# Patient Record
Sex: Female | Born: 1935 | Race: White | Hispanic: No | Smoking: Former smoker
Health system: Southern US, Community
[De-identification: ages and names within clinical notes are randomized; demographics above are authoritative.]

## PROBLEM LIST (undated history)

## (undated) DIAGNOSIS — E119 Type 2 diabetes mellitus without complications: Secondary | ICD-10-CM

## (undated) DIAGNOSIS — I739 Peripheral vascular disease, unspecified: Secondary | ICD-10-CM

## (undated) DIAGNOSIS — I10 Essential (primary) hypertension: Secondary | ICD-10-CM

## (undated) DIAGNOSIS — E079 Disorder of thyroid, unspecified: Secondary | ICD-10-CM

## (undated) DIAGNOSIS — R0602 Shortness of breath: Secondary | ICD-10-CM

## (undated) DIAGNOSIS — I209 Angina pectoris, unspecified: Secondary | ICD-10-CM

## (undated) DIAGNOSIS — I509 Heart failure, unspecified: Secondary | ICD-10-CM

## (undated) DIAGNOSIS — B029 Zoster without complications: Secondary | ICD-10-CM

## (undated) DIAGNOSIS — R413 Other amnesia: Secondary | ICD-10-CM

## (undated) DIAGNOSIS — E785 Hyperlipidemia, unspecified: Secondary | ICD-10-CM

## (undated) DIAGNOSIS — C801 Malignant (primary) neoplasm, unspecified: Secondary | ICD-10-CM

## (undated) DIAGNOSIS — I2510 Atherosclerotic heart disease of native coronary artery without angina pectoris: Secondary | ICD-10-CM

## (undated) DIAGNOSIS — M1990 Unspecified osteoarthritis, unspecified site: Secondary | ICD-10-CM

## (undated) DIAGNOSIS — E214 Other specified disorders of parathyroid gland: Secondary | ICD-10-CM

## (undated) DIAGNOSIS — E039 Hypothyroidism, unspecified: Secondary | ICD-10-CM

## (undated) DIAGNOSIS — N186 End stage renal disease: Secondary | ICD-10-CM

## (undated) DIAGNOSIS — G4730 Sleep apnea, unspecified: Secondary | ICD-10-CM

## (undated) DIAGNOSIS — I639 Cerebral infarction, unspecified: Secondary | ICD-10-CM

## (undated) DIAGNOSIS — I219 Acute myocardial infarction, unspecified: Secondary | ICD-10-CM

## (undated) DIAGNOSIS — R51 Headache: Secondary | ICD-10-CM

## (undated) HISTORY — DX: Essential (primary) hypertension: I10

## (undated) HISTORY — DX: Unspecified osteoarthritis, unspecified site: M19.90

## (undated) HISTORY — DX: Type 2 diabetes mellitus without complications: E11.9

## (undated) HISTORY — DX: Heart failure, unspecified: I50.9

## (undated) HISTORY — PX: OTHER SURGICAL HISTORY: SHX169

## (undated) HISTORY — PX: APPENDECTOMY: SHX54

## (undated) HISTORY — PX: CATARACT EXTRACTION: SUR2

## (undated) HISTORY — DX: End stage renal disease: N18.6

## (undated) HISTORY — PX: CARDIAC CATHETERIZATION: SHX172

## (undated) HISTORY — PX: EYE SURGERY: SHX253

## (undated) HISTORY — DX: Hypercalcemia: E83.52

## (undated) HISTORY — DX: Hyperlipidemia, unspecified: E78.5

## (undated) HISTORY — DX: Acute myocardial infarction, unspecified: I21.9

## (undated) HISTORY — DX: Disorder of thyroid, unspecified: E07.9

---

## 2003-08-30 HISTORY — PX: CHOLECYSTECTOMY: SHX55

## 2003-08-30 HISTORY — PX: ABDOMINAL HYSTERECTOMY: SHX81

## 2003-08-30 HISTORY — PX: NEPHRECTOMY: SHX65

## 2004-05-08 ENCOUNTER — Inpatient Hospital Stay (HOSPITAL_COMMUNITY): Admission: AD | Admit: 2004-05-08 | Discharge: 2004-05-14 | Payer: Self-pay | Admitting: *Deleted

## 2010-06-05 ENCOUNTER — Inpatient Hospital Stay (HOSPITAL_COMMUNITY): Admission: EM | Admit: 2010-06-05 | Discharge: 2010-06-07 | Payer: Self-pay | Admitting: Internal Medicine

## 2010-10-28 DIAGNOSIS — I219 Acute myocardial infarction, unspecified: Secondary | ICD-10-CM

## 2010-10-28 HISTORY — DX: Acute myocardial infarction, unspecified: I21.9

## 2010-10-28 HISTORY — PX: CORONARY STENT PLACEMENT: SHX1402

## 2010-11-10 LAB — BASIC METABOLIC PANEL
BUN: 34 mg/dL — ABNORMAL HIGH (ref 6–23)
BUN: 51 mg/dL — ABNORMAL HIGH (ref 6–23)
Calcium: 8.5 mg/dL (ref 8.4–10.5)
Calcium: 9.8 mg/dL (ref 8.4–10.5)
GFR calc Af Amer: 20 mL/min — ABNORMAL LOW (ref >60)
GFR calc Af Amer: 21 mL/min — ABNORMAL LOW (ref >60)
GFR calc non Af Amer: 17 mL/min — ABNORMAL LOW (ref >60)
GFR calc non Af Amer: 17 mL/min — ABNORMAL LOW (ref >60)
GFR calc non Af Amer: 25 mL/min — ABNORMAL LOW (ref >60)
Glucose, Bld: 136 mg/dL — ABNORMAL HIGH (ref 70–99)
Potassium: 4 mEq/L (ref 3.5–5.1)
Potassium: 4.6 mEq/L (ref 3.5–5.1)
Sodium: 137 mEq/L (ref 135–145)

## 2010-11-10 LAB — GLUCOSE, CAPILLARY
Glucose-Capillary: 117 mg/dL — ABNORMAL HIGH (ref 70–99)
Glucose-Capillary: 207 mg/dL — ABNORMAL HIGH (ref 70–99)
Glucose-Capillary: 258 mg/dL — ABNORMAL HIGH (ref 70–99)
Glucose-Capillary: 260 mg/dL — ABNORMAL HIGH (ref 70–99)
Glucose-Capillary: 264 mg/dL — ABNORMAL HIGH (ref 70–99)

## 2010-11-10 LAB — LIPID PANEL
Cholesterol: 115 mg/dL (ref 0–200)
HDL: 37 mg/dL — ABNORMAL LOW (ref >39)
LDL Cholesterol: 54 mg/dL (ref 0–99)
Total CHOL/HDL Ratio: 3.1 RATIO
Triglycerides: 122 mg/dL (ref <150)

## 2010-11-10 LAB — CARDIAC PANEL(CRET KIN+CKTOT+MB+TROPI)
CK, MB: 1.1 ng/mL (ref 0.3–4.0)
CK, MB: 1.2 ng/mL (ref 0.3–4.0)
CK, MB: 2.1 ng/mL (ref 0.3–4.0)
Relative Index: INVALID (ref 0.0–2.5)
Total CK: 50 U/L (ref 7–177)
Total CK: 60 U/L (ref 7–177)

## 2010-11-10 LAB — CBC
HCT: 37.6 % (ref 36.0–46.0)
MCH: 29.4 pg (ref 26.0–34.0)
MCHC: 33.2 g/dL (ref 30.0–36.0)
Platelets: 144 10*3/uL — ABNORMAL LOW (ref 150–400)
Platelets: 159 10*3/uL (ref 150–400)
RBC: 4.38 MIL/uL (ref 3.87–5.11)
RDW: 14.3 % (ref 11.5–15.5)
RDW: 14.3 % (ref 11.5–15.5)
WBC: 7.8 10*3/uL (ref 4.0–10.5)

## 2010-11-10 LAB — TSH: TSH: 5.108 u[IU]/mL — ABNORMAL HIGH (ref 0.350–4.500)

## 2010-11-10 LAB — URINE CULTURE
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
## 2011-01-14 NOTE — Discharge Summary (Signed)
[name], [name] NO.:  [id number]   MEDICAL RECORD NO.:  [id number]          PATIENT TYPE:  INP   LOCATION:  2905                         FACILITY:  [facility]   PHYSICIAN:  Vida Roller, M.D.   DATE OF BIRTH:  1935-07-25   DATE OF ADMISSION:  05/08/2004  DATE OF DISCHARGE:  05/14/2004                                 DISCHARGE SUMMARY   DISCHARGE DIAGNOSES:  Takotsubo syndrome/coronary artery disease.   INTERVENTIONAL CARDIOLOGIST:  Dr. Dow Adolph MEDICATIONS:  1.  Aspirin 325 mg daily.  2.  Lipitor 40 mg daily.  3.  Glyburide 5 mg daily.  4.  Plavix 75 mg daily.  5.  Isosorbide mononitrate 50 mg daily.  6.  Lopressor 25 mg t.i.d.  7.  Tylenol 325 mg one or two tablets q.4-6h. as required.  8.  Xanax 0.25 mg twice as required.  9.  Lantus insulin 20 a.m. in the morning and 30 units in the evening.   PROBLEM LIST:  1.  Takotsubo syndrome.  2.  Coronary artery disease.  3.  Diabetes.  4.  Hypertension.  5.  History of kidney cancer status post left nephrectomy in 2003.  6.  History of hysterectomy for carcinoma uterus.  Status post hysterectomy      in 2003.  7.  History of cholecystectomy 2003.  8.  History of appendectomy.  9.  Peripheral vascular disease.   PROCEDURE:  Patient was catheterized first time on September 10.  She got  left heart catheterization with selective coronary arteriography and  selective left ventriculography.  Conclusion of first catheterization was  significant wall motion abnormality involving the distal anterior,  anteroapical, and distal inferior segments, bifurcational disease involving  the left anterior descending diagonal 2 and left anterior descending artery.  Patient was recatheterized on September 14.  The coronary artery disease was  persistent with improvement in wall motion abnormality and improved ejection  fraction from 45 on the previous to 57.  Patient got 2-D echocardiogram on  September 15.   Ejection fraction 55-65%, improved wall motion abnormality.   COURSE DURING ADMISSION IN THE HOSPITAL:  Patient presented with complaint  of chest discomfort for two days and associated with shortness of breath,  diaphoresis, dizziness, extreme fatigability, orthopnea the previous night  of admission.  Also, patient had history of nausea, vomiting, and diarrhea  one day prior to admission.  She was transferred by Dr. Sherlyn Lick for urgent  evaluation.  Patient had suffered some recent emotional trauma and was  admitted with chest pain with positive enzymes.  EKG consistent with T-wave  in the anterolateral and inferolateral leads and echocardiogram was  consistent with severe LV dysfunction with only the base moving well.  She  was considered to have post infarction angina with recurrent chest pain.  The BNP was significantly elevated and was referred for further evaluation  to [facility]   LABORATORIES:  On admission troponin was 1.65.  BNP 1120.  Sodium 137,  potassium 4.2, chloride 109, bicarbonate 18, BUN 46, creatinine 1.6, sugar  150, calcium 9.9.  Hemoglobin 13.3, WBC 12.7.  SGOT 47, SGPT 69, albumin  3.4.   ASSESSMENT/PLAN:  Coronary artery disease.  The patient had very large wall  motion abnormality.  This wall motion abnormality seems to be some extent  larger than the vascular territory supplied by the left anterior descending  diagonal distribution.  Nonetheless, it was possible that this could be  responsible.  Also, the patient had history of emotional distress so the  patient was diagnosed with Takotsubo, that is, stress induced cardiomyopathy  where there is vasospasm of coronary vessels.  Patient got recatheter study  on September 14 which showed improved wall motion abnormality, improved  ejection fraction.  Her catheterization films were discussed with Dr. Dorethea Clan  and Dr. Dorris Fetch and there was unanimous decision to treat her medically.  It was assumed that she did  not need PCI or bypass graft right now.  Medical  treatment was optimized and patient was discharged to follow up with Dr.  Sherlyn Lick next week on September 22 and with Dr. Steva Ready the following week.      Jeffr   SP/MEDQ  D:  05/14/2004  T:  05/16/2004  Job:  [id number]   cc:   Sharyne Peach  [address]  [place]  [place] [address]  Fax: [phone]   Harl Bowie, M.D.  [address]  [place]  [place] [address]  Fax: [phone]   Arturo Morton. Riley Kill, M.D. [facility]

## 2011-01-14 NOTE — Cardiovascular Report (Signed)
NAMEGREYDIS, STLOUIS                        ACCOUNT NO.:  [id number]   MEDICAL RECORD NO.:  [id number]                   PATIENT TYPE:  INP   LOCATION:  2926                                 FACILITY:  [facility]   PHYSICIAN:  Arturo Morton. Riley Kill, M.D. [facility]         DATE OF BIRTH:  Aug 26, 1935   DATE OF PROCEDURE:  05/12/2004  DATE OF DISCHARGE:                              CARDIAC CATHETERIZATION   INDICATIONS:  Ms. [name] is a delightful 76 year old woman who presented  with chest pain after severe emotional distress.  She had severe reduction  in left ventricular function leading to transfer for catheterization with  recurrent angina.  Catheterization revealed a moderate bifurcational  stenosis of the mid left anterior descending artery at the second diagonal  takeoff and a fairly large wall motion abnormality with calculated ejection  fraction of 45% at that point.  There were marked diffuse T-wave inversions.  Repeat cardiac catheterization was recommended at that time.  We were  uncertain as to whether this represented a specific focal wall motion  abnormality in the LAD diagonal territory or whether, in fact, it suggested  more of a Takayasu presentation.  Given the clinical scenario and the size  of the wall motion abnormality, we were clearly concerned about Takayasu as  a primary etiologic cause.  We elected to treat her medically.  She has done  well.  She was brought back to the catheterization laboratory to reevaluate  the LAD.  We had her seen by the cardiovascular surgeons with a specific eye  towards possible revascularization if that would be necessary.  The restudy  was done to reassess the LAD and the wall motion at this point in time.   PROCEDURE:  1.  Left heart catheterization.  2.  Selective coronary arteriography.  3.  Selective left ventriculography.   DESCRIPTION OF PROCEDURE:  The patient was brought to the catheterization  laboratory and prepped and draped in  the usual fashion.  Through an anterior  puncture the left femoral artery was entered.  A 5-French sheath was placed.  Central aortic and left ventricular pressures were measured with a pigtail.  Ventriculography was performed in the RAO projection.  Coronary arteriograms  were performed of the left coronary artery.  Intracoronary nitroglycerin was  then administered.  All catheters were subsequently removed.  The patient  was taken to the holding area in satisfactory clinical condition.  I then  reviewed the films with Dr. Nanetta Batty.  Initial views suggested that  the LAD itself was 60-70% as well as the diagonal.  The wall motion itself  was clearly improved with an ejection fraction now calculated at 57% but  there continues to be distal inferior hypokinesis with what appears to be  beyond the territory of the LAD alone.  I plan to review these with the  cardiovascular surgeons, specifically, Dr. Dorris Fetch.   HEMODYNAMIC DATA:  1.  Central aorta 177/71.  2.  Left ventricle 160/26.  3.  No gradient on pullback across the aortic valve.   ANGIOGRAPHIC DATA:  1.  Ventriculography was performed in the RAO projection.  There did not      appear to be significant mitral regurgitation.  EF was calculated at      57%.  2.  The left main coronary artery was free of critical disease.  3.  The LAD is fairly heavily calcified proximally with some mild plaquing      throughout the proximal LAD.  At the second LAD diagonal bifurcation      there is a specific area of abnormality involving both branches.      However, both appear to have a relatively smooth appearance with no more      than about 60-70% narrowing.  It is difficult to speculate that the LAD      itself accounts for the wall motion abnormality.  The distal LAD and      diagonal are both moderate sized vessels.  4.  The circumflex provides several marginal branches and has mild plaquing      in the mid vessel which is about  30%.  This is eccentric and basically      unchanged from the acute study.  The distal marginal is fairly large      caliber vessel.  5.  Right coronary arteriography was not performed as it was not felt to be      necessary.   CONCLUSIONS:  1.  Compared to the previous study wall motion is improved, but still      involves the distal inferior wall.  2.  There is bifurcational disease of the left anterior descending and      diagonal branch but it appears to be 60 to perhaps 70.  It is not clear      that this represents flow-limiting disease.   DISPOSITION:  I plan to review the films carefully with Dr. Dorris Fetch  before making a final disposition.  Her heparin will be discontinued.  She  will be treated medically at the present time.  Further decision will be  pending review with the surgeons.                                               Arturo Morton. Riley Kill, M.D. [facility]    TDS/MEDQ  D:  05/12/2004  T:  05/12/2004  Job:  [id number]   cc:   Sharyne Peach  [address]  [place]  [place] [address]  Fax: [phone]   Harl Bowie, M.D.  [address]  [place]  [place] [address]  Fax: [phone]   CV Lab

## 2011-01-14 NOTE — Cardiovascular Report (Signed)
NAMESCHERRIE, [name]                        ACCOUNT NO.:  [id number]   MEDICAL RECORD NO.:  [id number]                   PATIENT TYPE:  INP   LOCATION:  2926                                 FACILITY:  [facility]   PHYSICIAN:  Arturo Morton. Riley Kill, M.D. [facility]         DATE OF BIRTH:  [birth year]   DATE OF PROCEDURE:  DATE OF DISCHARGE:                              CARDIAC CATHETERIZATION   DATE OF SERVICE:  May 08, 2004.   SURGEON:  Arturo Morton. Riley Kill, MD, [facility].   INDICATIONS:  Ms. [name] is a 76 year old woman transferred by Dr. Sherlyn Lick  for urgent evaluation.  The patient suffered some recent emotional trauma  and then was admitted with chest pain and positive enzymes.  EKG was  consistent with T-wave inversion in the anterolateral and inferolateral  leads, and echo was consistent with severe LV dysfunction with only the base  moving well.  She was considered to have post-infarction angina with  recurrent chest pain, the BNP was significantly elevated, and she was  referred for further evaluation.   PROCEDURE:  1.  Left heart catheterization.  2.  Selective coronary arteriography.  3.  Selective left ventriculography.   DESCRIPTION OF PROCEDURE:  The patient was brought to the cath lab and  prepped and draped in the usual fashion.  Through an anterior puncture, the  right femoral artery was easily entered.  A 6-French sheath was placed.  Plans were made to greatly limit the amount of contrast.  We took 2 views of  the right coronary artery and then views of the left coronary artery.  There  was LAD D2 bifurcational disease.  Whether this was significant was  difficult to determine, so I elected to perform a ventriculogram.  The  ejection fraction that had been reported at 20% by echocardiography seemed  out of proportion to the findings by angiography, so both RAO and LAO  ventriculography were done with 35 mL total.  This demonstrated a large wall  motion abnormality.  Ejection  fraction was calculated at 45%.  There was mid  and distal, anterolateral, apical, and distal inferior akinesis, which  appeared to be, to some extent, out of proportion to the LAD diagonal  bifurcational disease.  With this, the procedure was completed.  The femoral  sheath was sewn into place.  The patient was taken to the CCU in  satisfactory clinical condition for continued monitoring.   HEMODYNAMIC DATA:  1.  Aortic 114/74.  2.  Left ventricle 106/4.  3.  No gradient on pullback across the aortic valve.   ANGIOGRAPHIC DATA:  1.  On plain fluoroscopy, there is evidence of some calcification involving      largely the left anterior descending artery.  2.  Ventriculography was performed in the RAO projection.  The mid distal,      anterolateral, apical, and distal inferior wall appeared to move poorly.      There was hyperdynamic motion of  the anterior base and inferior base.      Ejection fraction was calculated, therefore, at 45%.  There was perhaps      trace mitral regurgitation, but it did not appear to be severe.  In the      LAO ventriculogram, there was a large, global area mostly involving the      inferoapical segment.  3.  The right coronary artery was a large, dominant vessel with segmental      plaquing of about 30% of the proximal vessel.  It is a very large      caliber vessel supplying a number of sub-branches of the posterior      descending artery and a fair-sized posterolateral branch.  4.  The left main is free of critical disease.  5.  The circumflex consists of two marginal branches, and the circumflex      proper is really free of significant disease.  6.  The left anterior descending artery provides a proximal diagonal branch,      which takes the course of an intermediate vessel.  This intermediate      vessel has mild luminal irregularity, but no evidence of high-grade      focal stenosis.  Following this, there is a lesion that involves the LAD       diagonal 2 bifurcation.  There is not much disease prior to the      bifurcation, but at the bifurcation both vessels have about 70-75%      narrowing.  There is TIMI-3 flow in the distal vessel.   CONCLUSIONS:  1.  Significant wall motion abnormality involving the distal anterior,      anteroapical, and distal inferior segments.  2.  Bifurcational disease involving the LAD diagonal 2 and LAD.   DISCUSSION:  The patient has a very large wall motion abnormality.  This  wall motion abnormality seems to be, to some extent, larger than the  vascular territory supplied by the LAD diagonal 2 distribution.  Nonetheless, it is possible that this could be responsible, although I doubt  this is the case.  Furthermore, the patient provides a history of rather  severe emotional distress.  This could represent a __________ presentation  with a large anteroapical wall motion abnormality in the setting of  emotional distress.  At the present time, we will monitor her closely in the  CCU.  Importantly, the patient's creatinine is elevated, we used 61 mL of  total contrast, her LVEDP is low, and we will give her normal saline bolus  to try to reduce post-procedural change in renal function.  If she does  well, we will potentially plan to study her later in the week to see if the  LAD diagonal is less severe, and also to see if, in fact, the anterior wall  has recovered.  I have asked Dr. Andrey Spearman of the surgical service  to see her as she may need revascularization surgery with the LAD diagonal  disease.  We will get his opinion, and we will follow her closely.                                               Arturo Morton. Riley Kill, M.D. [facility]    TDS/MEDQ  D:  05/08/2004  T:  05/09/2004  Job:  [id number]   cc:   Jacklynn Lewis  Sandhu  [address]  [place]  [place] [address]  Fax: [phone]   Harl Bowie, M.D.  [address]  [place]  [place] [address]  Fax: [phone]   CV Laboratory

## 2011-01-14 NOTE — Consult Note (Signed)
NAMECHISTINE, DEMATTEO                        ACCOUNT NO.:  [id number]   MEDICAL RECORD NO.:  [id number]                   PATIENT TYPE:  INP   LOCATION:  2926                                 FACILITY:  [facility]   PHYSICIAN:  Salvatore Decent. Dorris Fetch, M.D.         DATE OF BIRTH:  1935/07/08   DATE OF CONSULTATION:  05/09/2004  DATE OF DISCHARGE:                                   CONSULTATION   REASON FOR CONSULTATION:  Coronary artery disease.   HISTORY OF PRESENT ILLNESS:  Ms. Mcclaran is a 76 year old white female who  presented with a complaint of chest pain.  She had been having symptoms on  and off for two days prior to presentation to her doctor who sent her  immediately to the emergency room.  There she was noted to have diffuse ST  changes and ruled in for myocardial infarction by enzymes.  An  echocardiogram performed there showed severe hypokinesis to akinesis in the  left ventricle with exception of the base of the heart.  She also had mild  mitral regurgitation and mild pulmonary hypertension.  Her peak troponin was  1.95.  She was transferred to [facility] for cardiac catheterization which  showed only moderate disease in the LAD and second diagonal branches of 70  to 75%. These were very smooth lesions. The LAD itself was relatively small  vessel and did not reach the apex of the heart.  The large dominant right  and left circumflex had no significant disease.   PAST MEDICAL HISTORY:  1.  Adult-onset type 2 diabetes mellitus.  2.  Hypertension.  3.  Hypercholesterolemia.  4.  History of left nephrectomy for renal cell carcinoma.  5.  History of hysterectomy.  6.  History of cholecystectomy.  7.  History of appendectomy.   MEDICATIONS AT TIME OF ADMISSION:  1.  Metformin 1000 mg p.o. b.i.d.  2.  Glucophage 10 mg every other day.  3.  Lantus insulin.  4.  Metoprolol 50 mg p.o. b.i.d.  5.  Lisinopril 10 mg daily.  6.  Oxybutynin 5 mg b.i.d.  7.  Lipitor 40 mg  daily.   ALLERGIES:  No known drug allergies.   FAMILY HISTORY:  Significant for coronary artery disease at an early age in  mother, brother, and sister.   SOCIAL HISTORY:  She has a 70-pack-year history, smoking two packs per day,  quit 15 years ago,   PHYSICAL EXAMINATION:  GENERAL:  Ms. Koop is a well-appearing 69-year-  old white female who is currently in no acute distress.  She is well-  developed, well-nourished, and mildly obese.  VITAL SIGNS: Blood pressure 110/48, pulse 70 and regular, respirations 16.  NEUROLOGIC:  Alert and oriented x 3 and grossly intact.  HEENT:  Unremarkable.  CARDIAC:  Regular rate and rhythm, normal S1 and S2.  No rub or murmur.  LUNGS: Clear with equal breath sounds bilaterally.  ABDOMEN:  Soft, nontender.  EXTREMITIES:  Without clubbing, cyanosis, or edema, 2+ pulses throughout.  SKIN:  Warm and dry.   LABORATORY AND X-RAY DATA:  Echocardiogram and catheterization as previously  mentioned.   Hematocrit 32, white count 7.7.  Sodium 138, potassium 4.3, BUN 34,  creatinine 1.6, glucose 173.  Peak CK-MB was 9.5.  Peak troponin was 1.95.  Urinalysis was normal.  BNP on admission was 1120.   IMPRESSION:  Ms. Flippo is a 76 year old lady who presented with an acute  myocardial infarction.  Her electrocardiographic changes were very diffuse,  and her echocardiogram showed severe wall motion abnormalities out of  proportion to her coronary artery disease.  She really only has moderate  disease in her left anterior descending and diagonal, neither of which are  huge vessels.  There is no significant disease in the right coronary artery  or left circumflex.   I am in agreement with Dr. Riley Kill that her clinical picture is not  consistent with these catheterization findings which raises concern of a  vasospastic or arteritis type of phenomenon.  I agree with his plan for re-  catheterization, and will follow this patient with him.                                                Salvatore Decent Dorris Fetch, M.D.    SCH/MEDQ  D:  05/09/2004  T:  05/09/2004  Job:  [id number]   cc:   Arturo Morton. Riley Kill, M.D. [facility]

## 2011-11-11 ENCOUNTER — Other Ambulatory Visit (HOSPITAL_COMMUNITY): Payer: Self-pay | Admitting: Endocrinology

## 2011-11-11 DIAGNOSIS — E213 Hyperparathyroidism, unspecified: Secondary | ICD-10-CM

## 2011-11-24 ENCOUNTER — Encounter (HOSPITAL_COMMUNITY): Payer: Self-pay

## 2012-12-07 DIAGNOSIS — I509 Heart failure, unspecified: Secondary | ICD-10-CM

## 2012-12-07 HISTORY — DX: Heart failure, unspecified: I50.9

## 2012-12-12 ENCOUNTER — Other Ambulatory Visit: Payer: Self-pay | Admitting: *Deleted

## 2012-12-12 DIAGNOSIS — N184 Chronic kidney disease, stage 4 (severe): Secondary | ICD-10-CM

## 2012-12-12 DIAGNOSIS — Z01810 Encounter for preprocedural cardiovascular examination: Secondary | ICD-10-CM

## 2012-12-13 ENCOUNTER — Encounter: Payer: Self-pay | Admitting: Vascular Surgery

## 2013-01-07 ENCOUNTER — Encounter: Payer: Self-pay | Admitting: Vascular Surgery

## 2013-01-08 ENCOUNTER — Encounter: Payer: Self-pay | Admitting: Vascular Surgery

## 2013-01-08 ENCOUNTER — Encounter (INDEPENDENT_AMBULATORY_CARE_PROVIDER_SITE_OTHER): Payer: Medicare Other | Admitting: *Deleted

## 2013-01-08 ENCOUNTER — Other Ambulatory Visit: Payer: Self-pay

## 2013-01-08 ENCOUNTER — Ambulatory Visit (INDEPENDENT_AMBULATORY_CARE_PROVIDER_SITE_OTHER): Payer: Medicare Other | Admitting: Vascular Surgery

## 2013-01-08 ENCOUNTER — Encounter (HOSPITAL_COMMUNITY): Payer: Self-pay | Admitting: Pharmacy Technician

## 2013-01-08 VITALS — BP 177/65 | HR 60 | Resp 14 | Ht 61.0 in | Wt 203.0 lb

## 2013-01-08 DIAGNOSIS — N184 Chronic kidney disease, stage 4 (severe): Secondary | ICD-10-CM

## 2013-01-08 DIAGNOSIS — Z01810 Encounter for preprocedural cardiovascular examination: Secondary | ICD-10-CM

## 2013-01-08 DIAGNOSIS — N186 End stage renal disease: Secondary | ICD-10-CM

## 2013-01-08 MED ORDER — DEXTROSE 5 % IV SOLN
1.5000 g | INTRAVENOUS | Status: AC
  Administered 2013-01-09: 1.5 g via INTRAVENOUS
  Filled 2013-01-08: qty 1.5

## 2013-01-08 NOTE — Progress Notes (Signed)
I was not able to reach patient.I left instructions on her voice mail for arrival time of 0700 , [facility] Short Stay, Entrance A.  Npo after midnight .  Meds to take with water in AM:  Norvasc, Neurotin, Toprol , Imdur, Ranexa, Synthyroid, Allopurinol.  No lotions, powders, cologne, no valuables.  Number to call for questions- [phone].

## 2013-01-08 NOTE — Progress Notes (Signed)
Subjective:     Patient ID: [name], female   DOB: 08-Jul-1935, 78 y.o.   MRN: [id number]  HPI this 78 year old female was referred by Dr. Elvis Coil for evaluation of vascular access. She has diabetes mellitus. She is right-handed. She has never had hemodialysis in the past. She did have a cardiac stent placed one year ago. She denies any active chest pain.  Past Medical History  Diagnosis Date  . Diabetes mellitus without complication   . ESRD (end stage renal disease)     Renal insufficiency  . Hypertension   . Heart failure December 07, 2012    Right side  . Hypercalcemia     2nd to hyperparathyroidism  . Arthritis     Gout  . Myocardial infarction   . Thyroid disease     Hyperparathyroidism  . Thyroid disease     Hypothyroidism  . Dyslipidemia   . CHF (congestive heart failure)     History  Substance Use Topics  . Smoking status: Former Smoker    Types: Cigarettes    Quit date: 01/08/1998  . Smokeless tobacco: Never Used  . Alcohol Use: No    Family History  Problem Relation Age of Onset  . Diabetes Sister   . Heart disease Sister     before age 48  . Hyperlipidemia Sister   . Hypertension Sister   . Diabetes Sister   . Hyperlipidemia Sister   . Hypertension Sister     Allergies not on file  Current outpatient prescriptions:albuterol (PROVENTIL) (2.5 MG/3ML) 0.083% nebulizer solution, Take 2.5 mg by nebulization every 6 (six) hours as needed for wheezing., Disp: , Rfl: ;  allopurinol (ZYLOPRIM) 100 MG tablet, Take 100 mg by mouth daily., Disp: , Rfl: ;  amLODipine (NORVASC) 2.5 MG tablet, Take 2.5 mg by mouth daily., Disp: , Rfl: ;  aspirin 81 MG tablet, Take 81 mg by mouth daily., Disp: , Rfl:  atorvastatin (LIPITOR) 80 MG tablet, Take 80 mg by mouth daily., Disp: , Rfl: ;  clotrimazole-betamethasone (LOTRISONE) cream, Apply topically continuous as needed., Disp: , Rfl: ;  ergocalciferol (VITAMIN D2) 50000 UNITS capsule, Take 50,000 Units by mouth once a  week., Disp: , Rfl: ;  febuxostat (ULORIC) 40 MG tablet, Take 80 mg by mouth daily., Disp: , Rfl: ;  furosemide (LASIX) 20 MG tablet, Take 20 mg by mouth daily., Disp: , Rfl:  gabapentin (NEURONTIN) 400 MG capsule, Take 400 mg by mouth 3 (three) times daily., Disp: , Rfl: ;  insulin aspart (NOVOLOG) 100 UNIT/ML injection, Inject into the skin 2 (two) times daily. 10 units am - 20 units pm, Disp: , Rfl: ;  Insulin Glargine (LANTUS [place]), Inject 60 Units into the skin at bedtime., Disp: , Rfl: ;  isosorbide mononitrate (IMDUR) 60 MG 24 hr tablet, Take 60 mg by mouth daily., Disp: , Rfl:  Levothyroxine Sodium 75 MCG CAPS, Take by mouth daily before breakfast., Disp: , Rfl: ;  metoprolol succinate (TOPROL-XL) 50 MG 24 hr tablet, Take 50 mg by mouth daily. Take with or immediately following a meal., Disp: , Rfl: ;  nitroGLYCERIN (NITROSTAT) 0.3 MG SL tablet, Place 0.3 mg under the tongue continuous as needed for chest pain., Disp: , Rfl: ;  Omega-3 Fatty Acids (FISH OIL) 1200 MG CAPS, Take 1,200 mg by mouth daily., Disp: , Rfl:  Oxybutynin (OXYTROL TD), Place onto the skin every three (3) days as needed. Every 4 days patch, Disp: , Rfl: ;  ranolazine (RANEXA)  500 MG 12 hr tablet, Take 500 mg by mouth 2 (two) times daily., Disp: , Rfl: ;  solifenacin (VESICARE) 5 MG tablet, Take 10 mg by mouth daily., Disp: , Rfl: ;  Ticagrelor (BRILINTA PO), Take 90 mg by mouth 2 (two) times daily., Disp: , Rfl:   BP   Ht 5\' 1"  (1.549 m)  Wt 203 lb (92.08 kg)  BMI 38.38 kg/m2  Body mass index is 38.38 kg/(m^2).          Review of Systems denies chest pain, dyspnea on exertion, PND, orthopnea. His have distal edema bilaterally. Takes Brilinta twice a day for cardiac stent     Objective:   Physical Exam BP   Ht 5\' 1"  (1.549 m)  Wt 203 lb (92.08 kg)  BMI 38.38 kg/m2 Blood pressure 177/65 heart rate 60 respirations 14 Gen.-alert and oriented x3 in no apparent distress HEENT normal for age Lungs no rhonchi or  wheezing Cardiovascular regular rhythm no murmurs carotid pulses 3+ palpable no bruits audible Abdomen soft nontender no palpable masses Musculoskeletal free of  major deformities Skin clear -no rashes Neurologic normal Lower extremities 3+ femoral pulses bilaterally. No distal pulses palpable. 1+ edema bilaterally. Bilateral upper extremities with 3+ brachial and radial pulses palpable. Left antecubital vein visible.  Today I ordered bilateral upper extremity vein mapping which I reviewed and interpreted. It does appear that the left cephalic and basilic veins are satisfactory for fistula creation. I independently performed a sono site and side ultrasound exam confirmed the above findings      Assessment:     End-stage renal disease needs vascular access-right-handed    Plan:     Plan left brachial to cephalic A-V fistula tomorrow Will hold Brilinta today and tomorrow and then resume on Thursday

## 2013-01-09 ENCOUNTER — Encounter (HOSPITAL_COMMUNITY): Payer: Self-pay | Admitting: *Deleted

## 2013-01-09 ENCOUNTER — Encounter (HOSPITAL_COMMUNITY): Payer: Self-pay | Admitting: Anesthesiology

## 2013-01-09 ENCOUNTER — Ambulatory Visit (HOSPITAL_COMMUNITY): Payer: Medicare Other

## 2013-01-09 ENCOUNTER — Ambulatory Visit (HOSPITAL_COMMUNITY): Payer: Medicare Other | Admitting: Anesthesiology

## 2013-01-09 ENCOUNTER — Ambulatory Visit (HOSPITAL_COMMUNITY)
Admission: RE | Admit: 2013-01-09 | Discharge: 2013-01-09 | Disposition: A | Discharge status: Home / Self Care | Payer: Medicare Other | Source: Ambulatory Visit | Attending: Vascular Surgery | Admitting: Vascular Surgery

## 2013-01-09 ENCOUNTER — Encounter (HOSPITAL_COMMUNITY)
Admission: RE | Disposition: A | Discharge status: Home / Self Care | Payer: Self-pay | Source: Ambulatory Visit | Attending: Vascular Surgery

## 2013-01-09 ENCOUNTER — Other Ambulatory Visit: Payer: Self-pay | Admitting: *Deleted

## 2013-01-09 ENCOUNTER — Telehealth: Payer: Self-pay | Admitting: Vascular Surgery

## 2013-01-09 DIAGNOSIS — E785 Hyperlipidemia, unspecified: Secondary | ICD-10-CM | POA: Insufficient documentation

## 2013-01-09 DIAGNOSIS — Z794 Long term (current) use of insulin: Secondary | ICD-10-CM | POA: Insufficient documentation

## 2013-01-09 DIAGNOSIS — Z87891 Personal history of nicotine dependence: Secondary | ICD-10-CM | POA: Insufficient documentation

## 2013-01-09 DIAGNOSIS — E039 Hypothyroidism, unspecified: Secondary | ICD-10-CM | POA: Insufficient documentation

## 2013-01-09 DIAGNOSIS — I509 Heart failure, unspecified: Secondary | ICD-10-CM | POA: Insufficient documentation

## 2013-01-09 DIAGNOSIS — N186 End stage renal disease: Secondary | ICD-10-CM

## 2013-01-09 DIAGNOSIS — Z9861 Coronary angioplasty status: Secondary | ICD-10-CM | POA: Insufficient documentation

## 2013-01-09 DIAGNOSIS — Z79899 Other long term (current) drug therapy: Secondary | ICD-10-CM | POA: Insufficient documentation

## 2013-01-09 DIAGNOSIS — I120 Hypertensive chronic kidney disease with stage 5 chronic kidney disease or end stage renal disease: Secondary | ICD-10-CM | POA: Insufficient documentation

## 2013-01-09 DIAGNOSIS — E119 Type 2 diabetes mellitus without complications: Secondary | ICD-10-CM | POA: Insufficient documentation

## 2013-01-09 DIAGNOSIS — Z7982 Long term (current) use of aspirin: Secondary | ICD-10-CM | POA: Insufficient documentation

## 2013-01-09 DIAGNOSIS — I252 Old myocardial infarction: Secondary | ICD-10-CM | POA: Insufficient documentation

## 2013-01-09 DIAGNOSIS — Z6838 Body mass index (BMI) 38.0-38.9, adult: Secondary | ICD-10-CM | POA: Insufficient documentation

## 2013-01-09 DIAGNOSIS — Z4931 Encounter for adequacy testing for hemodialysis: Secondary | ICD-10-CM

## 2013-01-09 DIAGNOSIS — M109 Gout, unspecified: Secondary | ICD-10-CM | POA: Insufficient documentation

## 2013-01-09 DIAGNOSIS — E213 Hyperparathyroidism, unspecified: Secondary | ICD-10-CM | POA: Insufficient documentation

## 2013-01-09 HISTORY — PX: AV FISTULA PLACEMENT: SHX1204

## 2013-01-09 LAB — PROTIME-INR
INR: 0.94 (ref 0.00–1.49)
Prothrombin Time: 12.5 seconds (ref 11.6–15.2)

## 2013-01-09 LAB — POCT I-STAT 4, (NA,K, GLUC, HGB,HCT)
HCT: 41 % (ref 36.0–46.0)
Hemoglobin: 13.9 g/dL (ref 12.0–15.0)

## 2013-01-09 LAB — GLUCOSE, CAPILLARY: Glucose-Capillary: 146 mg/dL — ABNORMAL HIGH (ref 70–99)

## 2013-01-09 SURGERY — ARTERIOVENOUS (AV) FISTULA CREATION
Anesthesia: Monitor Anesthesia Care | Site: Arm Upper | Laterality: Left | Wound class: Clean

## 2013-01-09 MED ORDER — LIDOCAINE-EPINEPHRINE (PF) 1 %-1:200000 IJ SOLN
INTRAMUSCULAR | Status: AC
  Filled 2013-01-09: qty 10

## 2013-01-09 MED ORDER — FENTANYL CITRATE 0.05 MG/ML IJ SOLN
INTRAMUSCULAR | Status: DC | PRN
  Administered 2013-01-09: 50 ug via INTRAVENOUS

## 2013-01-09 MED ORDER — LIDOCAINE-EPINEPHRINE (PF) 1 %-1:200000 IJ SOLN
INTRAMUSCULAR | Status: DC | PRN
  Administered 2013-01-09: 23 mL

## 2013-01-09 MED ORDER — OXYCODONE HCL 5 MG PO TABS: 5.0000 mg | ORAL_TABLET | ORAL | Status: DC | PRN

## 2013-01-09 MED ORDER — TICAGRELOR 90 MG PO TABS: 90.0000 mg | ORAL_TABLET | Freq: Two times a day (BID) | ORAL | Status: DC

## 2013-01-09 MED ORDER — OXYCODONE HCL 5 MG PO TABS: 5.0000 mg | ORAL_TABLET | Freq: Once | ORAL | Status: DC | PRN

## 2013-01-09 MED ORDER — MUPIROCIN 2 % EX OINT
TOPICAL_OINTMENT | CUTANEOUS | Status: AC
  Filled 2013-01-09: qty 22

## 2013-01-09 MED ORDER — MIDAZOLAM HCL 5 MG/5ML IJ SOLN
INTRAMUSCULAR | Status: DC | PRN
  Administered 2013-01-09: 2 mg via INTRAVENOUS

## 2013-01-09 MED ORDER — 0.9 % SODIUM CHLORIDE (POUR BTL) OPTIME
TOPICAL | Status: DC | PRN
  Administered 2013-01-09: 1000 mL

## 2013-01-09 MED ORDER — MUPIROCIN 2 % EX OINT
TOPICAL_OINTMENT | Freq: Two times a day (BID) | CUTANEOUS | Status: DC
  Administered 2013-01-09: 1 via NASAL

## 2013-01-09 MED ORDER — SODIUM CHLORIDE 0.9 % IV SOLN
INTRAVENOUS | Status: DC
  Administered 2013-01-09: 11:00:00 via INTRAVENOUS

## 2013-01-09 MED ORDER — HYDROMORPHONE HCL PF 1 MG/ML IJ SOLN: 0.2500 mg | INTRAMUSCULAR | Status: DC | PRN

## 2013-01-09 MED ORDER — PROPOFOL INFUSION 10 MG/ML OPTIME
INTRAVENOUS | Status: DC | PRN
  Administered 2013-01-09: 50 ug/kg/min via INTRAVENOUS

## 2013-01-09 MED ORDER — OXYCODONE HCL 5 MG/5ML PO SOLN: 5.0000 mg | Freq: Once | ORAL | Status: DC | PRN

## 2013-01-09 MED ORDER — PROMETHAZINE HCL 25 MG/ML IJ SOLN: 6.2500 mg | INTRAMUSCULAR | Status: DC | PRN

## 2013-01-09 MED ORDER — SODIUM CHLORIDE 0.9 % IR SOLN
Status: DC | PRN
  Administered 2013-01-09: 12:00:00

## 2013-01-09 SURGICAL SUPPLY — 35 items
ADH SKN CLS APL DERMABOND .7 (GAUZE/BANDAGES/DRESSINGS) ×1
ARMBAND PINK RESTRICT EXTREMIT (MISCELLANEOUS) ×2 IMPLANT
CANISTER SUCTION 2500CC (MISCELLANEOUS) ×2 IMPLANT
CATH EMB 3FR 80CM (CATHETERS) ×1 IMPLANT
CLIP TI MEDIUM 6 (CLIP) ×2 IMPLANT
CLIP TI WIDE RED SMALL 6 (CLIP) ×3 IMPLANT
CLOTH BEACON ORANGE TIMEOUT ST (SAFETY) ×2 IMPLANT
COVER PROBE W GEL 5X96 (DRAPES) ×1 IMPLANT
COVER SURGICAL LIGHT HANDLE (MISCELLANEOUS) ×2 IMPLANT
DERMABOND ADVANCED (GAUZE/BANDAGES/DRESSINGS) ×1
DERMABOND ADVANCED .7 DNX12 (GAUZE/BANDAGES/DRESSINGS) ×1 IMPLANT
ELECT REM PT RETURN 9FT ADLT (ELECTROSURGICAL) ×2
ELECTRODE REM PT RTRN 9FT ADLT (ELECTROSURGICAL) ×1 IMPLANT
GEL ULTRASOUND 20GR AQUASONIC (MISCELLANEOUS) ×1 IMPLANT
GLOVE BIO SURGEON STRL SZ 6.5 (GLOVE) ×2 IMPLANT
GLOVE BIOGEL PI IND STRL 7.0 (GLOVE) IMPLANT
GLOVE BIOGEL PI INDICATOR 7.0 (GLOVE) ×2
GLOVE SS BIOGEL STRL SZ 6.5 (GLOVE) IMPLANT
GLOVE SS BIOGEL STRL SZ 7 (GLOVE) ×1 IMPLANT
GLOVE SUPERSENSE BIOGEL SZ 6.5 (GLOVE) ×1
GLOVE SUPERSENSE BIOGEL SZ 7 (GLOVE) ×1
GOWN STRL NON-REIN LRG LVL3 (GOWN DISPOSABLE) ×6 IMPLANT
KIT BASIN OR (CUSTOM PROCEDURE TRAY) ×2 IMPLANT
KIT ROOM TURNOVER OR (KITS) ×2 IMPLANT
NS IRRIG 1000ML POUR BTL (IV SOLUTION) ×2 IMPLANT
PACK CV ACCESS (CUSTOM PROCEDURE TRAY) ×2 IMPLANT
PAD ARMBOARD 7.5X6 YLW CONV (MISCELLANEOUS) ×4 IMPLANT
SUT PROLENE 6 0 BV (SUTURE) ×3 IMPLANT
SUT VIC AB 3-0 SH 27 (SUTURE) ×2
SUT VIC AB 3-0 SH 27X BRD (SUTURE) ×1 IMPLANT
SYR TB 1ML LUER SLIP (SYRINGE) ×1 IMPLANT
TOWEL OR 17X24 6PK STRL BLUE (TOWEL DISPOSABLE) ×2 IMPLANT
TOWEL OR 17X26 10 PK STRL BLUE (TOWEL DISPOSABLE) ×2 IMPLANT
UNDERPAD 30X30 INCONTINENT (UNDERPADS AND DIAPERS) ×2 IMPLANT
WATER STERILE IRR 1000ML POUR (IV SOLUTION) ×2 IMPLANT

## 2013-01-09 NOTE — H&P (View-Only) (Signed)
Subjective:     Patient ID: [name], female   DOB: 07/29/1935, 78 y.o.   MRN: [id number]  HPI this 78-year-old female was referred by Dr. Martin Webb for evaluation of vascular access. She has diabetes mellitus. She is right-handed. She has never had hemodialysis in the past. She did have a cardiac stent placed one year ago. She denies any active chest pain.  Past Medical History  Diagnosis Date  . Diabetes mellitus without complication   . ESRD (end stage renal disease)     Renal insufficiency  . Hypertension   . Heart failure December 07, 2012    Right side  . Hypercalcemia     2nd to hyperparathyroidism  . Arthritis     Gout  . Myocardial infarction   . Thyroid disease     Hyperparathyroidism  . Thyroid disease     Hypothyroidism  . Dyslipidemia   . CHF (congestive heart failure)     History  Substance Use Topics  . Smoking status: Former Smoker    Types: Cigarettes    Quit date: 01/08/1998  . Smokeless tobacco: Never Used  . Alcohol Use: No    Family History  Problem Relation Age of Onset  . Diabetes Sister   . Heart disease Sister     before age 60  . Hyperlipidemia Sister   . Hypertension Sister   . Diabetes Sister   . Hyperlipidemia Sister   . Hypertension Sister     Allergies not on file  Current outpatient prescriptions:albuterol (PROVENTIL) (2.5 MG/3ML) 0.083% nebulizer solution, Take 2.5 mg by nebulization every 6 (six) hours as needed for wheezing., Disp: , Rfl: ;  allopurinol (ZYLOPRIM) 100 MG tablet, Take 100 mg by mouth daily., Disp: , Rfl: ;  amLODipine (NORVASC) 2.5 MG tablet, Take 2.5 mg by mouth daily., Disp: , Rfl: ;  aspirin 81 MG tablet, Take 81 mg by mouth daily., Disp: , Rfl:  atorvastatin (LIPITOR) 80 MG tablet, Take 80 mg by mouth daily., Disp: , Rfl: ;  clotrimazole-betamethasone (LOTRISONE) cream, Apply topically continuous as needed., Disp: , Rfl: ;  ergocalciferol (VITAMIN D2) 50000 UNITS capsule, Take 50,000 Units by mouth once a  week., Disp: , Rfl: ;  febuxostat (ULORIC) 40 MG tablet, Take 80 mg by mouth daily., Disp: , Rfl: ;  furosemide (LASIX) 20 MG tablet, Take 20 mg by mouth daily., Disp: , Rfl:  gabapentin (NEURONTIN) 400 MG capsule, Take 400 mg by mouth 3 (three) times daily., Disp: , Rfl: ;  insulin aspart (NOVOLOG) 100 UNIT/ML injection, Inject into the skin 2 (two) times daily. 10 units am - 20 units pm, Disp: , Rfl: ;  Insulin Glargine (LANTUS [place]), Inject 60 Units into the skin at bedtime., Disp: , Rfl: ;  isosorbide mononitrate (IMDUR) 60 MG 24 hr tablet, Take 60 mg by mouth daily., Disp: , Rfl:  Levothyroxine Sodium 75 MCG CAPS, Take by mouth daily before breakfast., Disp: , Rfl: ;  metoprolol succinate (TOPROL-XL) 50 MG 24 hr tablet, Take 50 mg by mouth daily. Take with or immediately following a meal., Disp: , Rfl: ;  nitroGLYCERIN (NITROSTAT) 0.3 MG SL tablet, Place 0.3 mg under the tongue continuous as needed for chest pain., Disp: , Rfl: ;  Omega-3 Fatty Acids (FISH OIL) 1200 MG CAPS, Take 1,200 mg by mouth daily., Disp: , Rfl:  Oxybutynin (OXYTROL TD), Place onto the skin every three (3) days as needed. Every 4 days patch, Disp: , Rfl: ;  ranolazine (RANEXA)   500 MG 12 hr tablet, Take 500 mg by mouth 2 (two) times daily., Disp: , Rfl: ;  solifenacin (VESICARE) 5 MG tablet, Take 10 mg by mouth daily., Disp: , Rfl: ;  Ticagrelor (BRILINTA PO), Take 90 mg by mouth 2 (two) times daily., Disp: , Rfl:   BP   Ht 5' 1" (1.549 m)  Wt 203 lb (92.08 kg)  BMI 38.38 kg/m2  Body mass index is 38.38 kg/(m^2).          Review of Systems denies chest pain, dyspnea on exertion, PND, orthopnea. His have distal edema bilaterally. Takes Brilinta twice a day for cardiac stent     Objective:   Physical Exam BP   Ht 5' 1" (1.549 m)  Wt 203 lb (92.08 kg)  BMI 38.38 kg/m2 Blood pressure 177/65 heart rate 60 respirations 14 Gen.-alert and oriented x3 in no apparent distress HEENT normal for age Lungs no rhonchi or  wheezing Cardiovascular regular rhythm no murmurs carotid pulses 3+ palpable no bruits audible Abdomen soft nontender no palpable masses Musculoskeletal free of  major deformities Skin clear -no rashes Neurologic normal Lower extremities 3+ femoral pulses bilaterally. No distal pulses palpable. 1+ edema bilaterally. Bilateral upper extremities with 3+ brachial and radial pulses palpable. Left antecubital vein visible.  Today I ordered bilateral upper extremity vein mapping which I reviewed and interpreted. It does appear that the left cephalic and basilic veins are satisfactory for fistula creation. I independently performed a sono site and side ultrasound exam confirmed the above findings      Assessment:     End-stage renal disease needs vascular access-right-handed    Plan:     Plan left brachial to cephalic A-V fistula tomorrow Will hold Brilinta today and tomorrow and then resume on Thursday      

## 2013-01-09 NOTE — Interval H&P Note (Signed)
History and Physical Interval Note:  01/09/2013 10:40 AM  [name]  has presented today for surgery, with the diagnosis of End Stage Renal Disease  The various methods of treatment have been discussed with the patient and family. After consideration of risks, benefits and other options for treatment, the patient has consented to  Procedure(s) with comments: ARTERIOVENOUS (AV) FISTULA CREATION (Left) - Creation of Left Brachial Cephalic Arteriovenous Fistula as a surgical intervention .  The patient's history has been reviewed, patient examined, no change in status, stable for surgery.  I have reviewed the patient's chart and labs.  Questions were answered to the patient's satisfaction.     [name]

## 2013-01-09 NOTE — Telephone Encounter (Addendum)
Message copied by Fredrich Birks on Wed Jan 09, 2013  2:51 PM ------      Message from: Phillips Odor      Created: Wed Jan 09, 2013  1:50 PM                   ----- Message -----         From: Marlowe Shores, PA-C         Sent: 01/09/2013  12:04 PM           To: Melene Plan, RN, [facility]            6 week AVF F/U with duplex of AVF left arm - lawson,md ------  Mailed letter, dpm

## 2013-01-09 NOTE — Anesthesia Postprocedure Evaluation (Signed)
Anesthesia Post Note  Patient: [name]  Procedure(s) Performed: Procedure(s) (LRB): ARTERIOVENOUS (AV) FISTULA CREATION (Left)  Anesthesia type: MAC  Patient location: PACU  Post pain: Pain level controlled  Post assessment: Patient's Cardiovascular Status Stable  Last Vitals:  Filed Vitals:   01/09/13 1347  BP: 137/41  Pulse: 56  Temp: 36.4 C  Resp: 18    Post vital signs: Reviewed and stable  Level of consciousness: sedated  Complications: No apparent anesthesia complications

## 2013-01-09 NOTE — Anesthesia Preprocedure Evaluation (Addendum)
Anesthesia Evaluation    Airway Mallampati: II TM Distance: >3 FB Neck ROM: full    Dental  (+) Edentulous Upper and Dental Advidsory Given   Pulmonary neg pulmonary ROS,          Cardiovascular hypertension, Pt. on home beta blockers + Past MI and +CHF     Neuro/Psych negative neurological ROS  negative psych ROS   GI/Hepatic negative GI ROS, Neg liver ROS,   Endo/Other  diabetes, Well Controlled, Type 1, Insulin Dependent  Renal/GU ESRFRenal disease     Musculoskeletal negative musculoskeletal ROS (+)   Abdominal   Peds  Hematology negative hematology ROS (+)   Anesthesia Other Findings   Reproductive/Obstetrics                          Anesthesia Physical Anesthesia Plan  ASA: III  Anesthesia Plan: MAC   Post-op Pain Management:    Induction: Intravenous  Airway Management Planned: Mask  Additional Equipment:   Intra-op Plan:   Post-operative Plan:   Informed Consent:   Dental Advisory Given  Plan Discussed with: CRNA, Anesthesiologist and Surgeon  Anesthesia Plan Comments:        Anesthesia Quick Evaluation

## 2013-01-09 NOTE — Op Note (Signed)
OPERATIVE REPORT  Date of Surgery: 01/09/2013  Surgeon: [name], MD  Assistant: Clearence Ped Pre-op Diagnosis: End Stage Renal Disease  Post-op Diagnosis: End Stage Renal Disease  Procedure: Procedure(s): ARTERIOVENOUS (AV) FISTULA CREATION-left brachial-cephalic  Anesthesia: MAC  EBL: Minimal  Complications: None  Procedure Details: The patient was taken to the operating room placed in the supine position at which time the left upper extremity was prepped with Betadine scrub and solution draped in a routine sterile manner. After infiltration with 1% Xylocaine with epinephrine a transverse incision was made in the antecubital area. Antecubital vein dissected free the cephalic branch was about 2-1/2 mm in size. It was ligated distally transected and gently dilated with heparinized saline. A 3 Fogarty catheter would easily traversed into the shoulder area. It was somewhat deep and location but seemed to be adequate in size although borderline. Brachial artery was exposed beneath the fascia encircled with Vesseloops. No heparin was given. Brachial artery was occluded proximally and distally a 15 blade extended with Potts scissors. Was a 3 mm vessel but had excellent flow. The vein was carefully measured spatulated and anastomosed end to side with 6-0 Prolene. Vesseloops were then released and there was Pulse and thrill in the fistula and audible Doppler flow up to the shoulder. This was also imaged with the SonoSite ultrasound and no significant branches were identified and there was excellent flow in the fistula. Adequate hemostasis was achieved wound closed in layers with Vicryl in a subcuticular fashion with Dermabond patient taken to the recovery room in stable condition   [name], MD 01/09/2013 12:21 PM

## 2013-01-09 NOTE — Transfer of Care (Signed)
Immediate Anesthesia Transfer of Care Note  Patient: [name]  Procedure(s) Performed: Procedure(s) with comments: ARTERIOVENOUS (AV) FISTULA CREATION (Left) - Creation of Left Brachial Cephalic Arteriovenous Fistula  Patient Location: PACU  Anesthesia Type:MAC  Level of Consciousness: awake, alert  and oriented  Airway & Oxygen Therapy: Patient Spontanous Breathing and Patient connected to nasal cannula oxygen  Post-op Assessment: Report given to PACU RN, Post -op Vital signs reviewed and stable and Patient moving all extremities X 4  Post vital signs: Reviewed and stable  Complications: No apparent anesthesia complications

## 2013-01-11 ENCOUNTER — Encounter (HOSPITAL_COMMUNITY): Payer: Self-pay | Admitting: Vascular Surgery

## 2013-02-04 ENCOUNTER — Telehealth: Payer: Self-pay

## 2013-02-04 NOTE — Telephone Encounter (Signed)
Phone call from pt.  Reports she noticed a warmth and redness in incisional area last week, on Friday and Saturday.  Today, states that the redness is better; "it's only noticeable on the incision now, and not beside the incision."  Denies swelling, pain, separation of incision, or drainage.  Denies fever or chills.  Describes the redness along the incision as a "dark red".  States "when I press on it, it doesn't hurt."   Advised to continue to monitor for signs of infection; ie: increased inflammation, increased tenderness, fever/chills, or drainage.  Advised to keep area clean/ dry and to call office if symptoms worsen.  Has f/u appt. 02/26/13.  Pt. Verb. Understanding.

## 2013-02-12 ENCOUNTER — Telehealth: Payer: Self-pay

## 2013-02-12 ENCOUNTER — Ambulatory Visit (INDEPENDENT_AMBULATORY_CARE_PROVIDER_SITE_OTHER): Payer: Medicare Other | Admitting: Vascular Surgery

## 2013-02-12 ENCOUNTER — Encounter: Payer: Self-pay | Admitting: Vascular Surgery

## 2013-02-12 VITALS — BP 151/74 | HR 67 | Temp 97.6°F | Resp 20 | Ht 61.0 in | Wt 204.0 lb

## 2013-02-12 DIAGNOSIS — N186 End stage renal disease: Secondary | ICD-10-CM

## 2013-02-12 NOTE — Telephone Encounter (Signed)
Phone call from pt.   States she has a blister that is approx. 1/4 " in length that has raised on outside of her arm.  c/o a bloody, pus-like discharge that started on Sunday.  States area is tender.  Denies fever/ chills.  Voices concern for infection.  Advised will schedule her to come in for a nurse visit at 2:30 PM today, for incision check.  Agrees with plan.

## 2013-02-12 NOTE — Progress Notes (Signed)
Subjective:     Patient ID: [name], female   DOB: 15-Jan-1935, 78 y.o.   MRN: [id number]  HPI this 78 year old female had an AV fistula created by me on 01/09/2013. This was in the left upper extremity brachial to cephalic. She noticed some swelling and tenderness on the lateral aspect of her incision. She denies chills and fever.  Review of Systems     Objective:   Physical Exam BP 151/74  Pulse 67  Temp(Src) 97.6 F (36.4 C)  Resp 20  Ht 5\' 1"  (1.549 m)  Wt 204 lb (92.534 kg)  BMI 38.57 kg/m2  General well-developed well-nourished female no apparent stress alert and oriented x3 Left upper extremity with well-healed antecubital wound with the exception of the lateral 1 cm where there is swelling and slight fluctuance. There was a small opening and I compressed some purulent material from this wound. The fistula is widely patent and is not directly below this area. 2+ radial pulse palpable distally with no evidence of steal syndrome.     Assessment:     Localized superficial wound infection lateral aspect of brachial cephalic AV fistula incision left upper extremity    Plan:     Keflex 500 mg 3 times a day x10 days Apply moist heat 3 times daily We'll put sterile dressing over wounds today Return in 2 weeks as scheduled. We'll also get duplex scan of AV fistula at that time If patient develops chills and fever will be in touch with [place]

## 2013-02-25 ENCOUNTER — Encounter: Payer: Self-pay | Admitting: Vascular Surgery

## 2013-02-26 ENCOUNTER — Ambulatory Visit (INDEPENDENT_AMBULATORY_CARE_PROVIDER_SITE_OTHER): Payer: Medicare Other | Admitting: Vascular Surgery

## 2013-02-26 ENCOUNTER — Encounter (INDEPENDENT_AMBULATORY_CARE_PROVIDER_SITE_OTHER): Payer: Medicare Other | Admitting: *Deleted

## 2013-02-26 ENCOUNTER — Encounter: Payer: Self-pay | Admitting: Vascular Surgery

## 2013-02-26 VITALS — BP 145/67 | HR 59 | Resp 18 | Ht 61.0 in | Wt 200.0 lb

## 2013-02-26 DIAGNOSIS — Z48812 Encounter for surgical aftercare following surgery on the circulatory system: Secondary | ICD-10-CM

## 2013-02-26 DIAGNOSIS — Z4931 Encounter for adequacy testing for hemodialysis: Secondary | ICD-10-CM

## 2013-02-26 DIAGNOSIS — N186 End stage renal disease: Secondary | ICD-10-CM

## 2013-02-26 DIAGNOSIS — I770 Arteriovenous fistula, acquired: Secondary | ICD-10-CM

## 2013-02-26 DIAGNOSIS — N289 Disorder of kidney and ureter, unspecified: Secondary | ICD-10-CM | POA: Insufficient documentation

## 2013-02-26 NOTE — Progress Notes (Signed)
Subjective:     Patient ID: [name], female   DOB: 09-Jul-1935, 78 y.o.   MRN: [id number]  HPI this 78 year old female left brachiocephalic AV fistula created by me in May of 2014. She has chronic renal insufficiency but is not yet on hemodialysis. She denies pain or numbness in the left hand.  Review of Systems     Objective:   Physical Exam BP 145/67  Pulse 59  Resp 18  Ht 5\' 1"  (1.549 m)  Wt 200 lb (90.719 kg)  BMI 37.81 kg/m2  General well-developed well-nourished female in no apparent stress alert and oriented x3 Left upper extremity with well-healed antecubital wound. Excellent pulse and palpable thrill an upper arm fistula. 2+ radial pulse distally palpable . Left hand well perfused  Today I ordered a duplex scan of the left brachiocephalic AV fistula which reveals a fistula to be widely patent with excellent flow.      Assessment:     Nicely functioning left brachiocephalic AV fistula in patient with chronic renal insufficiency-not on hemodialysis at this time    Plan:     Okay to utilize left brachiocephalic AV fistula in mid August 20 14 if needed Return to see [place] on when necessary basis

## 2013-06-18 ENCOUNTER — Encounter: Payer: Self-pay | Admitting: Vascular Surgery

## 2013-06-19 ENCOUNTER — Ambulatory Visit: Payer: Medicare Other | Admitting: Vascular Surgery

## 2013-07-10 ENCOUNTER — Ambulatory Visit: Payer: Medicare Other | Admitting: Vascular Surgery

## 2013-07-16 ENCOUNTER — Encounter: Payer: Self-pay | Admitting: Vascular Surgery

## 2013-07-17 ENCOUNTER — Encounter (HOSPITAL_COMMUNITY): Payer: Self-pay | Admitting: Pharmacy Technician

## 2013-07-17 ENCOUNTER — Encounter: Payer: Self-pay | Admitting: Vascular Surgery

## 2013-07-17 ENCOUNTER — Other Ambulatory Visit: Payer: Self-pay | Admitting: *Deleted

## 2013-07-17 ENCOUNTER — Ambulatory Visit (INDEPENDENT_AMBULATORY_CARE_PROVIDER_SITE_OTHER): Payer: Medicare Other | Admitting: Vascular Surgery

## 2013-07-17 VITALS — BP 159/60 | HR 50 | Temp 97.6°F | Ht 61.0 in | Wt 204.6 lb

## 2013-07-17 DIAGNOSIS — N186 End stage renal disease: Secondary | ICD-10-CM

## 2013-07-17 DIAGNOSIS — T82898A Other specified complication of vascular prosthetic devices, implants and grafts, initial encounter: Secondary | ICD-10-CM | POA: Insufficient documentation

## 2013-07-17 NOTE — Progress Notes (Signed)
[facility] [place]  Patient name: [name] MRN: [id number] DOB: 11-Mar-1935 Sex: female  REASON FOR VISIT: follow up of left brachiocephalic AV fistula.  HPI: [name] is a 78 y.o. female who had a left brachiocephalic AV fistula by Dr. Hart Rochester on 01/09/2013. He saw her in follow up on 02/12/2013 with some swelling and tenderness along the lateral aspect of her incision. He put her on Keflex and she was to return in 2 weeks for her regularly scheduled appointment. On 02/26/2013 she returned the wound had improved. Duplex at that time showed several areas of significantly increased callosity within her fistula. He comes in today she's had continued occasional drainage from the lateral aspect of her incision. Is not yet on dialysis. She denies fever or chills.  Past Medical History  Diagnosis Date  . Diabetes mellitus without complication   . ESRD (end stage renal disease)     Renal insufficiency  . Hypertension   . Heart failure December 07, 2012    Right side  . Hypercalcemia     2nd to hyperparathyroidism  . Arthritis     Gout  . Myocardial infarction   . Thyroid disease     Hyperparathyroidism  . Thyroid disease     Hypothyroidism  . Dyslipidemia   . CHF (congestive heart failure)    Family History  Problem Relation Age of Onset  . Diabetes Sister   . Heart disease Sister     before age 45  . Hyperlipidemia Sister   . Hypertension Sister   . Diabetes Sister   . Hyperlipidemia Sister   . Hypertension Sister    SOCIAL HISTORY: History  Substance Use Topics  . Smoking status: Former Smoker    Types: Cigarettes    Quit date: 01/08/1998  . Smokeless tobacco: Never Used  . Alcohol Use: No   Allergies  Allergen Reactions  . Macrobid [Nitrofurantoin Macrocrystal] Anaphylaxis and Rash  . Influenza Vaccines Other (See Comments)    blisters   Current Outpatient Prescriptions  Medication Sig Dispense Refill  . acetaminophen  (TYLENOL) 500 MG tablet Take 1,000 mg by mouth 3 (three) times daily as needed for pain.      Marland Kitchen albuterol (PROVENTIL HFA;VENTOLIN HFA) 108 (90 BASE) MCG/ACT inhaler Inhale 2 puffs into the lungs every 6 (six) hours as needed for wheezing or shortness of breath.      Marland Kitchen albuterol (PROVENTIL) (2.5 MG/3ML) 0.083% nebulizer solution Take 2.5 mg by nebulization every 6 (six) hours as needed for wheezing or shortness of breath.       Marland Kitchen amLODipine (NORVASC) 5 MG tablet Take 5 mg by mouth daily.      Marland Kitchen aspirin EC 81 MG tablet Take 81 mg by mouth every evening.      . clotrimazole-betamethasone (LOTRISONE) cream Apply 1 application topically 2 (two) times daily as needed (for rash).       Marland Kitchen doxycycline (VIBRA-TABS) 100 MG tablet Take 1 tablet by mouth 2 (two) times daily.      . ergocalciferol (VITAMIN D2) 50000 UNITS capsule Take 50,000 Units by mouth 2 (two) times a week. On Sunday and Wednesday      . furosemide (LASIX) 20 MG tablet Take 40 mg by mouth See admin instructions. 1 tablet (20 mg) every morning and another tablet at noon as needed for excessive leg swelling      . gabapentin (NEURONTIN) 400 MG capsule Take 400 mg by mouth 3 (three)  times daily.      . insulin aspart (NOVOLOG) 100 UNIT/ML injection Inject 5-12 Units into the skin 2 (two) times daily. Sliding scale:  70-100 5 units, 100-150 10 units, 151-200 12 units, 201-250 13 units, 251-300 14 units, 301-350 15 units, 351-400 16 units, higher than 400 18 units      . insulin glargine (LANTUS) 100 UNIT/ML injection Inject 55 Units into the skin at bedtime.       Marland Kitchen levothyroxine (SYNTHROID, LEVOTHROID) 75 MCG tablet Take 88 mcg by mouth daily before breakfast.       . metoprolol (LOPRESSOR) 50 MG tablet Take 50 mg by mouth 2 (two) times daily.      . nitroGLYCERIN (NITROSTAT) 0.4 MG SL tablet Place 0.4 mg under the tongue every 5 (five) minutes as needed for chest pain.      . Omega-3 Fatty Acids (FISH OIL) 1200 MG CAPS Take 1,200 mg by mouth 2  (two) times daily.       Marland Kitchen oxyCODONE (ROXICODONE) 5 MG immediate release tablet Take 1 tablet (5 mg total) by mouth every 4 (four) hours as needed for pain.  30 tablet  0  . ranolazine (RANEXA) 500 MG 12 hr tablet Take 500 mg by mouth 2 (two) times daily.      . Ticagrelor (BRILINTA) 90 MG TABS tablet Take 1 tablet (90 mg total) by mouth 2 (two) times daily.  60 tablet     No current facility-administered medications for this visit.   REVIEW OF SYSTEMS: Arly.Keller ] denotes positive finding; [  ] denotes negative finding  CARDIOVASCULAR:  [ ]  chest pain   [ ]  chest pressure   [ ]  palpitations   [ ]  orthopnea  PULMONARY:   [ ]  productive cough   [ ]  asthma   [ ]  wheezing INTEGUMENTARY:  [ ]  rashes  [ ]  ulcers CONSTITUTIONAL:  [ ]  fever   [ ]  chills  PHYSICAL EXAM: Filed Vitals:   07/17/13 1051  BP: 159/60  Pulse: 50  Temp: 97.6 F (36.4 C)  TempSrc: Oral  Height: 5\' 1"  (1.549 m)  Weight: 204 lb 9.6 oz (92.806 kg)  SpO2: 98%   Body mass index is 38.68 kg/(m^2). GENERAL: The patient is a well-nourished female, in no acute distress. The vital signs are documented above. CARDIOVASCULAR: There is a regular rate and rhythm.  PULMONARY: There is good air exchange bilaterally without wheezing or rales. I am unable to palpate a thrill or bruit in her left upper arm AV fistula. There was some slight induration of the lateral aspect of her incision but no significant erythema.  DATA:  I reviewed her previous vein map prior to her brachiocephalic fistula. The forearm and upper arm cephalic vein on the right did not appear to be adequate for a fistula. The basilic veins on both sides appear somewhat marginal but could potentially be used for a fistula.  MEDICAL ISSUES: In the office today, I anesthetized the lateral aspect of her incision and last the small area where there was some dissolving suture material but no frank abscess. A dressing was applied.  With respect to her clotted AV fistula I  recommended that we explore her basilic vein on the left. The vein is marginal we would potentially perform a first stage basilic vein transposition. If the vein looks reasonable in size we would proceed with placement of a basilic vein transposition. As she is not yet on dialysis, and her kidney function has been  relatively stable, I would not recommend placing an AV graft at this time.  DICKSON,CHRISTOPHER S [facility] [place] Beeper: [phone]

## 2013-07-26 ENCOUNTER — Encounter (HOSPITAL_COMMUNITY)
Admission: RE | Admit: 2013-07-26 | Discharge: 2013-07-26 | Disposition: A | Discharge status: Home / Self Care | Payer: Medicare Other | Source: Ambulatory Visit | Attending: Vascular Surgery | Admitting: Vascular Surgery

## 2013-07-26 ENCOUNTER — Encounter (HOSPITAL_COMMUNITY): Payer: Self-pay

## 2013-07-26 DIAGNOSIS — Z01812 Encounter for preprocedural laboratory examination: Secondary | ICD-10-CM | POA: Insufficient documentation

## 2013-07-26 DIAGNOSIS — Z01818 Encounter for other preprocedural examination: Secondary | ICD-10-CM | POA: Insufficient documentation

## 2013-07-26 HISTORY — DX: Peripheral vascular disease, unspecified: I73.9

## 2013-07-26 HISTORY — DX: Zoster without complications: B02.9

## 2013-07-26 HISTORY — DX: Other amnesia: R41.3

## 2013-07-26 HISTORY — DX: Malignant (primary) neoplasm, unspecified: C80.1

## 2013-07-26 HISTORY — DX: Shortness of breath: R06.02

## 2013-07-26 HISTORY — DX: Cerebral infarction, unspecified: I63.9

## 2013-07-26 HISTORY — DX: Sleep apnea, unspecified: G47.30

## 2013-07-26 LAB — SURGICAL PCR SCREEN
MRSA, PCR: NEGATIVE
Staphylococcus aureus: NEGATIVE

## 2013-07-26 NOTE — Progress Notes (Signed)
Patient reported that she was recently treated for MRSA in Left Arm.

## 2013-07-26 NOTE — Pre-Procedure Instructions (Addendum)
[name]  07/26/2013   Your procedure is scheduled on:  Tuesday, December 2nd.  Report to [facility], Main Entrance / Entrance "A" at 7:30 AM.              Call this number if you have problems the morning of surgery: [phone]   Remember:   Do not eat food or drink liquids after midnight.   Take these medicines the morning of surgery with A SIP OF WATER: Amlodipine (Norvasc) , Levothyroxine (Synthyroidd), Metoprolol (Lopressor), Gabapentin (Neurotin, Ranolazine (Ranexa)).  May use Albuterol Inhaler and bring to the hospital with you the day of surgery.   Do not wear jewelry, make-up or nail polish.  Do not wear lotions, powders, or perfumes. You may wear deodorant.  Do not shave 48 hours prior to surgery.   Do not bring valuables to the hospital.  [facility] is not responsible  for any belongings or valuables.               Contacts, dentures or bridgework may not be worn into surgery.  Leave suitcase in the car. After surgery it may be brought to your room.  For patients admitted to the hospital, discharge time is determined by your treatment team.               Patients discharged the day of surgery will not be allowed to drive home.  Name and phone number of your driver: -   Special Instructions: Shower using CHG 2 nights before surgery and the night before surgery.  If you shower the day of surgery use CHG.  Use special wash - you have one bottle of CHG for all showers.  You should use approximately 1/3 of the bottle for each shower.   Please read over the following fact sheets that you were given: Pain Booklet, Coughing and Deep Breathing and Surgical Site Infection Prevention

## 2013-07-29 MED ORDER — DEXTROSE 5 % IV SOLN
1.5000 g | INTRAVENOUS | Status: AC
  Administered 2013-07-30: 1.5 g via INTRAVENOUS
  Filled 2013-07-29: qty 1.5

## 2013-07-29 NOTE — Progress Notes (Signed)
[facility] ([place]) called about requested info, states that they have not received a release.  Pt will need to sign release DOS.

## 2013-07-30 ENCOUNTER — Ambulatory Visit (HOSPITAL_COMMUNITY)
Admission: RE | Admit: 2013-07-30 | Discharge: 2013-07-30 | Disposition: A | Discharge status: Home / Self Care | Payer: Medicare Other | Source: Ambulatory Visit | Attending: Vascular Surgery | Admitting: Vascular Surgery

## 2013-07-30 ENCOUNTER — Other Ambulatory Visit: Payer: Self-pay

## 2013-07-30 ENCOUNTER — Encounter (HOSPITAL_COMMUNITY)
Admission: RE | Disposition: A | Discharge status: Home / Self Care | Payer: Self-pay | Source: Ambulatory Visit | Attending: Vascular Surgery

## 2013-07-30 ENCOUNTER — Ambulatory Visit (HOSPITAL_COMMUNITY): Payer: Medicare Other | Admitting: Certified Registered"

## 2013-07-30 ENCOUNTER — Telehealth: Payer: Self-pay | Admitting: Vascular Surgery

## 2013-07-30 ENCOUNTER — Encounter (HOSPITAL_COMMUNITY): Payer: Self-pay | Admitting: *Deleted

## 2013-07-30 ENCOUNTER — Encounter (HOSPITAL_COMMUNITY): Payer: Medicare Other | Admitting: Certified Registered"

## 2013-07-30 DIAGNOSIS — N184 Chronic kidney disease, stage 4 (severe): Secondary | ICD-10-CM

## 2013-07-30 DIAGNOSIS — I509 Heart failure, unspecified: Secondary | ICD-10-CM | POA: Insufficient documentation

## 2013-07-30 DIAGNOSIS — Z87891 Personal history of nicotine dependence: Secondary | ICD-10-CM | POA: Insufficient documentation

## 2013-07-30 DIAGNOSIS — N186 End stage renal disease: Secondary | ICD-10-CM

## 2013-07-30 DIAGNOSIS — Z7982 Long term (current) use of aspirin: Secondary | ICD-10-CM | POA: Insufficient documentation

## 2013-07-30 DIAGNOSIS — Z48812 Encounter for surgical aftercare following surgery on the circulatory system: Secondary | ICD-10-CM

## 2013-07-30 DIAGNOSIS — I129 Hypertensive chronic kidney disease with stage 1 through stage 4 chronic kidney disease, or unspecified chronic kidney disease: Secondary | ICD-10-CM | POA: Insufficient documentation

## 2013-07-30 DIAGNOSIS — E119 Type 2 diabetes mellitus without complications: Secondary | ICD-10-CM | POA: Insufficient documentation

## 2013-07-30 DIAGNOSIS — Z794 Long term (current) use of insulin: Secondary | ICD-10-CM | POA: Insufficient documentation

## 2013-07-30 HISTORY — PX: BASCILIC VEIN TRANSPOSITION: SHX5742

## 2013-07-30 LAB — GLUCOSE, CAPILLARY: Glucose-Capillary: 168 mg/dL — ABNORMAL HIGH (ref 70–99)

## 2013-07-30 LAB — POCT I-STAT 4, (NA,K, GLUC, HGB,HCT)
Glucose, Bld: 230 mg/dL — ABNORMAL HIGH (ref 70–99)
HCT: 35 % — ABNORMAL LOW (ref 36.0–46.0)
Potassium: 4.9 mEq/L (ref 3.5–5.1)
Sodium: 140 mEq/L (ref 135–145)

## 2013-07-30 SURGERY — TRANSPOSITION, VEIN, BASILIC
Anesthesia: Monitor Anesthesia Care | Site: Arm Upper | Laterality: Left

## 2013-07-30 MED ORDER — OXYCODONE-ACETAMINOPHEN 5-325 MG PO TABS
1.0000 | ORAL_TABLET | Freq: Once | ORAL | Status: AC
  Administered 2013-07-30: 1 via ORAL

## 2013-07-30 MED ORDER — ONDANSETRON HCL 4 MG/2ML IJ SOLN
INTRAMUSCULAR | Status: DC | PRN
  Administered 2013-07-30: 4 mg via INTRAVENOUS

## 2013-07-30 MED ORDER — THROMBIN 20000 UNITS EX SOLR
CUTANEOUS | Status: AC
  Filled 2013-07-30: qty 20000

## 2013-07-30 MED ORDER — 0.9 % SODIUM CHLORIDE (POUR BTL) OPTIME
TOPICAL | Status: DC | PRN
  Administered 2013-07-30: 1000 mL

## 2013-07-30 MED ORDER — SODIUM CHLORIDE 0.9 % IV SOLN
INTRAVENOUS | Status: DC
  Administered 2013-07-30: 11:00:00 via INTRAVENOUS
  Administered 2013-07-30: 10 mL/h via INTRAVENOUS

## 2013-07-30 MED ORDER — PROPOFOL 10 MG/ML IV BOLUS
INTRAVENOUS | Status: DC | PRN
  Administered 2013-07-30: 150 mg via INTRAVENOUS

## 2013-07-30 MED ORDER — INSULIN ASPART 100 UNIT/ML ~~LOC~~ SOLN
7.0000 [IU] | Freq: Once | SUBCUTANEOUS | Status: AC
  Administered 2013-07-30: 7 [IU] via SUBCUTANEOUS

## 2013-07-30 MED ORDER — HEPARIN SODIUM (PORCINE) 1000 UNIT/ML IJ SOLN
INTRAMUSCULAR | Status: DC | PRN
  Administered 2013-07-30: 7000 [IU] via INTRAVENOUS

## 2013-07-30 MED ORDER — OXYCODONE-ACETAMINOPHEN 5-325 MG PO TABS: 1.0000 | ORAL_TABLET | ORAL | Status: DC | PRN

## 2013-07-30 MED ORDER — FENTANYL CITRATE 0.05 MG/ML IJ SOLN
INTRAMUSCULAR | Status: AC
  Filled 2013-07-30: qty 2

## 2013-07-30 MED ORDER — FENTANYL CITRATE 0.05 MG/ML IJ SOLN
25.0000 ug | INTRAMUSCULAR | Status: DC | PRN
Start: 2013-07-30 — End: 2013-07-30
  Administered 2013-07-30: 25 ug via INTRAVENOUS

## 2013-07-30 MED ORDER — LIDOCAINE HCL (CARDIAC) 20 MG/ML IV SOLN
INTRAVENOUS | Status: DC | PRN
  Administered 2013-07-30: 100 mg via INTRAVENOUS

## 2013-07-30 MED ORDER — PROTAMINE SULFATE 10 MG/ML IV SOLN
INTRAVENOUS | Status: DC | PRN
  Administered 2013-07-30: 10 mg via INTRAVENOUS
  Administered 2013-07-30: 20 mg via INTRAVENOUS

## 2013-07-30 MED ORDER — SODIUM CHLORIDE 0.9 % IR SOLN
Status: DC | PRN
  Administered 2013-07-30: 09:00:00

## 2013-07-30 MED ORDER — OXYCODONE-ACETAMINOPHEN 5-325 MG PO TABS
ORAL_TABLET | ORAL | Status: AC
  Filled 2013-07-30: qty 1

## 2013-07-30 MED ORDER — FENTANYL CITRATE 0.05 MG/ML IJ SOLN
INTRAMUSCULAR | Status: DC | PRN
  Administered 2013-07-30: 50 ug via INTRAVENOUS

## 2013-07-30 SURGICAL SUPPLY — 38 items
ADH SKN CLS APL DERMABOND .7 (GAUZE/BANDAGES/DRESSINGS) ×1
CANISTER SUCTION 2500CC (MISCELLANEOUS) ×2 IMPLANT
CLIP TI MEDIUM 24 (CLIP) ×2 IMPLANT
CLIP TI WIDE RED SMALL 24 (CLIP) ×2 IMPLANT
COVER PROBE W GEL 5X96 (DRAPES) ×2 IMPLANT
COVER SURGICAL LIGHT HANDLE (MISCELLANEOUS) ×2 IMPLANT
DECANTER SPIKE VIAL GLASS SM (MISCELLANEOUS) ×2 IMPLANT
DERMABOND ADVANCED (GAUZE/BANDAGES/DRESSINGS) ×1
DERMABOND ADVANCED .7 DNX12 (GAUZE/BANDAGES/DRESSINGS) ×1 IMPLANT
DRAIN PENROSE 1/2X12 LTX STRL (WOUND CARE) IMPLANT
ELECT REM PT RETURN 9FT ADLT (ELECTROSURGICAL) ×2
ELECTRODE REM PT RTRN 9FT ADLT (ELECTROSURGICAL) ×1 IMPLANT
GLOVE BIO SURGEON STRL SZ7.5 (GLOVE) ×2 IMPLANT
GLOVE BIOGEL PI IND STRL 6.5 (GLOVE) IMPLANT
GLOVE BIOGEL PI IND STRL 7.0 (GLOVE) IMPLANT
GLOVE BIOGEL PI IND STRL 8 (GLOVE) ×1 IMPLANT
GLOVE BIOGEL PI INDICATOR 6.5 (GLOVE) ×1
GLOVE BIOGEL PI INDICATOR 7.0 (GLOVE) ×1
GLOVE BIOGEL PI INDICATOR 8 (GLOVE) ×2
GLOVE ECLIPSE 6.5 STRL STRAW (GLOVE) ×2 IMPLANT
GLOVE SS BIOGEL STRL SZ 6.5 (GLOVE) IMPLANT
GLOVE SUPERSENSE BIOGEL SZ 6.5 (GLOVE) ×1
GOWN STRL NON-REIN LRG LVL3 (GOWN DISPOSABLE) ×6 IMPLANT
KIT BASIN OR (CUSTOM PROCEDURE TRAY) ×2 IMPLANT
KIT ROOM TURNOVER OR (KITS) ×2 IMPLANT
NS IRRIG 1000ML POUR BTL (IV SOLUTION) ×2 IMPLANT
PACK CV ACCESS (CUSTOM PROCEDURE TRAY) ×2 IMPLANT
PAD ARMBOARD 7.5X6 YLW CONV (MISCELLANEOUS) ×4 IMPLANT
SPONGE SURGIFOAM ABS GEL 100 (HEMOSTASIS) IMPLANT
SUT PROLENE 6 0 BV (SUTURE) ×2 IMPLANT
SUT SILK 2 0 SH (SUTURE) ×2 IMPLANT
SUT VIC AB 3-0 SH 27 (SUTURE) ×2
SUT VIC AB 3-0 SH 27X BRD (SUTURE) ×1 IMPLANT
SUT VICRYL 4-0 PS2 18IN ABS (SUTURE) ×2 IMPLANT
TOWEL OR 17X24 6PK STRL BLUE (TOWEL DISPOSABLE) ×2 IMPLANT
TOWEL OR 17X26 10 PK STRL BLUE (TOWEL DISPOSABLE) ×2 IMPLANT
UNDERPAD 30X30 INCONTINENT (UNDERPADS AND DIAPERS) ×2 IMPLANT
WATER STERILE IRR 1000ML POUR (IV SOLUTION) ×2 IMPLANT

## 2013-07-30 NOTE — H&P (View-Only) (Signed)
  [facility] and [facility]in Specialist of [place]  Patient name: [name] MRN: [id number] DOB: 01/12/1935 Sex: female  REASON FOR VISIT: follow up of left brachiocephalic AV fistula.  HPI: [name] is a 78 y.o. female who had a left brachiocephalic AV fistula by Dr. Lawson on 01/09/2013. He saw her in follow up on 02/12/2013 with some swelling and tenderness along the lateral aspect of her incision. He put her on Keflex and she was to return in 2 weeks for her regularly scheduled appointment. On 02/26/2013 she returned the wound had improved. Duplex at that time showed several areas of significantly increased callosity within her fistula. He comes in today she's had continued occasional drainage from the lateral aspect of her incision. Is not yet on dialysis. She denies fever or chills.  Past Medical History  Diagnosis Date  . Diabetes mellitus without complication   . ESRD (end stage renal disease)     Renal insufficiency  . Hypertension   . Heart failure December 07, 2012    Right side  . Hypercalcemia     2nd to hyperparathyroidism  . Arthritis     Gout  . Myocardial infarction   . Thyroid disease     Hyperparathyroidism  . Thyroid disease     Hypothyroidism  . Dyslipidemia   . CHF (congestive heart failure)    Family History  Problem Relation Age of Onset  . Diabetes Sister   . Heart disease Sister     before age 60  . Hyperlipidemia Sister   . Hypertension Sister   . Diabetes Sister   . Hyperlipidemia Sister   . Hypertension Sister    SOCIAL HISTORY: History  Substance Use Topics  . Smoking status: Former Smoker    Types: Cigarettes    Quit date: 01/08/1998  . Smokeless tobacco: Never Used  . Alcohol Use: No   Allergies  Allergen Reactions  . Macrobid [Nitrofurantoin Macrocrystal] Anaphylaxis and Rash  . Influenza Vaccines Other (See Comments)    blisters   Current Outpatient Prescriptions  Medication Sig Dispense Refill  . acetaminophen  (TYLENOL) 500 MG tablet Take 1,000 mg by mouth 3 (three) times daily as needed for pain.      . albuterol (PROVENTIL HFA;VENTOLIN HFA) 108 (90 BASE) MCG/ACT inhaler Inhale 2 puffs into the lungs every 6 (six) hours as needed for wheezing or shortness of breath.      . albuterol (PROVENTIL) (2.5 MG/3ML) 0.083% nebulizer solution Take 2.5 mg by nebulization every 6 (six) hours as needed for wheezing or shortness of breath.       . amLODipine (NORVASC) 5 MG tablet Take 5 mg by mouth daily.      . aspirin EC 81 MG tablet Take 81 mg by mouth every evening.      . clotrimazole-betamethasone (LOTRISONE) cream Apply 1 application topically 2 (two) times daily as needed (for rash).       . doxycycline (VIBRA-TABS) 100 MG tablet Take 1 tablet by mouth 2 (two) times daily.      . ergocalciferol (VITAMIN D2) 50000 UNITS capsule Take 50,000 Units by mouth 2 (two) times a week. On Sunday and Wednesday      . furosemide (LASIX) 20 MG tablet Take 40 mg by mouth See admin instructions. 1 tablet (20 mg) every morning and another tablet at noon as needed for excessive leg swelling      . gabapentin (NEURONTIN) 400 MG capsule Take 400 mg by mouth 3 (three)   times daily.      . insulin aspart (NOVOLOG) 100 UNIT/ML injection Inject 5-12 Units into the skin 2 (two) times daily. Sliding scale:  70-100 5 units, 100-150 10 units, 151-200 12 units, 201-250 13 units, 251-300 14 units, 301-350 15 units, 351-400 16 units, higher than 400 18 units      . insulin glargine (LANTUS) 100 UNIT/ML injection Inject 55 Units into the skin at bedtime.       . levothyroxine (SYNTHROID, LEVOTHROID) 75 MCG tablet Take 88 mcg by mouth daily before breakfast.       . metoprolol (LOPRESSOR) 50 MG tablet Take 50 mg by mouth 2 (two) times daily.      . nitroGLYCERIN (NITROSTAT) 0.4 MG SL tablet Place 0.4 mg under the tongue every 5 (five) minutes as needed for chest pain.      . Omega-3 Fatty Acids (FISH OIL) 1200 MG CAPS Take 1,200 mg by mouth 2  (two) times daily.       . oxyCODONE (ROXICODONE) 5 MG immediate release tablet Take 1 tablet (5 mg total) by mouth every 4 (four) hours as needed for pain.  30 tablet  0  . ranolazine (RANEXA) 500 MG 12 hr tablet Take 500 mg by mouth 2 (two) times daily.      . Ticagrelor (BRILINTA) 90 MG TABS tablet Take 1 tablet (90 mg total) by mouth 2 (two) times daily.  60 tablet     No current facility-administered medications for this visit.   REVIEW OF SYSTEMS: [X ] denotes positive finding; [  ] denotes negative finding  CARDIOVASCULAR:  [ ] chest pain   [ ] chest pressure   [ ] palpitations   [ ] orthopnea  PULMONARY:   [ ] productive cough   [ ] asthma   [ ] wheezing INTEGUMENTARY:  [ ] rashes  [ ] ulcers CONSTITUTIONAL:  [ ] fever   [ ] chills  PHYSICAL EXAM: Filed Vitals:   07/17/13 1051  BP: 159/60  Pulse: 50  Temp: 97.6 F (36.4 C)  TempSrc: Oral  Height: 5' 1" (1.549 m)  Weight: 204 lb 9.6 oz (92.806 kg)  SpO2: 98%   Body mass index is 38.68 kg/(m^2). GENERAL: The patient is a well-nourished female, in no acute distress. The vital signs are documented above. CARDIOVASCULAR: There is a regular rate and rhythm.  PULMONARY: There is good air exchange bilaterally without wheezing or rales. I am unable to palpate a thrill or bruit in her left upper arm AV fistula. There was some slight induration of the lateral aspect of her incision but no significant erythema.  DATA:  I reviewed her previous vein map prior to her brachiocephalic fistula. The forearm and upper arm cephalic vein on the right did not appear to be adequate for a fistula. The basilic veins on both sides appear somewhat marginal but could potentially be used for a fistula.  MEDICAL ISSUES: In the office today, I anesthetized the lateral aspect of her incision and last the small area where there was some dissolving suture material but no frank abscess. A dressing was applied.  With respect to her clotted AV fistula I  recommended that we explore her basilic vein on the left. The vein is marginal we would potentially perform a first stage basilic vein transposition. If the vein looks reasonable in size we would proceed with placement of a basilic vein transposition. As she is not yet on dialysis, and her kidney function has been   relatively stable, I would not recommend placing an AV graft at this time.  DICKSON,CHRISTOPHER S [facility] [place] Beeper: [phone]    

## 2013-07-30 NOTE — Anesthesia Procedure Notes (Signed)
Procedure Name: LMA Insertion Date/Time: 07/30/2013 9:21 AM Performed by: Jerilee Hoh Pre-anesthesia Checklist: Patient identified, Emergency Drugs available, Suction available and Patient being monitored Patient Re-evaluated:Patient Re-evaluated prior to inductionOxygen Delivery Method: Circle system utilized Preoxygenation: Pre-oxygenation with 100% oxygen Intubation Type: IV induction Ventilation: Mask ventilation without difficulty LMA: LMA inserted LMA Size: 4.0 Tube type: Oral Number of attempts: 1 Placement Confirmation: positive ETCO2 and breath sounds checked- equal and bilateral Tube secured with: Tape Dental Injury: Teeth and Oropharynx as per pre-operative assessment

## 2013-07-30 NOTE — Preoperative (Signed)
Beta Blockers   Reason not to administer Beta Blockers:Not Applicable 

## 2013-07-30 NOTE — Op Note (Signed)
    NAME: KENDEL PESNELL   MRN: [id number] DOB: [birth year]    DATE OF OPERATION: 07/30/2013  PREOP DIAGNOSIS: Stage IV chronic kidney disease  POSTOP DIAGNOSIS: Same  PROCEDURE: First stage basilic vein transposition left arm  SURGEON: Di Kindle. Edilia Bo, MD, FACS  ASSIST: Della Goo PA  ANESTHESIA: Gen.   EBL: minimal  INDICATIONS: [name] is a 78 y.o. female who had a failed left upper arm brachiocephalic fistula. The cephalic vein in the right arm did not appear to be adequate for fistula. The basilic vein in both arms appeared marginal. She is brought in for potentially a left basilic vein transposition with the thought that if the vein is marginal we would only do a first stage procedure.  FINDINGS: the vein was approximately 3 mm. The arterial anastomosis was sewn approximately a centimeter from the old anastomosis. I did ligate the old fistula which was occluded.  TECHNIQUE:  The patient was taken to the operating room and received a general anesthetic. The left upper extremity was prepped and draped in usual sterile fashion. I had mapped the basilic vein and identified the location. Through this longitudinal incision, the basilic vein was dissected free and was fairly small. It was approximately 3 mm in diameter. I ligated it distally and then irrigated out with saline. I opened it longitudinally and connected this with a small branch to allow for slightly more spatulated anastomosis. Given the small size of the vein, I elected to do only a first stage basilic vein transposition.  Through the same incision, I dissected out the brachial artery proximal to the anastomosis of the brachial cephalic fistula. The patient was heparinized. The brachial artery was clamped proximally and distally and a longitudinal arteriotomy was made. The vein was mobilized over and sewn end to side to the artery using 6-0 Prolene suture. At the completion there was a good thrill in  this fistula. There was a radial and ulnar signal with the Doppler. The heparin was partially reversed with protamine. The wound was closed with deep layer 3-0 Vicryl and the skin closed with 4-0 Vicryl. Dermabond was applied. The patient tolerated the procedure well and was transferred to the recovery room in stable condition. All needle and sponge counts were correct.  Waverly Ferrari, MD, FACS [facility]  DATE OF DICTATION:   07/30/2013

## 2013-07-30 NOTE — Transfer of Care (Signed)
Immediate Anesthesia Transfer of Care Note  Patient: [name]  Procedure(s) Performed: Procedure(s): 1ST Stage BASCILIC VEIN TRANSPOSITION (Left)  Patient Location: PACU  Anesthesia Type:General  Level of Consciousness: awake, alert , oriented and patient cooperative  Airway & Oxygen Therapy: Patient Spontanous Breathing and Patient connected to nasal cannula oxygen  Post-op Assessment: Report given to PACU RN, Post -op Vital signs reviewed and stable and Patient moving all extremities  Post vital signs: Reviewed and stable  Complications: No apparent anesthesia complications

## 2013-07-30 NOTE — Anesthesia Preprocedure Evaluation (Addendum)
Anesthesia Evaluation  Patient identified by MRN, date of birth, ID band Patient awake    Reviewed: Allergy & Precautions, H&P , NPO status , Patient's Chart, lab work & pertinent test results  Airway Mallampati: II TM Distance: >3 FB Neck ROM: full    Dental  (+) Edentulous Upper and Dental Advidsory Given   Pulmonary neg pulmonary ROS, sleep apnea , former smoker,  breath sounds clear to auscultation        Cardiovascular hypertension, Pt. on home beta blockers + Past MI and +CHF Rhythm:Regular Rate:Normal     Neuro/Psych negative neurological ROS  negative psych ROS   GI/Hepatic negative GI ROS, Neg liver ROS,   Endo/Other  diabetes, Well Controlled, Type 1, Insulin Dependent  Renal/GU ESRF and DialysisRenal disease     Musculoskeletal negative musculoskeletal ROS (+)   Abdominal (+) + obese,   Peds  Hematology negative hematology ROS (+)   Anesthesia Other Findings   Reproductive/Obstetrics                          Anesthesia Physical Anesthesia Plan  ASA: III  Anesthesia Plan: MAC   Post-op Pain Management:    Induction: Intravenous  Airway Management Planned: Mask and LMA  Additional Equipment:   Intra-op Plan:   Post-operative Plan:   Informed Consent: I have reviewed the patients History and Physical, chart, labs and discussed the procedure including the risks, benefits and alternatives for the proposed anesthesia with the patient or authorized representative who has indicated his/her understanding and acceptance.     Plan Discussed with: CRNA and Surgeon  Anesthesia Plan Comments:        Anesthesia Quick Evaluation

## 2013-07-30 NOTE — Interval H&P Note (Signed)
History and Physical Interval Note:  07/30/2013 8:56 AM  [name]  has presented today for surgery, with the diagnosis of ESRD  The various methods of treatment have been discussed with the patient and family. After consideration of risks, benefits and other options for treatment, the patient has consented to  Procedure(s): BASCILIC VEIN TRANSPOSITION (Left) as a surgical intervention .  The patient's history has been reviewed, patient examined, no change in status, stable for surgery.  I have reviewed the patient's chart and labs.  Questions were answered to the patient's satisfaction.     Kaitlin Ardito S

## 2013-07-30 NOTE — Anesthesia Postprocedure Evaluation (Signed)
  Anesthesia Post-op Note  Patient: [name]  Procedure(s) Performed: Procedure(s): 1ST Stage BASCILIC VEIN TRANSPOSITION (Left)  Patient Location: PACU  Anesthesia Type:General  Level of Consciousness: awake  Airway and Oxygen Therapy: Patient Spontanous Breathing  Post-op Pain: mild  Post-op Assessment: Post-op Vital signs reviewed  Post-op Vital Signs: stable  Complications: No apparent anesthesia complications

## 2013-07-30 NOTE — Telephone Encounter (Addendum)
Message copied by Fredrich Birks on Tue Jul 30, 2013  2:23 PM ------      Message from: Phillips Odor      Created: Tue Jul 30, 2013  1:21 PM      Regarding: FW: charge and F/U       LAB ORDER PLACED/ CP      ----- Message -----         From: Chuck Hint, MD         Sent: 07/30/2013  10:50 AM           To: Reuel Derby, Melene Plan, RN, #      Subject: charge and F/U                                           PROCEDURE: First stage basilic vein transposition left arm            SURGEON: Di Kindle. Edilia Bo, MD, FACS            ASSIST: Della Goo PA            She will need a follow up visit in 6 weeks to check on the first stage basilic vein transposition. I am interested only in the size of the fistula not the depth. Thank you. CD       ------  07/30/13: spoke with pt to schedule, dpm

## 2013-07-31 ENCOUNTER — Encounter (HOSPITAL_COMMUNITY): Payer: Self-pay | Admitting: Vascular Surgery

## 2013-08-28 ENCOUNTER — Other Ambulatory Visit: Payer: Self-pay | Admitting: Vascular Surgery

## 2013-08-28 DIAGNOSIS — Z4931 Encounter for adequacy testing for hemodialysis: Secondary | ICD-10-CM

## 2013-08-28 DIAGNOSIS — N186 End stage renal disease: Secondary | ICD-10-CM

## 2013-09-10 ENCOUNTER — Encounter: Payer: Self-pay | Admitting: Vascular Surgery

## 2013-09-11 ENCOUNTER — Encounter: Payer: Medicare Other | Admitting: Vascular Surgery

## 2013-09-11 ENCOUNTER — Other Ambulatory Visit (HOSPITAL_COMMUNITY): Payer: Medicare Other

## 2013-10-08 ENCOUNTER — Encounter: Payer: Self-pay | Admitting: Vascular Surgery

## 2013-10-09 ENCOUNTER — Ambulatory Visit (INDEPENDENT_AMBULATORY_CARE_PROVIDER_SITE_OTHER): Payer: Self-pay | Admitting: Vascular Surgery

## 2013-10-09 ENCOUNTER — Encounter: Payer: Self-pay | Admitting: Vascular Surgery

## 2013-10-09 ENCOUNTER — Ambulatory Visit (HOSPITAL_COMMUNITY)
Admission: RE | Admit: 2013-10-09 | Discharge: 2013-10-09 | Disposition: A | Discharge status: Home / Self Care | Payer: Medicare Other | Source: Ambulatory Visit | Attending: Vascular Surgery | Admitting: Vascular Surgery

## 2013-10-09 VITALS — BP 181/63 | HR 59 | Ht 60.0 in | Wt 207.0 lb

## 2013-10-09 DIAGNOSIS — N186 End stage renal disease: Secondary | ICD-10-CM | POA: Insufficient documentation

## 2013-10-09 DIAGNOSIS — Z4931 Encounter for adequacy testing for hemodialysis: Secondary | ICD-10-CM

## 2013-10-09 NOTE — Assessment & Plan Note (Signed)
I think that there is a reasonable chance that her basilic vein transposition will be usable. For this reason I have recommended that we proceed with a second stage. She does have some narrowing in the proximal fistula which I will explore the time of her transposition. She is under length of which we will stop 5 days preoperatively. Her surgery is scheduled for 10/22/2013.

## 2013-10-09 NOTE — Progress Notes (Signed)
   Patient name: [name] MRN: [id number] DOB: 12-May-1935 Sex: female  REASON FOR VISIT: Follow up after first stage basilic vein transposition  HPI: [name] is a 79 y.o. female who had a left brachiocephalic AV fistula placed by Dr. Kellie Simmering on 01/09/2013. I saw her on 07/17/2013 at that time her AV fistula was clotted. Her basilic vein looks very small but I recommended exploration and possible first stage basilic vein transposition. On 07/30/13, she underwent a first stage basilic vein transposition in the left arm. She comes in for a 6 week follow up visit. Of note in reviewing her operative note the vein was only about 3 mm. She is not yet on dialysis. She denies any recent uremic symptoms.  REVIEW OF SYSTEMS: Valu.Nieves ] denotes positive finding; [  ] denotes negative finding  CARDIOVASCULAR:  [ ]  chest pain   [ ]  dyspnea on exertion    CONSTITUTIONAL:  [ ]  fever   [ ]  chills  PHYSICAL EXAM: Filed Vitals:   10/09/13 1507  BP: 181/63  Pulse: 59  Height: 5' (1.524 m)  Weight: 207 lb (93.895 kg)  SpO2: 90%   Body mass index is 40.43 kg/(m^2). GENERAL: The patient is a well-nourished female, in no acute distress. The vital signs are documented above. CARDIOVASCULAR: There is a regular rate and rhythm. PULMONARY: There is good air exchange bilaterally without wheezing or rales. She has a palpable thrill in her AV fistula in the left upper arm. She has a palpable left radial pulse.  DUPLEX OF AV FISTULA: The fistula is patent with an area of increased velocities in the proximal fistula. Diameters of the vein range from 0.46 cm-0.58 cm.  MEDICAL ISSUES:  End stage renal disease I think that there is a reasonable chance that her basilic vein transposition will be usable. For this reason I have recommended that we proceed with a second stage. She does have some narrowing in the proximal fistula which I will explore the time of her transposition. She is under length of which we  will stop 5 days preoperatively. Her surgery is scheduled for 10/22/2013.   [place] [facility] [place] Beeper: [phone]

## 2013-10-14 ENCOUNTER — Other Ambulatory Visit: Payer: Self-pay

## 2013-10-30 ENCOUNTER — Telehealth: Payer: Self-pay

## 2013-10-30 NOTE — Telephone Encounter (Signed)
Pt's son called and reported pt. Has had increased shortness of breath, elevated blood pressure, and increased swelling.  Stated she is being treated for Congestive Heart Failure.  Reported she is being treated at [facility] daily as an outpatient with IV Lasix.  Reported pt. has "had 4 # of fluid pulled off."  Reported her BP is 223/85 with walking, and 212/86 @ rest today.   Stated her breathing is still labored.  Reported that Dr. Justin Mend and Dr. Lennox Pippins are following pt.  Given pt. Status update to Dr. Scot Dock.  Recommends that we cancel the surgery for 2nd stage BVT on 3/6, until pt. is more stable.  Attempted to call pt. Left voice message to call office re: need to postpone her surgery.

## 2013-10-31 NOTE — Telephone Encounter (Signed)
Contacted pt. Per phone at approx. 11:45 PM.  Reports that she has an appt. with Dr. Lennox Pippins today at 4:30 PM.  Informed pt. That with her current symptoms with SOB and fluid retention related to her heart, Dr. Scot Dock recommends to postpone her surgery for left arm 2nd stage basilic vein transposition.  Pt. verbalized understanding.  Stated she will call back, to reschedule, at a later date, when her symptoms have stabilized.

## 2013-11-01 ENCOUNTER — Encounter (HOSPITAL_COMMUNITY): Admission: RE | Payer: Self-pay | Source: Ambulatory Visit

## 2013-11-01 ENCOUNTER — Ambulatory Visit (HOSPITAL_COMMUNITY): Admission: RE | Admit: 2013-11-01 | Payer: Medicare Other | Source: Ambulatory Visit | Admitting: Vascular Surgery

## 2013-11-01 SURGERY — TRANSPOSITION, VEIN, BASILIC
Anesthesia: General | Site: Arm Upper | Laterality: Left

## 2013-11-21 ENCOUNTER — Other Ambulatory Visit: Payer: Self-pay | Admitting: *Deleted

## 2013-11-27 ENCOUNTER — Other Ambulatory Visit (HOSPITAL_COMMUNITY): Payer: Medicare Other

## 2013-12-02 ENCOUNTER — Encounter (HOSPITAL_COMMUNITY): Payer: Self-pay | Admitting: Pharmacy Technician

## 2013-12-09 NOTE — Pre-Procedure Instructions (Signed)
KAILA DEVRIES  12/09/2013   Your procedure is scheduled on:  Tuesday  12/17/13  Report to [facility]  2 * 3 at 730 AM.  Call this number if you have problems the morning of surgery: [phone]   Remember:   Do not eat food or drink liquids after midnight.   Take these medicines the morning of surgery with A SIP OF WATER:  TYLENOL ID NEEDED, ALBUTEROL, AMLODIPINE(NORVASC), NEURONTIN,  IMDUR,LEVOTHYROXINE, TOPROL XL(METOPROLOL)   Do not wear jewelry, make-up or nail polish.  Do not wear lotions, powders, or perfumes. You may wear deodorant.  Do not shave 48 hours prior to surgery. Men may shave face and neck.  Do not bring valuables to the hospital.  [facility] is not responsible                  for any belongings or valuables.               Contacts, dentures or bridgework may not be worn into surgery.  Leave suitcase in the car. After surgery it may be brought to your room.  For patients admitted to the hospital, discharge time is determined by your                treatment team.               Patients discharged the day of surgery will not be allowed to drive  home.  Name and phone number of your driver:   Special Instructions:  Special Instructions: [place] - Preparing for Surgery  Before surgery, you can play an important role.  Because skin is not sterile, your skin needs to be as free of germs as possible.  You can reduce the number of germs on you skin by washing with CHG (chlorahexidine gluconate) soap before surgery.  CHG is an antiseptic cleaner which kills germs and bonds with the skin to continue killing germs even after washing.  Please DO NOT use if you have an allergy to CHG or antibacterial soaps.  If your skin becomes reddened/irritated stop using the CHG and inform your nurse when you arrive at Short Stay.  Do not shave (including legs and underarms) for at least 48 hours prior to the first CHG shower.  You may shave your  face.  Please follow these instructions carefully:   1.  Shower with CHG Soap the night before surgery and the morning of Surgery.  2.  If you choose to wash your hair, wash your hair first as usual with your normal shampoo.  3.  After you shampoo, rinse your hair and body thoroughly to remove the Shampoo.  4.  Use CHG as you would any other liquid soap. You can apply chg directly to the skin and wash gently with scrungie or a clean washcloth.  5.  Apply the CHG Soap to your body ONLY FROM THE NECK DOWN.  Do not use on open wounds or open sores.  Avoid contact with your eyes, ears, mouth and genitals (private parts).  Wash genitals (private parts with your normal soap.  6.  Wash thoroughly, paying special attention to the area where your surgery will be performed.  7.  Thoroughly rinse your body with warm water from the neck down.  8.  DO NOT shower/wash with your normal soap after using and rinsing off the CHG Soap.  9.  Pat yourself dry with a clean towel.  10.  Wear clean pajamas.            11.  Place clean sheets on your bed the night of your first shower and do not sleep with pets.  Day of Surgery  Do not apply any lotions/deodorants the morning of surgery.  Please wear clean clothes to the hospital/surgery center.   Please read over the following fact sheets that you were given: Pain Booklet, Coughing and Deep Breathing and Surgical Site Infection Prevention

## 2013-12-10 ENCOUNTER — Encounter (HOSPITAL_COMMUNITY)
Admission: RE | Admit: 2013-12-10 | Discharge: 2013-12-10 | Disposition: A | Discharge status: Home / Self Care | Payer: Medicare Other | Source: Ambulatory Visit | Attending: Vascular Surgery | Admitting: Vascular Surgery

## 2013-12-10 ENCOUNTER — Encounter (HOSPITAL_COMMUNITY): Payer: Self-pay

## 2013-12-10 DIAGNOSIS — Z01812 Encounter for preprocedural laboratory examination: Secondary | ICD-10-CM | POA: Insufficient documentation

## 2013-12-10 HISTORY — DX: Headache: R51

## 2013-12-10 HISTORY — DX: Angina pectoris, unspecified: I20.9

## 2013-12-10 HISTORY — DX: Other specified disorders of parathyroid gland: E21.4

## 2013-12-10 HISTORY — DX: Atherosclerotic heart disease of native coronary artery without angina pectoris: I25.10

## 2013-12-10 NOTE — Progress Notes (Signed)
12/10/13 1309  OBSTRUCTIVE SLEEP APNEA  Have you ever been diagnosed with sleep apnea through a sleep study? Yes (DOES NOT HAVE OR GIVEN CPAP)  If yes, do you have and use a CPAP or BPAP machine every night? 0  Do you snore loudly (loud enough to be heard through closed doors)?  1  Do you often feel tired, fatigued, or sleepy during the daytime? 1  Has anyone observed you stop breathing during your sleep? 0  Do you have, or are you being treated for high blood pressure? 1  BMI more than 35 kg/m2? 1  Age over 9 years old? 1  Neck circumference greater than 40 cm/18 inches? 0  Gender: 0  Obstructive Sleep Apnea Score 5  Score 4 or greater  Results sent to PCP

## 2013-12-10 NOTE — Progress Notes (Addendum)
REQUESTED CARDIAC CATH FROM 2013, ANY OV, OR CARDIAC STUDIES FROM DR. RAVENKAR IN [place].   PATIENT STATES SHE WAS TOLD TO STOP BRILINTA ON THE 17TH.

## 2013-12-12 NOTE — Progress Notes (Signed)
Anesthesia Chart Review: Patient is a 79 year old female scheduled for second stage left arm BVT on 12/17/13 by Dr. Scot Dock.  History includes former smoker, CAD/NSTEMI s/p DES RCA 11/28/11 ([place], [place]), CKD not yet on HD, HTN, CHF, dyslipidemia, PAD, DM2, OSA no CPAP, arthritis,CVA '10, renal cancer s/p left nephrectomy '05, hypothyroidism. Cardiologist is Dr. Jyl Heinz, last visit 11/15/13.  She was stable from a CAD standpoint at that time, and was aware of upcoming surgery.  Echo on 11/06/13 ([facility]) showed: Normal global and segmental systolic LV function, normal diastolic function for age. Mild MR.   According to her discharge summary:  Cardiac cath  from 11/28/11 showed "LM: ok LAD: 50% mid LCx: luminals RCA: 90% mid with thrombus." Successful PCI to mid RCA.  EKG on 01/09/13 showed SR with first degree AVB.   CXR on 01/09/13 showed No evidence of acute cardiopulmonary disease.  She is for ISTAT on arrival. If results are acceptable and no acute changes then I anticipate that she can proceed as planned.  George Hugh [facility] Short Stay Center/Anesthesiology Phone [phone] 12/12/2013 6:38 PM

## 2013-12-16 MED ORDER — CEFUROXIME SODIUM 1.5 G IJ SOLR
1.5000 g | INTRAMUSCULAR | Status: AC
  Administered 2013-12-17: 1.5 g via INTRAVENOUS
  Filled 2013-12-16: qty 1.5

## 2013-12-17 ENCOUNTER — Ambulatory Visit (HOSPITAL_COMMUNITY)
Admission: RE | Admit: 2013-12-17 | Discharge: 2013-12-17 | Disposition: A | Discharge status: Home / Self Care | Payer: Medicare Other | Source: Ambulatory Visit | Attending: Vascular Surgery | Admitting: Vascular Surgery

## 2013-12-17 ENCOUNTER — Other Ambulatory Visit: Payer: Self-pay

## 2013-12-17 ENCOUNTER — Encounter (HOSPITAL_COMMUNITY)
Admission: RE | Disposition: A | Discharge status: Home / Self Care | Payer: Self-pay | Source: Ambulatory Visit | Attending: Vascular Surgery

## 2013-12-17 ENCOUNTER — Encounter (HOSPITAL_COMMUNITY): Payer: Medicare Other | Admitting: Vascular Surgery

## 2013-12-17 ENCOUNTER — Ambulatory Visit (HOSPITAL_COMMUNITY): Payer: Medicare Other | Admitting: Vascular Surgery

## 2013-12-17 ENCOUNTER — Encounter (HOSPITAL_COMMUNITY): Payer: Self-pay | Admitting: Surgery

## 2013-12-17 DIAGNOSIS — I509 Heart failure, unspecified: Secondary | ICD-10-CM | POA: Insufficient documentation

## 2013-12-17 DIAGNOSIS — Z87891 Personal history of nicotine dependence: Secondary | ICD-10-CM | POA: Insufficient documentation

## 2013-12-17 DIAGNOSIS — N186 End stage renal disease: Secondary | ICD-10-CM

## 2013-12-17 DIAGNOSIS — Z905 Acquired absence of kidney: Secondary | ICD-10-CM | POA: Insufficient documentation

## 2013-12-17 DIAGNOSIS — I252 Old myocardial infarction: Secondary | ICD-10-CM | POA: Insufficient documentation

## 2013-12-17 DIAGNOSIS — Z85528 Personal history of other malignant neoplasm of kidney: Secondary | ICD-10-CM | POA: Insufficient documentation

## 2013-12-17 DIAGNOSIS — Z794 Long term (current) use of insulin: Secondary | ICD-10-CM | POA: Insufficient documentation

## 2013-12-17 DIAGNOSIS — G4733 Obstructive sleep apnea (adult) (pediatric): Secondary | ICD-10-CM | POA: Insufficient documentation

## 2013-12-17 DIAGNOSIS — E785 Hyperlipidemia, unspecified: Secondary | ICD-10-CM | POA: Insufficient documentation

## 2013-12-17 DIAGNOSIS — I2510 Atherosclerotic heart disease of native coronary artery without angina pectoris: Secondary | ICD-10-CM | POA: Insufficient documentation

## 2013-12-17 DIAGNOSIS — E039 Hypothyroidism, unspecified: Secondary | ICD-10-CM | POA: Insufficient documentation

## 2013-12-17 DIAGNOSIS — N184 Chronic kidney disease, stage 4 (severe): Secondary | ICD-10-CM

## 2013-12-17 DIAGNOSIS — I440 Atrioventricular block, first degree: Secondary | ICD-10-CM | POA: Insufficient documentation

## 2013-12-17 DIAGNOSIS — Z9861 Coronary angioplasty status: Secondary | ICD-10-CM | POA: Insufficient documentation

## 2013-12-17 DIAGNOSIS — Z79899 Other long term (current) drug therapy: Secondary | ICD-10-CM | POA: Insufficient documentation

## 2013-12-17 DIAGNOSIS — Z48812 Encounter for surgical aftercare following surgery on the circulatory system: Secondary | ICD-10-CM

## 2013-12-17 DIAGNOSIS — I129 Hypertensive chronic kidney disease with stage 1 through stage 4 chronic kidney disease, or unspecified chronic kidney disease: Secondary | ICD-10-CM | POA: Insufficient documentation

## 2013-12-17 DIAGNOSIS — E119 Type 2 diabetes mellitus without complications: Secondary | ICD-10-CM | POA: Insufficient documentation

## 2013-12-17 HISTORY — PX: BASCILIC VEIN TRANSPOSITION: SHX5742

## 2013-12-17 LAB — GLUCOSE, CAPILLARY
GLUCOSE-CAPILLARY: 164 mg/dL — AB (ref 70–99)
GLUCOSE-CAPILLARY: 197 mg/dL — AB (ref 70–99)
Glucose-Capillary: 183 mg/dL — ABNORMAL HIGH (ref 70–99)

## 2013-12-17 LAB — POCT I-STAT 4, (NA,K, GLUC, HGB,HCT)
GLUCOSE: 182 mg/dL — AB (ref 70–99)
HEMATOCRIT: 38 % (ref 36.0–46.0)
Hemoglobin: 12.9 g/dL (ref 12.0–15.0)
Potassium: 4.7 mEq/L (ref 3.7–5.3)
Sodium: 140 mEq/L (ref 137–147)

## 2013-12-17 SURGERY — TRANSPOSITION, VEIN, BASILIC
Anesthesia: General | Site: Arm Upper | Laterality: Left

## 2013-12-17 MED ORDER — PROMETHAZINE HCL 25 MG/ML IJ SOLN: 6.2500 mg | INTRAMUSCULAR | Status: DC | PRN

## 2013-12-17 MED ORDER — EPHEDRINE SULFATE 50 MG/ML IJ SOLN
INTRAMUSCULAR | Status: DC | PRN
  Administered 2013-12-17 (×2): 10 mg via INTRAVENOUS

## 2013-12-17 MED ORDER — PHENYLEPHRINE 40 MCG/ML (10ML) SYRINGE FOR IV PUSH (FOR BLOOD PRESSURE SUPPORT)
PREFILLED_SYRINGE | INTRAVENOUS | Status: AC
  Filled 2013-12-17: qty 10

## 2013-12-17 MED ORDER — OXYCODONE HCL 5 MG/5ML PO SOLN: 5.0000 mg | Freq: Once | ORAL | Status: AC | PRN

## 2013-12-17 MED ORDER — LIDOCAINE HCL (CARDIAC) 20 MG/ML IV SOLN
INTRAVENOUS | Status: AC
  Filled 2013-12-17: qty 5

## 2013-12-17 MED ORDER — 0.9 % SODIUM CHLORIDE (POUR BTL) OPTIME
TOPICAL | Status: DC | PRN
  Administered 2013-12-17: 1000 mL

## 2013-12-17 MED ORDER — OXYCODONE HCL 5 MG PO TABS: 5.0000 mg | ORAL_TABLET | Freq: Four times a day (QID) | ORAL | Status: DC | PRN

## 2013-12-17 MED ORDER — EPHEDRINE SULFATE 50 MG/ML IJ SOLN
INTRAMUSCULAR | Status: AC
  Filled 2013-12-17: qty 1

## 2013-12-17 MED ORDER — HYDROMORPHONE HCL PF 1 MG/ML IJ SOLN
0.2500 mg | INTRAMUSCULAR | Status: DC | PRN
  Administered 2013-12-17 (×3): 0.25 mg via INTRAVENOUS

## 2013-12-17 MED ORDER — FENTANYL CITRATE 0.05 MG/ML IJ SOLN
INTRAMUSCULAR | Status: AC
  Filled 2013-12-17: qty 5

## 2013-12-17 MED ORDER — PROPOFOL 10 MG/ML IV BOLUS
INTRAVENOUS | Status: DC | PRN
Start: 2013-12-17 — End: 2013-12-17
  Administered 2013-12-17: 150 mg via INTRAVENOUS

## 2013-12-17 MED ORDER — SODIUM CHLORIDE 0.9 % IJ SOLN
INTRAMUSCULAR | Status: AC
  Filled 2013-12-17: qty 10

## 2013-12-17 MED ORDER — LIDOCAINE HCL (PF) 1 % IJ SOLN
INTRAMUSCULAR | Status: AC
  Filled 2013-12-17: qty 30

## 2013-12-17 MED ORDER — OXYCODONE HCL 5 MG PO TABS
ORAL_TABLET | ORAL | Status: AC
  Filled 2013-12-17: qty 1

## 2013-12-17 MED ORDER — CHLORHEXIDINE GLUCONATE CLOTH 2 % EX PADS: 6.0000 | MEDICATED_PAD | Freq: Once | CUTANEOUS | Status: DC

## 2013-12-17 MED ORDER — MIDAZOLAM HCL 2 MG/2ML IJ SOLN
INTRAMUSCULAR | Status: AC
  Filled 2013-12-17: qty 2

## 2013-12-17 MED ORDER — THROMBIN 20000 UNITS EX SOLR
CUTANEOUS | Status: AC
  Filled 2013-12-17: qty 20000

## 2013-12-17 MED ORDER — LIDOCAINE HCL (CARDIAC) 20 MG/ML IV SOLN
INTRAVENOUS | Status: DC | PRN
  Administered 2013-12-17: 100 mg via INTRAVENOUS

## 2013-12-17 MED ORDER — ONDANSETRON HCL 4 MG/2ML IJ SOLN
INTRAMUSCULAR | Status: DC | PRN
  Administered 2013-12-17: 4 mg via INTRAVENOUS

## 2013-12-17 MED ORDER — HYDROMORPHONE HCL PF 1 MG/ML IJ SOLN
INTRAMUSCULAR | Status: AC
  Filled 2013-12-17: qty 1

## 2013-12-17 MED ORDER — FENTANYL CITRATE 0.05 MG/ML IJ SOLN
INTRAMUSCULAR | Status: DC | PRN
  Administered 2013-12-17: 100 ug via INTRAVENOUS

## 2013-12-17 MED ORDER — OXYCODONE HCL 5 MG PO TABS
5.0000 mg | ORAL_TABLET | Freq: Once | ORAL | Status: AC | PRN
  Administered 2013-12-17: 5 mg via ORAL

## 2013-12-17 MED ORDER — MIDAZOLAM HCL 5 MG/5ML IJ SOLN
INTRAMUSCULAR | Status: DC | PRN
  Administered 2013-12-17: 1 mg via INTRAVENOUS

## 2013-12-17 MED ORDER — PROTAMINE SULFATE 10 MG/ML IV SOLN
INTRAVENOUS | Status: DC | PRN
  Administered 2013-12-17: 10 mg via INTRAVENOUS
  Administered 2013-12-17 (×2): 15 mg via INTRAVENOUS

## 2013-12-17 MED ORDER — SUCCINYLCHOLINE CHLORIDE 20 MG/ML IJ SOLN
INTRAMUSCULAR | Status: AC
  Filled 2013-12-17: qty 1

## 2013-12-17 MED ORDER — PROTAMINE SULFATE 10 MG/ML IV SOLN
INTRAVENOUS | Status: AC
  Filled 2013-12-17: qty 5

## 2013-12-17 MED ORDER — ONDANSETRON HCL 4 MG/2ML IJ SOLN
INTRAMUSCULAR | Status: AC
  Filled 2013-12-17: qty 2

## 2013-12-17 MED ORDER — HEPARIN SODIUM (PORCINE) 1000 UNIT/ML IJ SOLN
INTRAMUSCULAR | Status: AC
  Filled 2013-12-17: qty 1

## 2013-12-17 MED ORDER — SODIUM CHLORIDE 0.9 % IV SOLN: INTRAVENOUS | Status: DC

## 2013-12-17 MED ORDER — SODIUM CHLORIDE 0.9 % IV SOLN
INTRAVENOUS | Status: DC
  Administered 2013-12-17: 07:00:00 via INTRAVENOUS

## 2013-12-17 MED ORDER — CHLORHEXIDINE GLUCONATE CLOTH 2 % EX PADS
6.0000 | MEDICATED_PAD | Freq: Once | CUTANEOUS | Status: DC
Start: 2013-12-17 — End: 2013-12-17

## 2013-12-17 MED ORDER — SODIUM CHLORIDE 0.9 % IR SOLN
Status: DC | PRN
  Administered 2013-12-17: 08:00:00

## 2013-12-17 MED ORDER — PROPOFOL 10 MG/ML IV BOLUS
INTRAVENOUS | Status: AC
  Filled 2013-12-17: qty 20

## 2013-12-17 MED ORDER — HEPARIN SODIUM (PORCINE) 1000 UNIT/ML IJ SOLN
INTRAMUSCULAR | Status: DC | PRN
Start: 2013-12-17 — End: 2013-12-17
  Administered 2013-12-17: 7000 [IU] via INTRAVENOUS

## 2013-12-17 SURGICAL SUPPLY — 40 items
ADH SKN CLS APL DERMABOND .7 (GAUZE/BANDAGES/DRESSINGS) ×1
CANISTER SUCTION 2500CC (MISCELLANEOUS) ×3 IMPLANT
CLIP TI MEDIUM 24 (CLIP) ×3 IMPLANT
CLIP TI WIDE RED SMALL 24 (CLIP) ×3 IMPLANT
COVER PROBE W GEL 5X96 (DRAPES) ×3 IMPLANT
COVER SURGICAL LIGHT HANDLE (MISCELLANEOUS) ×3 IMPLANT
DECANTER SPIKE VIAL GLASS SM (MISCELLANEOUS) ×3 IMPLANT
DERMABOND ADVANCED (GAUZE/BANDAGES/DRESSINGS) ×2
DERMABOND ADVANCED .7 DNX12 (GAUZE/BANDAGES/DRESSINGS) ×1 IMPLANT
DRAIN PENROSE 1/2X12 LTX STRL (WOUND CARE) IMPLANT
ELECT REM PT RETURN 9FT ADLT (ELECTROSURGICAL) ×3
ELECTRODE REM PT RTRN 9FT ADLT (ELECTROSURGICAL) ×1 IMPLANT
GAUZE SPONGE 4X4 16PLY XRAY LF (GAUZE/BANDAGES/DRESSINGS) ×2 IMPLANT
GLOVE BIO SURGEON STRL SZ 6.5 (GLOVE) ×2 IMPLANT
GLOVE BIO SURGEON STRL SZ7.5 (GLOVE) ×5 IMPLANT
GLOVE BIO SURGEONS STRL SZ 6.5 (GLOVE) ×2
GLOVE BIOGEL PI IND STRL 7.5 (GLOVE) IMPLANT
GLOVE BIOGEL PI IND STRL 8 (GLOVE) ×1 IMPLANT
GLOVE BIOGEL PI INDICATOR 7.5 (GLOVE) ×2
GLOVE BIOGEL PI INDICATOR 8 (GLOVE) ×4
GLOVE SURG SS PI 7.0 STRL IVOR (GLOVE) ×2 IMPLANT
GOWN STRL REUS W/ TWL LRG LVL3 (GOWN DISPOSABLE) ×3 IMPLANT
GOWN STRL REUS W/TWL LRG LVL3 (GOWN DISPOSABLE) ×9
KIT BASIN OR (CUSTOM PROCEDURE TRAY) ×3 IMPLANT
KIT ROOM TURNOVER OR (KITS) ×3 IMPLANT
NS IRRIG 1000ML POUR BTL (IV SOLUTION) ×3 IMPLANT
PACK CV ACCESS (CUSTOM PROCEDURE TRAY) ×3 IMPLANT
PAD ARMBOARD 7.5X6 YLW CONV (MISCELLANEOUS) ×6 IMPLANT
SPONGE SURGIFOAM ABS GEL 100 (HEMOSTASIS) IMPLANT
SUT PROLENE 6 0 BV (SUTURE) ×3 IMPLANT
SUT SILK 2 0 SH (SUTURE) ×3 IMPLANT
SUT SILK 3 0 (SUTURE) ×3
SUT SILK 3-0 18XBRD TIE 12 (SUTURE) IMPLANT
SUT VIC AB 3-0 SH 27 (SUTURE) ×6
SUT VIC AB 3-0 SH 27X BRD (SUTURE) ×1 IMPLANT
SUT VICRYL 4-0 PS2 18IN ABS (SUTURE) ×5 IMPLANT
TOWEL OR 17X24 6PK STRL BLUE (TOWEL DISPOSABLE) ×3 IMPLANT
TOWEL OR 17X26 10 PK STRL BLUE (TOWEL DISPOSABLE) ×3 IMPLANT
UNDERPAD 30X30 INCONTINENT (UNDERPADS AND DIAPERS) ×3 IMPLANT
WATER STERILE IRR 1000ML POUR (IV SOLUTION) ×1 IMPLANT

## 2013-12-17 NOTE — Progress Notes (Signed)
Report given to lauren rn as caregiver

## 2013-12-17 NOTE — Discharge Instructions (Signed)
° ° °  12/17/2013 [name] [id number] [birth year]  Surgeon(s): Angelia Mould, MD  Procedure(s): BASILIC VEIN TRANSPOSITION - LEFT ARM 2ND STAGE  x Do not stick graft for 8 weeks   What to eat:  For your first meals, you should eat lightly; only small meals initially.  If you do not have nausea, you may eat larger meals.  Avoid spicy, greasy and heavy food.    General Anesthesia, Adult, Care After  Refer to this sheet in the next few weeks. These instructions provide you with information on caring for yourself after your procedure. Your health care provider may also give you more specific instructions. Your treatment has been planned according to current medical practices, but problems sometimes occur. Call your health care provider if you have any problems or questions after your procedure.  WHAT TO EXPECT AFTER THE PROCEDURE  After the procedure, it is typical to experience:  Sleepiness.  Nausea and vomiting. HOME CARE INSTRUCTIONS  For the first 24 hours after general anesthesia:  Have a responsible person with you.  Do not drive a car. If you are alone, do not take public transportation.  Do not drink alcohol.  Do not take medicine that has not been prescribed by your health care provider.  Do not sign important papers or make important decisions.  You may resume a normal diet and activities as directed by your health care provider.  Change bandages (dressings) as directed.  If you have questions or problems that seem related to general anesthesia, call the hospital and ask for the anesthetist or anesthesiologist on call. SEEK MEDICAL CARE IF:  You have nausea and vomiting that continue the day after anesthesia.  You develop a rash. SEEK IMMEDIATE MEDICAL CARE IF:  You have difficulty breathing.  You have chest pain.  You have any allergic problems. Document Released: 11/21/2000 Document Revised: 04/17/2013 Document Reviewed: 02/28/2013  [facility] Patient  Information 2014 [place], [place].

## 2013-12-17 NOTE — Anesthesia Preprocedure Evaluation (Addendum)
Anesthesia Evaluation    Reviewed: Allergy & Precautions, H&P , NPO status , Patient's Chart, lab work & pertinent test results, reviewed documented beta blocker date and time   History of Anesthesia Complications Negative for: history of anesthetic complications  Airway       Dental   Pulmonary sleep apnea , former smoker,          Cardiovascular hypertension, Pt. on home beta blockers + angina + CAD, + Past MI, + Peripheral Vascular Disease and +CHF  Echo on 11/06/13 ([facility]) showed: Normal global and segmental systolic LV function, normal diastolic function for age. Mild MR   Neuro/Psych    GI/Hepatic   Endo/Other  diabetes, Insulin Dependent  Renal/GU CRFRenal disease     Musculoskeletal   Abdominal   Peds  Hematology   Anesthesia Other Findings   Reproductive/Obstetrics                          Anesthesia Physical Anesthesia Plan  ASA: III  Anesthesia Plan: General   Post-op Pain Management:    Induction: Intravenous  Airway Management Planned: LMA  Additional Equipment:   Intra-op Plan:   Post-operative Plan: Extubation in OR  Informed Consent:   Plan Discussed with: CRNA, Anesthesiologist and Surgeon  Anesthesia Plan Comments:         Anesthesia Quick Evaluation

## 2013-12-17 NOTE — Anesthesia Procedure Notes (Signed)
Procedure Name: LMA Insertion Date/Time: 12/17/2013 7:31 AM Performed by: Julian Reil Pre-anesthesia Checklist: Patient identified, Emergency Drugs available, Patient being monitored and Suction available Patient Re-evaluated:Patient Re-evaluated prior to inductionOxygen Delivery Method: Circle system utilized Preoxygenation: Pre-oxygenation with 100% oxygen Intubation Type: IV induction LMA: LMA inserted LMA Size: 4.0 Tube type: Oral Number of attempts: 1 Placement Confirmation: positive ETCO2 and breath sounds checked- equal and bilateral Tube secured with: Tape Dental Injury: Teeth and Oropharynx as per pre-operative assessment

## 2013-12-17 NOTE — Op Note (Signed)
    NAME: [name]   MRN: [id number] DOB: 1935/03/30    DATE OF OPERATION: 12/17/2013  PREOP DIAGNOSIS: Stage IV chronic kidney disease  POSTOP DIAGNOSIS: same  PROCEDURE: Second stage basilic vein transposition (LEFT)  SURGEON: Judeth Cornfield. Scot Dock, MD, FACS  ASSIST: [name], PA  ANESTHESIA: Gen.   EBL: minimal  INDICATIONS: [name] is a 79 y.o. female who is not yet on dialysis. She had a first stage basilic vein transposition as the vein was quite marginal. The vein improved in size and she presents for the second stage.  FINDINGS: there was significant intimal hyperplasia in the proximal fistula. This area was excised.  TECHNIQUE: The patient was taken to the operating room and received a general anesthetic. The left upper extremity was prepped and draped in the usual sterile fashion. The basilic vein was identified with the duplex scanner. A longitudinal incision was made over the vein just above the antecubital level. Here the vein was completely dissected free with branches divided between clips and 3-0 silk ties. The dissection was carried down to where the vein was anastomosed to the brachial artery. There appeared to be significant intimal hyperplasia in the proximal fistula over a length of approximately 2-1/2 cm. Using one additional incision in the upper arm the entire basilic vein was harvested up to the axilla. Again branches were divided between clips and silk ties. The vein was clamped adjacent to the anastomosis to the brachial artery and then excised. It was then distended with heparinized saline and marked to prevent twisting. There was one small area that appeared narrowed and this was dilated up to a 4 mm dilator with a good result. There was significant intimal hyperplasia in the proximal fistula. A tunnel was then created between the 2 incisions and the patient was heparinized. The brachial artery was clamped proximally and distally and the  vein excised from the brachial artery. The arteriotomy was extended proximally. Vein was then spatulated beyond the area of significant intimal hyperplasia and tailored for anastomosis to the brachial artery. The anastomosis was done end to side using continuous 6-0 Prolene suture. At the completion was excellent thrill in the fistula which was easily palpable. There was a palpable left radial pulse. The heparin was partially reversed with protamine. The wounds were closed with deep layer of 3-0 Vicryl the skin closed with 4-0 Vicryl. Dermabond was applied. The patient tolerated the procedure well and was transferred to the recovery room in stable condition. All needle and sponge counts were correct.  [name], MD, FACS [facility]  DATE OF DICTATION:   12/17/2013

## 2013-12-17 NOTE — Transfer of Care (Signed)
Immediate Anesthesia Transfer of Care Note  Patient: [name]  Procedure(s) Performed: Procedure(s): BASILIC VEIN TRANSPOSITION - LEFT ARM 2ND STAGE (Left)  Patient Location: PACU  Anesthesia Type:General  Level of Consciousness: sedated and responds to stimulation  Airway & Oxygen Therapy: Patient Spontanous Breathing and Patient connected to nasal cannula oxygen  Post-op Assessment: Report given to PACU RN, Post -op Vital signs reviewed and stable and Patient moving all extremities  Post vital signs: Reviewed and stable  Complications: No apparent anesthesia complications

## 2013-12-17 NOTE — Anesthesia Postprocedure Evaluation (Signed)
Anesthesia Post Note  Patient: [name]  Procedure(s) Performed: Procedure(s) (LRB): BASILIC VEIN TRANSPOSITION - LEFT ARM 2ND STAGE (Left)  Anesthesia type: general  Patient location: PACU  Post pain: Pain level controlled  Post assessment: Patient's Cardiovascular Status Stable  Last Vitals:  Filed Vitals:   12/17/13 1100  BP: 113/45  Pulse:   Temp:   Resp:     Post vital signs: Reviewed and stable  Level of consciousness: sedated  Complications: No apparent anesthesia complications

## 2013-12-17 NOTE — H&P (Signed)
    Patient name: [name] MRN: [id number] DOB: 1934/10/17 Sex: female   HPI:  [name] is a 79 y.o. female who had a left brachiocephalic AV fistula placed by Dr. Kellie Simmering on 01/09/2013. I saw her on 07/17/2013 at that time her AV fistula was clotted. Her basilic vein looks very small but I recommended exploration and possible first stage basilic vein transposition. On 07/30/13, she underwent a first stage basilic vein transposition in the left arm. She comes in for a 6 week follow up visit. Of note in reviewing her operative note the vein was only about 3 mm. She is not yet on dialysis. She denies any recent uremic symptoms.   REVIEW OF SYSTEMS: Valu.Nieves ] denotes positive finding; [ ]  denotes negative finding  CARDIOVASCULAR: [ ]  chest pain [ ]  dyspnea on exertion  CONSTITUTIONAL: [ ]  fever [ ]  chills   PHYSICAL EXAM:  Filed Vitals:   12/17/13 0621  BP: 154/54  Pulse: 55  Temp: 98.1 F (36.7 C)  Resp: 20   Filed Vitals:    10/09/13 1507   BP:  181/63   Pulse:  59   Height:  5' (1.524 m)   Weight:  207 lb (93.895 kg)   SpO2:  90%   Body mass index is 40.43 kg/(m^2).  GENERAL: The patient is a well-nourished female, in no acute distress. The vital signs are documented above.  CARDIOVASCULAR: There is a regular rate and rhythm.  PULMONARY: There is good air exchange bilaterally without wheezing or rales.  She has a palpable thrill in her AV fistula in the left upper arm. She has a palpable left radial pulse.   DUPLEX OF AV FISTULA: The fistula is patent with an area of increased velocities in the proximal fistula. Diameters of the vein range from 0.46 cm-0.58 cm.   MEDICAL ISSUES:  End stage renal disease  I think that there is a reasonable chance that her basilic vein transposition will be usable. For this reason I have recommended that we proceed with a second stage. She does have some narrowing in the proximal fistula which I will explore the time of her  transposition. She is under length of which we will stop 5 days preoperatively. Her surgery is scheduled for 10/22/2013.   [place]  [facility]   Beeper: [phone]

## 2013-12-18 ENCOUNTER — Telehealth: Payer: Self-pay | Admitting: Vascular Surgery

## 2013-12-18 NOTE — Telephone Encounter (Addendum)
Message copied by Gena Fray on Wed Dec 18, 2013 10:44 AM ------      Message from: Denman George      Created: Tue Dec 17, 2013  9:49 AM      Regarding: Micheline Rough; needs f/u appt with lab                   ----- Message -----         From: Gabriel Earing, PA-C         Sent: 12/17/2013   9:43 AM           To: V[facility] Charge Pool            S/p 2nd stage left BVT 12/17/13.  F/u with Dr. Scot Dock in 6 weeks with duplex.            Thanks,      Samantha ------  12/17/13: spoke with patient re appt, dpm

## 2013-12-19 ENCOUNTER — Encounter (HOSPITAL_COMMUNITY): Payer: Self-pay | Admitting: Vascular Surgery

## 2014-01-28 ENCOUNTER — Encounter: Payer: Self-pay | Admitting: Vascular Surgery

## 2014-01-29 ENCOUNTER — Encounter: Payer: Self-pay | Admitting: Vascular Surgery

## 2014-01-29 ENCOUNTER — Ambulatory Visit (INDEPENDENT_AMBULATORY_CARE_PROVIDER_SITE_OTHER): Payer: Self-pay | Admitting: Vascular Surgery

## 2014-01-29 ENCOUNTER — Other Ambulatory Visit: Payer: Self-pay | Admitting: Vascular Surgery

## 2014-01-29 ENCOUNTER — Ambulatory Visit (HOSPITAL_COMMUNITY)
Admission: RE | Admit: 2014-01-29 | Discharge: 2014-01-29 | Disposition: A | Discharge status: Home / Self Care | Payer: Medicare Other | Source: Ambulatory Visit | Attending: Vascular Surgery | Admitting: Vascular Surgery

## 2014-01-29 VITALS — BP 166/64 | HR 68 | Ht 61.0 in | Wt 206.8 lb

## 2014-01-29 DIAGNOSIS — N186 End stage renal disease: Secondary | ICD-10-CM

## 2014-01-29 DIAGNOSIS — Z4931 Encounter for adequacy testing for hemodialysis: Secondary | ICD-10-CM

## 2014-01-29 DIAGNOSIS — Z48812 Encounter for surgical aftercare following surgery on the circulatory system: Secondary | ICD-10-CM

## 2014-01-29 DIAGNOSIS — N184 Chronic kidney disease, stage 4 (severe): Secondary | ICD-10-CM

## 2014-01-29 NOTE — Progress Notes (Signed)
[facility] POST OPERATIVE OFFICE NOTE  CC:  F/u for surgery  HPI:  This is a 79 y.o. female who is s/p left 2nd stage BVT on 12/17/13.  She is not yet on HD.   She denies any symptoms of steal. She states that she had some swelling in her left arm last week, but this has continued to improve.  Otherwise, no complaints.   Allergies  Allergen Reactions  . Macrobid [Nitrofurantoin Macrocrystal] Anaphylaxis and Rash  . Influenza Vaccines Other (See Comments)    blisters    Current Outpatient Prescriptions  Medication Sig Dispense Refill  . acetaminophen (TYLENOL) 500 MG tablet Take 1,000 mg by mouth 3 (three) times daily as needed for mild pain.       . albuterol (PROVENTIL HFA;VENTOLIN HFA) 108 (90 BASE) MCG/ACT inhaler Inhale 2 puffs into the lungs every 6 (six) hours as needed for wheezing or shortness of breath.      . albuterol (PROVENTIL) (2.5 MG/3ML) 0.083% nebulizer solution Take 2.5 mg by nebulization every 6 (six) hours as needed for wheezing or shortness of breath.       . amLODipine (NORVASC) 5 MG tablet Take 5 mg by mouth daily.      . aspirin EC 81 MG tablet Take 81 mg by mouth every evening.      . calcitRIOL (ROCALTROL) 0.25 MCG capsule Take 0.25 mcg by mouth 3 (three) times a week. Sunday, Wednesday, and Friday      . clotrimazole-betamethasone (LOTRISONE) cream Apply 1 application topically 2 (two) times daily as needed (for rash).       . furosemide (LASIX) 40 MG tablet Take 40 mg by mouth daily.      . gabapentin (NEURONTIN) 400 MG capsule Take 400 mg by mouth 3 (three) times daily.      . insulin aspart (NOVOLOG FLEXPEN) 100 UNIT/ML FlexPen Inject 9-22 Units into the skin 3 (three) times daily with meals. Sliding scale      . insulin glargine (LANTUS) 100 UNIT/ML injection Inject 50 Units into the skin at bedtime.       . isosorbide mononitrate (IMDUR) 60 MG 24 hr tablet Take 120 mg by mouth every morning.      . levothyroxine (SYNTHROID, LEVOTHROID)  75 MCG tablet Take 88 mcg by mouth daily before breakfast.       . metolazone (ZAROXOLYN) 5 MG tablet Take 5 mg by mouth every other day.      . metoprolol succinate (TOPROL-XL) 100 MG 24 hr tablet Take 100 mg by mouth 2 (two) times daily. Take with or immediately following a meal.      . nitroGLYCERIN (NITROSTAT) 0.4 MG SL tablet Place 0.4 mg under the tongue every 5 (five) minutes as needed for chest pain.      . Omega-3 Fatty Acids (FISH OIL) 1000 MG CAPS Take 1,000 mg by mouth 2 (two) times daily.      . oxyCODONE (ROXICODONE) 5 MG immediate release tablet Take 1 tablet (5 mg total) by mouth every 6 (six) hours as needed for severe pain.  30 tablet  0  . pravastatin (PRAVACHOL) 20 MG tablet Take 10 mg by mouth daily.       . ranolazine (RANEXA) 500 MG 12 hr tablet Take 500 mg by mouth 2 (two) times daily.      . Ticagrelor (BRILINTA) 90 MG TABS tablet Take 1 tablet (90 mg total) by mouth 2 (two) times daily.  60 tablet       No current facility-administered medications for this visit.     ROS:  See HPI  Physical Exam:  Filed Vitals:   01/29/14 1623  BP: 166/64  Pulse: 68    Incision:  Are all healed nicely. Extremities:  2+ left radial pulse.  +thrill and bruit in fistula.  There is some edema in the left arm.  AVF duplex 01/29/14: 1.  Patent left arterial inflow, no hemodynamically significant changes present 2.  Elevated velocities with prominent valve leaflet and diameter changes present suggestive of hemodynamically significant stenosis. 3.  Remainder of the left venous outflow is patent to the level of the axilla, no hemodynamically significant changes are evident.  A/P:  This is a 79 y.o. female here for f/u to her 2nd stage left BVT  -there is some concern with significant stenosis within the fistula -will schedule pt for a fistulogram on 02/10/14. -she is on Brilinta-Spoke with Dr. Dickson and she will continue taking this medication. -she has had some swelling in the left  arm, but given that this has improved, doubt she has a central venous stenosis.  Pt advised to elevate arm.  Dontavius Keim, PA-C [facility] [phone]  Clinic MD:  Pt seen and examined with Dr. Dickson  Agree with above. I am most concerned about some significantly elevated velocities in the proximal fistula just above the antecubital level. This reason I recommended a fistulogram. We will also try to limit dye given that she is not yet on dialysis. If the stenosis is significant, it could possibly be addressed with venoplasty.  Christopher Dickson, MD, FACS [facility] [phone] 01/29/2014  

## 2014-01-30 ENCOUNTER — Other Ambulatory Visit: Payer: Self-pay | Admitting: *Deleted

## 2014-02-05 ENCOUNTER — Encounter (HOSPITAL_COMMUNITY): Payer: Self-pay | Admitting: Pharmacy Technician

## 2014-02-10 ENCOUNTER — Ambulatory Visit (HOSPITAL_COMMUNITY)
Admission: RE | Admit: 2014-02-10 | Discharge: 2014-02-10 | Disposition: A | Discharge status: Home / Self Care | Payer: Medicare Other | Source: Ambulatory Visit | Attending: Vascular Surgery | Admitting: Vascular Surgery

## 2014-02-10 ENCOUNTER — Telehealth: Payer: Self-pay | Admitting: Vascular Surgery

## 2014-02-10 ENCOUNTER — Encounter (HOSPITAL_COMMUNITY)
Admission: RE | Disposition: A | Discharge status: Home / Self Care | Payer: Self-pay | Source: Ambulatory Visit | Attending: Vascular Surgery

## 2014-02-10 ENCOUNTER — Other Ambulatory Visit: Payer: Self-pay | Admitting: *Deleted

## 2014-02-10 DIAGNOSIS — M7989 Other specified soft tissue disorders: Secondary | ICD-10-CM | POA: Insufficient documentation

## 2014-02-10 DIAGNOSIS — T82898A Other specified complication of vascular prosthetic devices, implants and grafts, initial encounter: Secondary | ICD-10-CM

## 2014-02-10 DIAGNOSIS — N186 End stage renal disease: Secondary | ICD-10-CM | POA: Insufficient documentation

## 2014-02-10 DIAGNOSIS — Z7902 Long term (current) use of antithrombotics/antiplatelets: Secondary | ICD-10-CM | POA: Insufficient documentation

## 2014-02-10 DIAGNOSIS — Z4931 Encounter for adequacy testing for hemodialysis: Secondary | ICD-10-CM

## 2014-02-10 DIAGNOSIS — Z794 Long term (current) use of insulin: Secondary | ICD-10-CM | POA: Insufficient documentation

## 2014-02-10 DIAGNOSIS — R609 Edema, unspecified: Secondary | ICD-10-CM | POA: Insufficient documentation

## 2014-02-10 DIAGNOSIS — Z7982 Long term (current) use of aspirin: Secondary | ICD-10-CM | POA: Insufficient documentation

## 2014-02-10 HISTORY — PX: SHUNTOGRAM: SHX5491

## 2014-02-10 LAB — POCT I-STAT, CHEM 8
BUN: 74 mg/dL — ABNORMAL HIGH (ref 6–23)
Calcium, Ion: 1.28 mmol/L (ref 1.13–1.30)
Chloride: 109 mEq/L (ref 96–112)
Creatinine, Ser: 4.3 mg/dL — ABNORMAL HIGH (ref 0.50–1.10)
GLUCOSE: 191 mg/dL — AB (ref 70–99)
HEMATOCRIT: 35 % — AB (ref 36.0–46.0)
HEMOGLOBIN: 11.9 g/dL — AB (ref 12.0–15.0)
Potassium: 4.9 mEq/L (ref 3.7–5.3)
Sodium: 143 mEq/L (ref 137–147)
TCO2: 20 mmol/L (ref 0–100)

## 2014-02-10 LAB — GLUCOSE, CAPILLARY: Glucose-Capillary: 172 mg/dL — ABNORMAL HIGH (ref 70–99)

## 2014-02-10 SURGERY — ASSESSMENT, SHUNT FUNCTION, WITH CONTRAST RADIOGRAPHIC STUDY
Anesthesia: LOCAL | Laterality: Left

## 2014-02-10 MED ORDER — SODIUM CHLORIDE 0.9 % IV SOLN: 250.0000 mL | INTRAVENOUS | Status: DC | PRN

## 2014-02-10 MED ORDER — HEPARIN (PORCINE) IN NACL 2-0.9 UNIT/ML-% IJ SOLN
INTRAMUSCULAR | Status: AC
  Filled 2014-02-10: qty 500

## 2014-02-10 MED ORDER — SODIUM CHLORIDE 0.9 % IJ SOLN: 3.0000 mL | Freq: Two times a day (BID) | INTRAMUSCULAR | Status: DC

## 2014-02-10 MED ORDER — SODIUM CHLORIDE 0.9 % IJ SOLN: 3.0000 mL | INTRAMUSCULAR | Status: DC | PRN

## 2014-02-10 MED ORDER — LIDOCAINE HCL (PF) 1 % IJ SOLN
INTRAMUSCULAR | Status: AC
  Filled 2014-02-10: qty 30

## 2014-02-10 NOTE — Telephone Encounter (Addendum)
Message copied by Gena Fray on Mon Feb 10, 2014  1:54 PM ------      Message from: Peter Minium K      Created: Mon Feb 10, 2014 11:57 AM      Regarding: Schedule                   ----- Message -----         From: Angelia Mould, MD         Sent: 02/10/2014  11:24 AM           To: [facility] Charge Pool      Subject: charge and f/u                                           PROCEDURE:       1. Ultrasound-guided access to left basilic vein transposition      2. fistulogram left basilic vein transposition            SURGEON: Judeth Cornfield. Scot Dock, MD, FACS            She will need a follow up in 6 weeks with the duplex at that time to check on the maturation of the fistula. Thank you. CD ------  02/10/14: lm for pt re appt, dpm

## 2014-02-10 NOTE — Interval H&P Note (Signed)
History and Physical Interval Note:  02/10/2014 10:29 AM  [name]  has presented today for surgery, with the diagnosis of END STAGE RENAL  The various methods of treatment have been discussed with the patient and family. After consideration of risks, benefits and other options for treatment, the patient has consented to  Procedure(s): FISTULOGRAM (Left) as a surgical intervention .  The patient's history has been reviewed, patient examined, no change in status, stable for surgery.  I have reviewed the patient's chart and labs.  Questions were answered to the patient's satisfaction.     Micha Erck S

## 2014-02-10 NOTE — Discharge Instructions (Signed)
AV Fistula °Care After °Refer to this sheet in the next few weeks. These instructions provide you with information on caring for yourself after your procedure. Your caregiver may also give you more specific instructions. Your treatment has been planned according to current medical practices, but problems sometimes occur. Call your caregiver if you have any problems or questions after your procedure. °HOME CARE INSTRUCTIONS  °· Do not drive a car or take public transportation alone. °· Do not drink alcohol. °· Only take medicine that has been prescribed by your caregiver. °· Do not sign important papers or make important decisions. °· Have a responsible person with you. °· Ask your caregiver to show you how to check your access at home for a vibration (called a "thrill") or for a sound (called a "bruit" pronounced brew-ee). °· Your vein will need time to enlarge and mature so needles can be inserted for dialysis. Follow your caregiver's instructions about what you need to do to make this happen. °· Keep dressings clean and dry. °· Keep the arm elevated above your heart. Use a pillow. °· Rest. °· Use the arm as usual for all activities. °· Have the stitches or tape closures removed in 10 to 14 days, or as directed by your caregiver. °· Do not sleep or lie on the area of the fistula or that arm. This may decrease or stop the blood flow through your fistula. °· Do not allow blood pressures to be taken on this arm. °· Do not allow blood drawing to be done from the graft. °· Do not wear tight clothing around the access site or on the arm. °· Avoid lifting heavy objects with the arm that has the fistula. °· Do not use creams or lotions over the access site. °SEEK MEDICAL CARE IF:  °· You have a fever. °· You have swelling around the fistula that gets worse, or you have new pain. °· You have unusual bleeding at the fistula site or from any other area. °· You have pus or other drainage at the fistula site. °· You have skin  redness or red streaking on the skin around, above, or below the fistula site. °· Your access site feels warm. °· You have any flu-like symptoms. °SEEK IMMEDIATE MEDICAL CARE IF:  °· You have pain, numbness, or an unusual pale skin on the hand or on the side of your fistula. °· You have dizziness or weakness that you have not had before. °· You have shortness of breath. °· You have chest pain. °· Your fistula disconnects or breaks, and there is bleeding that cannot be easily controlled. °Call for local emergency medical help. Do not try to drive yourself to the hospital. °MAKE SURE YOU °· Understand these instructions. °· Will watch your condition. °· Will get help right away if you are not doing well or get worse. °Document Released: 08/15/2005 Document Revised: 11/07/2011 Document Reviewed: 02/02/2011 °[employer]® Patient Information ©2014 [employer]. ° °

## 2014-02-10 NOTE — H&P (View-Only) (Signed)
[facility] POST OPERATIVE OFFICE NOTE  CC:  F/u for surgery  HPI:  This is a 79 y.o. female who is s/p left 2nd stage BVT on 12/17/13.  She is not yet on HD.   She denies any symptoms of steal. She states that she had some swelling in her left arm last week, but this has continued to improve.  Otherwise, no complaints.   Allergies  Allergen Reactions  . Macrobid [Nitrofurantoin Macrocrystal] Anaphylaxis and Rash  . Influenza Vaccines Other (See Comments)    blisters    Current Outpatient Prescriptions  Medication Sig Dispense Refill  . acetaminophen (TYLENOL) 500 MG tablet Take 1,000 mg by mouth 3 (three) times daily as needed for mild pain.       Marland Kitchen albuterol (PROVENTIL HFA;VENTOLIN HFA) 108 (90 BASE) MCG/ACT inhaler Inhale 2 puffs into the lungs every 6 (six) hours as needed for wheezing or shortness of breath.      Marland Kitchen albuterol (PROVENTIL) (2.5 MG/3ML) 0.083% nebulizer solution Take 2.5 mg by nebulization every 6 (six) hours as needed for wheezing or shortness of breath.       Marland Kitchen amLODipine (NORVASC) 5 MG tablet Take 5 mg by mouth daily.      Marland Kitchen aspirin EC 81 MG tablet Take 81 mg by mouth every evening.      . calcitRIOL (ROCALTROL) 0.25 MCG capsule Take 0.25 mcg by mouth 3 (three) times a week. Sunday, Wednesday, and Friday      . clotrimazole-betamethasone (LOTRISONE) cream Apply 1 application topically 2 (two) times daily as needed (for rash).       . furosemide (LASIX) 40 MG tablet Take 40 mg by mouth daily.      Marland Kitchen gabapentin (NEURONTIN) 400 MG capsule Take 400 mg by mouth 3 (three) times daily.      . insulin aspart (NOVOLOG FLEXPEN) 100 UNIT/ML FlexPen Inject 9-22 Units into the skin 3 (three) times daily with meals. Sliding scale      . insulin glargine (LANTUS) 100 UNIT/ML injection Inject 50 Units into the skin at bedtime.       . isosorbide mononitrate (IMDUR) 60 MG 24 hr tablet Take 120 mg by mouth every morning.      Marland Kitchen levothyroxine (SYNTHROID, LEVOTHROID)  75 MCG tablet Take 88 mcg by mouth daily before breakfast.       . metolazone (ZAROXOLYN) 5 MG tablet Take 5 mg by mouth every other day.      . metoprolol succinate (TOPROL-XL) 100 MG 24 hr tablet Take 100 mg by mouth 2 (two) times daily. Take with or immediately following a meal.      . nitroGLYCERIN (NITROSTAT) 0.4 MG SL tablet Place 0.4 mg under the tongue every 5 (five) minutes as needed for chest pain.      . Omega-3 Fatty Acids (FISH OIL) 1000 MG CAPS Take 1,000 mg by mouth 2 (two) times daily.      Marland Kitchen oxyCODONE (ROXICODONE) 5 MG immediate release tablet Take 1 tablet (5 mg total) by mouth every 6 (six) hours as needed for severe pain.  30 tablet  0  . pravastatin (PRAVACHOL) 20 MG tablet Take 10 mg by mouth daily.       . ranolazine (RANEXA) 500 MG 12 hr tablet Take 500 mg by mouth 2 (two) times daily.      . Ticagrelor (BRILINTA) 90 MG TABS tablet Take 1 tablet (90 mg total) by mouth 2 (two) times daily.  60 tablet  No current facility-administered medications for this visit.     ROS:  See HPI  Physical Exam:  Filed Vitals:   01/29/14 1623  BP: 166/64  Pulse: 68    Incision:  Are all healed nicely. Extremities:  2+ left radial pulse.  +thrill and bruit in fistula.  There is some edema in the left arm.  AVF duplex 01/29/14: 1.  Patent left arterial inflow, no hemodynamically significant changes present 2.  Elevated velocities with prominent valve leaflet and diameter changes present suggestive of hemodynamically significant stenosis. 3.  Remainder of the left venous outflow is patent to the level of the axilla, no hemodynamically significant changes are evident.  A/P:  This is a 79 y.o. female here for f/u to her 2nd stage left BVT  -there is some concern with significant stenosis within the fistula -will schedule pt for a fistulogram on 02/10/14. -she is on Brilinta-Spoke with Dr. Scot Dock and she will continue taking this medication. -she has had some swelling in the left  arm, but given that this has improved, doubt she has a central venous stenosis.  Pt advised to elevate arm.  [name], PA-C [facility] [phone]  Clinic MD:  Pt seen and examined with Dr. Scot Dock  Agree with above. I am most concerned about some significantly elevated velocities in the proximal fistula just above the antecubital level. This reason I recommended a fistulogram. We will also try to limit dye given that she is not yet on dialysis. If the stenosis is significant, it could possibly be addressed with venoplasty.  [name], MD, [place] [phone] 01/29/2014

## 2014-02-10 NOTE — Op Note (Signed)
   PATIENT: [name]  MRN: [id number] DOB: 08/03/1935    DATE OF PROCEDURE: 02/10/2014  INDICATIONS: [name] is a 79 y.o. female who had a second stage basilic vein transposition on 12/17/2013. There was an area of markedly increased velocities noted in the proximal fistula on follow up duplex. She presents for a fistulogram.  PROCEDURE:  1. Ultrasound-guided access to left basilic vein transposition 2. fistulogram left basilic vein transposition  SURGEON: Judeth Cornfield. Scot Dock, MD, FACS  ANESTHESIA: local   EBL: minimal  TECHNIQUE: The patient was taking to the peripheral vascular lab in the left upper extremity was prepped and draped in usual sterile fashion. I attempted to cannulate the fistula in a retrograde fashion using a micropuncture needle after the skin was anesthetized. However on 2 occasions the wire would not advance and I suspected it was catching on the valve area of concern. Therefore elected to cannulate the fistula and an antegrade fashion. After the skin was anesthetized the proximal fistula was cannulated with a micropuncture needle and a micropuncture sheath introduced over the wire. Fistulogram was obtained to evaluate the fistula from the cannulation sites the shoulder. Next pressure was held on the distal fistula to allow reflux into the arterial anastomosis. At the completion and 4-0 Monocryl was used to close the cannulation site. There was good hemostasis.  FINDINGS:  1. The fistula is widely patent. The area of concern seen on duplex likely represents a valve site. There is minimal stenosis noted here some mild irregularity noted.  I will see her in follow up in 6 weeks with a duplex scan. If the fistula has not matured adequately at that point she would likely require exploration of the valve site for patch angioplasty.  [name], MD, FACS [facility]  DATE OF DICTATION:   02/10/2014

## 2014-02-13 ENCOUNTER — Telehealth: Payer: Self-pay

## 2014-02-13 ENCOUNTER — Encounter: Payer: Self-pay | Admitting: Family

## 2014-02-13 NOTE — Telephone Encounter (Signed)
Phone call from pt.  Reported a tender, red, circular area at top of left upper arm incision, near the underarm.  Stated she probed it with a q-tip, and able to express small amt. of pus and bloody drainage.  Denied any continued drainage from site.  Stated she thinks it is infected.  Denies fever/ chills.  Encouraged to to cleanse with dial soap and keep area clean / dry.  Appt. given for 02/14/14 @ 9:20 for incision check.  Agrees with plan.

## 2014-02-14 ENCOUNTER — Ambulatory Visit: Payer: Medicare Other | Admitting: Family

## 2014-03-01 ENCOUNTER — Inpatient Hospital Stay (HOSPITAL_COMMUNITY): Payer: Medicare Other

## 2014-03-01 ENCOUNTER — Inpatient Hospital Stay (HOSPITAL_COMMUNITY)
Admission: AD | Admit: 2014-03-01 | Discharge: 2014-03-08 | DRG: 683 | Disposition: A | Discharge status: Home / Self Care | Payer: Medicare Other | Source: Other Acute Inpatient Hospital | Attending: Internal Medicine | Admitting: Internal Medicine

## 2014-03-01 DIAGNOSIS — Z7982 Long term (current) use of aspirin: Secondary | ICD-10-CM | POA: Diagnosis not present

## 2014-03-01 DIAGNOSIS — E1122 Type 2 diabetes mellitus with diabetic chronic kidney disease: Secondary | ICD-10-CM

## 2014-03-01 DIAGNOSIS — E1169 Type 2 diabetes mellitus with other specified complication: Secondary | ICD-10-CM | POA: Diagnosis not present

## 2014-03-01 DIAGNOSIS — I739 Peripheral vascular disease, unspecified: Secondary | ICD-10-CM | POA: Diagnosis present

## 2014-03-01 DIAGNOSIS — Z794 Long term (current) use of insulin: Secondary | ICD-10-CM | POA: Diagnosis not present

## 2014-03-01 DIAGNOSIS — Z6839 Body mass index (BMI) 39.0-39.9, adult: Secondary | ICD-10-CM

## 2014-03-01 DIAGNOSIS — G4730 Sleep apnea, unspecified: Secondary | ICD-10-CM | POA: Diagnosis present

## 2014-03-01 DIAGNOSIS — N186 End stage renal disease: Secondary | ICD-10-CM

## 2014-03-01 DIAGNOSIS — E669 Obesity, unspecified: Secondary | ICD-10-CM | POA: Diagnosis present

## 2014-03-01 DIAGNOSIS — E1165 Type 2 diabetes mellitus with hyperglycemia: Secondary | ICD-10-CM

## 2014-03-01 DIAGNOSIS — Z87891 Personal history of nicotine dependence: Secondary | ICD-10-CM

## 2014-03-01 DIAGNOSIS — R0602 Shortness of breath: Secondary | ICD-10-CM | POA: Diagnosis present

## 2014-03-01 DIAGNOSIS — I10 Essential (primary) hypertension: Secondary | ICD-10-CM

## 2014-03-01 DIAGNOSIS — Z8673 Personal history of transient ischemic attack (TIA), and cerebral infarction without residual deficits: Secondary | ICD-10-CM

## 2014-03-01 DIAGNOSIS — N189 Chronic kidney disease, unspecified: Secondary | ICD-10-CM

## 2014-03-01 DIAGNOSIS — Z79899 Other long term (current) drug therapy: Secondary | ICD-10-CM

## 2014-03-01 DIAGNOSIS — I770 Arteriovenous fistula, acquired: Secondary | ICD-10-CM

## 2014-03-01 DIAGNOSIS — D649 Anemia, unspecified: Secondary | ICD-10-CM | POA: Diagnosis present

## 2014-03-01 DIAGNOSIS — Z905 Acquired absence of kidney: Secondary | ICD-10-CM | POA: Diagnosis not present

## 2014-03-01 DIAGNOSIS — I2510 Atherosclerotic heart disease of native coronary artery without angina pectoris: Secondary | ICD-10-CM | POA: Diagnosis present

## 2014-03-01 DIAGNOSIS — E119 Type 2 diabetes mellitus without complications: Secondary | ICD-10-CM

## 2014-03-01 DIAGNOSIS — E1121 Type 2 diabetes mellitus with diabetic nephropathy: Secondary | ICD-10-CM

## 2014-03-01 DIAGNOSIS — E039 Hypothyroidism, unspecified: Secondary | ICD-10-CM | POA: Diagnosis present

## 2014-03-01 DIAGNOSIS — I120 Hypertensive chronic kidney disease with stage 5 chronic kidney disease or end stage renal disease: Secondary | ICD-10-CM | POA: Diagnosis present

## 2014-03-01 DIAGNOSIS — I509 Heart failure, unspecified: Secondary | ICD-10-CM | POA: Diagnosis present

## 2014-03-01 DIAGNOSIS — M109 Gout, unspecified: Secondary | ICD-10-CM | POA: Diagnosis not present

## 2014-03-01 DIAGNOSIS — N2581 Secondary hyperparathyroidism of renal origin: Secondary | ICD-10-CM | POA: Diagnosis present

## 2014-03-01 DIAGNOSIS — Z9861 Coronary angioplasty status: Secondary | ICD-10-CM | POA: Diagnosis not present

## 2014-03-01 DIAGNOSIS — Z85528 Personal history of other malignant neoplasm of kidney: Secondary | ICD-10-CM

## 2014-03-01 DIAGNOSIS — I252 Old myocardial infarction: Secondary | ICD-10-CM | POA: Diagnosis not present

## 2014-03-01 DIAGNOSIS — N179 Acute kidney failure, unspecified: Principal | ICD-10-CM | POA: Diagnosis present

## 2014-03-01 LAB — CBC
HCT: 30.6 % — ABNORMAL LOW (ref 36.0–46.0)
Hemoglobin: 9.7 g/dL — ABNORMAL LOW (ref 12.0–15.0)
MCH: 27.6 pg (ref 26.0–34.0)
MCHC: 31.7 g/dL (ref 30.0–36.0)
MCV: 87.2 fL (ref 78.0–100.0)
Platelets: 216 10*3/uL (ref 150–400)
RBC: 3.51 MIL/uL — ABNORMAL LOW (ref 3.87–5.11)
RDW: 15.7 % — AB (ref 11.5–15.5)
WBC: 9 10*3/uL (ref 4.0–10.5)

## 2014-03-01 LAB — CREATININE, SERUM
CREATININE: 4.74 mg/dL — AB (ref 0.50–1.10)
GFR calc Af Amer: 9 mL/min — ABNORMAL LOW (ref >90)
GFR calc non Af Amer: 8 mL/min — ABNORMAL LOW (ref >90)

## 2014-03-01 LAB — MRSA PCR SCREENING: MRSA BY PCR: NEGATIVE

## 2014-03-01 LAB — GLUCOSE, CAPILLARY
Glucose-Capillary: 141 mg/dL — ABNORMAL HIGH (ref 70–99)
Glucose-Capillary: 124 mg/dL — ABNORMAL HIGH (ref 70–99)

## 2014-03-01 MED ORDER — LEVOTHYROXINE SODIUM 88 MCG PO TABS
88.0000 ug | ORAL_TABLET | Freq: Every day | ORAL | Status: DC
  Administered 2014-03-02 – 2014-03-08 (×6): 88 ug via ORAL
  Filled 2014-03-01 (×8): qty 1

## 2014-03-01 MED ORDER — FUROSEMIDE 10 MG/ML IJ SOLN
160.0000 mg | Freq: Four times a day (QID) | INTRAVENOUS | Status: DC
  Administered 2014-03-01 – 2014-03-08 (×22): 160 mg via INTRAVENOUS
  Filled 2014-03-01 (×29): qty 16

## 2014-03-01 MED ORDER — CALCITRIOL 0.25 MCG PO CAPS
0.2500 ug | ORAL_CAPSULE | ORAL | Status: DC
  Administered 2014-03-05 – 2014-03-07 (×2): 0.25 ug via ORAL
  Filled 2014-03-01 (×5): qty 1

## 2014-03-01 MED ORDER — ALBUTEROL SULFATE (2.5 MG/3ML) 0.083% IN NEBU
2.5000 mg | INHALATION_SOLUTION | Freq: Four times a day (QID) | RESPIRATORY_TRACT | Status: DC | PRN
Start: 2014-03-01 — End: 2014-03-08

## 2014-03-01 MED ORDER — METOPROLOL TARTRATE 50 MG PO TABS
50.0000 mg | ORAL_TABLET | Freq: Two times a day (BID) | ORAL | Status: DC
  Administered 2014-03-01 – 2014-03-08 (×13): 50 mg via ORAL
  Filled 2014-03-01 (×15): qty 1

## 2014-03-01 MED ORDER — ALBUTEROL SULFATE HFA 108 (90 BASE) MCG/ACT IN AERS: 2.0000 | INHALATION_SPRAY | Freq: Four times a day (QID) | RESPIRATORY_TRACT | Status: DC | PRN

## 2014-03-01 MED ORDER — FUROSEMIDE 10 MG/ML IJ SOLN
10.0000 mg/h | INTRAVENOUS | Status: DC
  Filled 2014-03-01: qty 25

## 2014-03-01 MED ORDER — ASPIRIN EC 81 MG PO TBEC
81.0000 mg | DELAYED_RELEASE_TABLET | Freq: Every evening | ORAL | Status: DC
Start: 2014-03-01 — End: 2014-03-01

## 2014-03-01 MED ORDER — INSULIN ASPART 100 UNIT/ML ~~LOC~~ SOLN: 0.0000 [IU] | Freq: Every day | SUBCUTANEOUS | Status: DC

## 2014-03-01 MED ORDER — GABAPENTIN 400 MG PO CAPS
400.0000 mg | ORAL_CAPSULE | Freq: Three times a day (TID) | ORAL | Status: DC
  Administered 2014-03-01: 400 mg via ORAL
  Filled 2014-03-01 (×5): qty 1

## 2014-03-01 MED ORDER — ASPIRIN EC 81 MG PO TBEC
81.0000 mg | DELAYED_RELEASE_TABLET | Freq: Every evening | ORAL | Status: DC
  Administered 2014-03-02 – 2014-03-07 (×5): 81 mg via ORAL
  Filled 2014-03-01 (×8): qty 1

## 2014-03-01 MED ORDER — ONDANSETRON HCL 4 MG/2ML IJ SOLN
4.0000 mg | Freq: Four times a day (QID) | INTRAMUSCULAR | Status: DC | PRN
  Administered 2014-03-02: 4 mg via INTRAVENOUS
  Filled 2014-03-01: qty 2

## 2014-03-01 MED ORDER — HEPARIN SODIUM (PORCINE) 5000 UNIT/ML IJ SOLN
5000.0000 [IU] | Freq: Three times a day (TID) | INTRAMUSCULAR | Status: DC
  Administered 2014-03-01 – 2014-03-07 (×12): 5000 [IU] via SUBCUTANEOUS
  Filled 2014-03-01 (×24): qty 1

## 2014-03-01 MED ORDER — CALCITRIOL 0.25 MCG PO CAPS: 0.2500 ug | ORAL_CAPSULE | ORAL | Status: DC

## 2014-03-01 MED ORDER — TICAGRELOR 90 MG PO TABS
90.0000 mg | ORAL_TABLET | Freq: Two times a day (BID) | ORAL | Status: DC
  Administered 2014-03-01 – 2014-03-02 (×2): 90 mg via ORAL
  Filled 2014-03-01 (×3): qty 1

## 2014-03-01 MED ORDER — ISOSORBIDE MONONITRATE ER 60 MG PO TB24
120.0000 mg | ORAL_TABLET | Freq: Every day | ORAL | Status: DC
  Administered 2014-03-02 – 2014-03-08 (×6): 120 mg via ORAL
  Filled 2014-03-01 (×7): qty 2

## 2014-03-01 MED ORDER — ACETAMINOPHEN 650 MG RE SUPP: 650.0000 mg | Freq: Four times a day (QID) | RECTAL | Status: DC | PRN

## 2014-03-01 MED ORDER — NYSTATIN 100000 UNIT/GM EX POWD
1.0000 g | Freq: Three times a day (TID) | CUTANEOUS | Status: DC
  Administered 2014-03-01 – 2014-03-08 (×14): 1 g via TOPICAL
  Filled 2014-03-01 (×2): qty 15

## 2014-03-01 MED ORDER — RANOLAZINE ER 500 MG PO TB12
500.0000 mg | ORAL_TABLET | Freq: Two times a day (BID) | ORAL | Status: DC
  Administered 2014-03-01 – 2014-03-08 (×13): 500 mg via ORAL
  Filled 2014-03-01 (×15): qty 1

## 2014-03-01 MED ORDER — ONDANSETRON HCL 4 MG PO TABS: 4.0000 mg | ORAL_TABLET | Freq: Four times a day (QID) | ORAL | Status: DC | PRN

## 2014-03-01 MED ORDER — ACETAMINOPHEN 325 MG PO TABS
650.0000 mg | ORAL_TABLET | Freq: Four times a day (QID) | ORAL | Status: DC | PRN
  Administered 2014-03-03 – 2014-03-06 (×6): 650 mg via ORAL
  Filled 2014-03-01 (×6): qty 2

## 2014-03-01 MED ORDER — METOPROLOL TARTRATE 100 MG PO TABS
100.0000 mg | ORAL_TABLET | Freq: Two times a day (BID) | ORAL | Status: DC
  Filled 2014-03-01: qty 1

## 2014-03-01 MED ORDER — INSULIN GLARGINE 100 UNIT/ML ~~LOC~~ SOLN
50.0000 [IU] | Freq: Every day | SUBCUTANEOUS | Status: DC
  Administered 2014-03-01: 50 [IU] via SUBCUTANEOUS
  Filled 2014-03-01: qty 0.5

## 2014-03-01 MED ORDER — AMLODIPINE BESYLATE 5 MG PO TABS: 5.0000 mg | ORAL_TABLET | Freq: Every day | ORAL | Status: DC

## 2014-03-01 MED ORDER — ISOSORBIDE MONONITRATE ER 60 MG PO TB24: 120.0000 mg | ORAL_TABLET | Freq: Every day | ORAL | Status: DC

## 2014-03-01 MED ORDER — INSULIN ASPART 100 UNIT/ML ~~LOC~~ SOLN: 0.0000 [IU] | Freq: Three times a day (TID) | SUBCUTANEOUS | Status: DC

## 2014-03-01 MED ORDER — ALUM & MAG HYDROXIDE-SIMETH 200-200-20 MG/5ML PO SUSP: 30.0000 mL | Freq: Four times a day (QID) | ORAL | Status: DC | PRN

## 2014-03-01 NOTE — H&P (Addendum)
[facility] History and Physical  [name] CVE:[id number] DOB: 07-Mar-1935 DOA: 03/01/2014  PCP: Rochel Brome, MD  Specialists: [facility], Dr Tamala Julian (endocrine)  Chief Complaint: sent from [facility]  HPI: [name] is a 79 y.o. female with a past medical history of CKD status post fistula placement and left arm, hypertension, coronary artery disease with stent, peripheral vascular disease. The patient presented to [facility] 2 days ago with complaints of shortness of breath that had been going on for the past 7 days. She was severely short of breath with even taking a few steps and had noted a 10 pound weight gain and inability to lay flat, having to sleep in a recliner. She was found to be in moderate respiratory distress and admitted to the step down unit. She was started on IV Lasix for fluid overload. Since Lasix has been ineffective in diuresing her, she was sent to [facility] to receive dialysis   General: The patient denies anorexia, fever, weight loss Cardiac: Denies chest pain, syncope, palpitations + pedal edema  Respiratory: Denies dyspnea on exertion, cough, shortness of breath, wheezing  GI: Denies severe indigestion/heartburn, abdominal pain, nausea, vomiting, diarrhea and constipation GU: Denies hematuria,  dysuria  Musculoskeletal: Denies muscle pain or arthiritis Skin: rash under breast and in groin- uses Nystatin powder Neurologic: Denies focal weakness or numbness, change in vision  Past Medical History  Diagnosis Date  . Hypertension   . Heart failure December 07, 2012    Right side  . Hypercalcemia     2nd to hyperparathyroidism  . Thyroid disease     Hyperparathyroidism  . Thyroid disease     Hypothyroidism  . Dyslipidemia   . CHF (congestive heart failure)   . Family history of anesthesia complication     sister - N/V  . Myocardial infarction 10/28/2010  . Peripheral vascular disease   . Short-term memory loss   .  Stroke 2010ish    no residual  . Shingles 2010 ish  . Anginal pain   . Coronary artery disease   . ESRD (end stage renal disease)     Renal insufficiency NOT ON HD  . Diabetes mellitus without complication     TYPE 2  . Shortness of breath   . Parathyroid cyst   . Headache(784.0)     CALCIFICATION LT SKULL  . Arthritis     Gout  . Cancer     kidney left removed  . Sleep apnea     Recently test in [place](NEG) NO CPAP    Past Surgical History  Procedure Laterality Date  . Nephrectomy Left 2005  . Abdominal hysterectomy  2005  . Cholecystectomy  2005  . Cornary stent    . Coronary stent placement Left 10/2010  . Av fistula placement Left 01/09/2013    Procedure: ARTERIOVENOUS (AV) FISTULA CREATION;  Surgeon: Mal Misty, MD;  Location: [place];  Service: Vascular;  Laterality: Left;  Creation of Left Brachial Cephalic Arteriovenous Fistula  . Bascilic vein transposition Left 07/30/2013    Procedure: 1ST Stage BASCILIC VEIN TRANSPOSITION;  Surgeon: Angelia Mould, MD;  Location: [place];  Service: Vascular;  Laterality: Left;  . Cardiac catheterization      2013 STENT PLACED  . Cataract extraction      BILAT  . Bascilic vein transposition Left 12/17/2013    Procedure: BASILIC VEIN TRANSPOSITION - LEFT ARM 2ND STAGE;  Surgeon: Angelia Mould, MD;  Location: [place];  Service:  Vascular;  Laterality: Left;   Social History:  reports that she quit smoking about 16 years ago. Her smoking use included Cigarettes. She smoked 0.00 packs per day for 46 years. She has never used smokeless tobacco. She reports that she does not drink alcohol or use illicit drugs. Lives at home alone  Manages ADLs  Allergies  Allergen Reactions  . Macrobid [Nitrofurantoin Macrocrystal] Anaphylaxis and Rash  . Ace Inhibitors Other (See Comments)    hyperkalemia  . Angiotensin Receptor Blockers Other (See Comments)    hyperkalemia  . Influenza Vaccines Itching, Rash and Other (See Comments)     blisters    Family History  Problem Relation Age of Onset  . Diabetes Sister   . Heart disease Sister     before age 45  . Hyperlipidemia Sister   . Hypertension Sister   . Diabetes Sister   . Hyperlipidemia Sister   . Hypertension Sister       Prior to Admission medications   Medication Sig Start Date End Date Taking? Authorizing Provider  acetaminophen (TYLENOL) 500 MG tablet Take 1,000 mg by mouth 3 (three) times daily as needed for mild pain.    Yes Historical Provider, MD  albuterol (PROVENTIL HFA;VENTOLIN HFA) 108 (90 BASE) MCG/ACT inhaler Inhale 2 puffs into the lungs every 6 (six) hours as needed for wheezing or shortness of breath.   Yes Historical Provider, MD  albuterol (PROVENTIL) (2.5 MG/3ML) 0.083% nebulizer solution Take 2.5 mg by nebulization every 6 (six) hours as needed for wheezing or shortness of breath.    Yes Historical Provider, MD  amLODipine (NORVASC) 5 MG tablet Take 5 mg by mouth daily.   Yes Historical Provider, MD  aspirin EC 81 MG tablet Take 81 mg by mouth every evening.   Yes Historical Provider, MD  calcitRIOL (ROCALTROL) 0.25 MCG capsule Take 0.25 mcg by mouth 3 (three) times a week. Sunday, Wednesday, and Friday   Yes Historical Provider, MD  furosemide (LASIX) 40 MG tablet Take 40 mg by mouth daily.   Yes Historical Provider, MD  gabapentin (NEURONTIN) 400 MG capsule Take 400 mg by mouth 3 (three) times daily.   Yes Historical Provider, MD  insulin aspart (NOVOLOG FLEXPEN) 100 UNIT/ML FlexPen Inject 9-22 Units into the skin 3 (three) times daily with meals. Sliding scale   Yes Historical Provider, MD  insulin glargine (LANTUS) 100 UNIT/ML injection Inject 50 Units into the skin at bedtime.    Yes Historical Provider, MD  isosorbide mononitrate (IMDUR) 60 MG 24 hr tablet Take 120 mg by mouth daily.    Yes Historical Provider, MD  metolazone (ZAROXOLYN) 5 MG tablet Take 5 mg by mouth every other day.   Yes Historical Provider, MD  metoprolol  (LOPRESSOR) 100 MG tablet Take 100 mg by mouth 2 (two) times daily.   Yes Historical Provider, MD  nitroGLYCERIN (NITROSTAT) 0.4 MG SL tablet Place 0.4 mg under the tongue every 5 (five) minutes as needed for chest pain.   Yes Historical Provider, MD  nystatin (MYCOSTATIN/NYSTOP) 100000 UNIT/GM POWD Apply 1 g topically every morning.   Yes Historical Provider, MD  Omega-3 Fatty Acids (FISH OIL) 1000 MG CAPS Take 1,000 mg by mouth 2 (two) times daily.   Yes Historical Provider, MD  oxyCODONE (ROXICODONE) 5 MG immediate release tablet Take 1 tablet (5 mg total) by mouth every 6 (six) hours as needed for severe pain. 12/17/13  Yes Samantha J Rhyne, PA-C  oxyCODONE-acetaminophen (PERCOCET/ROXICET) 5-325 MG per tablet Take  1 tablet by mouth every 4 (four) hours as needed for moderate pain or severe pain.   Yes Historical Provider, MD  pravastatin (PRAVACHOL) 20 MG tablet Take 10 mg by mouth daily.    Yes Historical Provider, MD  ranolazine (RANEXA) 500 MG 12 hr tablet Take 500 mg by mouth 2 (two) times daily.   Yes Historical Provider, MD  Ticagrelor (BRILINTA) 90 MG TABS tablet Take 1 tablet (90 mg total) by mouth 2 (two) times daily. 01/10/13  Yes Richrd Prime, PA-C     Physical Exam: Vitals Filed Vitals:   03/01/14 1639  BP: 120/46  Pulse: 56  Temp: 97.8 F (36.6 C)  TempSrc: Oral  Resp: 20  SpO2: 100%      General: AAO x 3, no distress HEENT: Normocephalic and Atraumatic, Mucous membranes pink- dry mucosa                PERRLA; EOM intact; No scleral icterus,                 Nares: Patent, Oropharynx: Clear, Fair Dentition                 Neck: FROM, no cervical lymphadenopathy, thyromegaly, carotid bruit or JVD;  Breasts: deferred CHEST WALL: No tenderness  CHEST: Normal respiration, clear to auscultation bilaterally  HEART: Regular rate and rhythm; no murmurs rubs or gallops  BACK: No kyphosis or scoliosis; no CVA tenderness  ABDOMEN: Positive Bowel Sounds, soft, non-tender;  no masses, no organomegaly + dullness to precussion Rectal Exam: deferred EXTREMITIES: No cyanosis, clubbing, + edema- no pitting Genitalia: not examined  CNS: Alert and Oriented x 4, Nonfocal exam, CN 2-12 intact  Labs on Admission:  Basic Metabolic Panel: No results found for this basename: NA, K, CL, CO2, GLUCOSE, BUN, CREATININE, CALCIUM, MG, PHOS,  in the last 168 hours Liver Function Tests: No results found for this basename: AST, ALT, ALKPHOS, BILITOT, PROT, ALBUMIN,  in the last 168 hours No results found for this basename: LIPASE, AMYLASE,  in the last 168 hours No results found for this basename: AMMONIA,  in the last 168 hours CBC: No results found for this basename: WBC, NEUTROABS, HGB, HCT, MCV, PLT,  in the last 168 hours Cardiac Enzymes: No results found for this basename: CKTOTAL, CKMB, CKMBINDEX, TROPONINI,  in the last 168 hours  BNP (last 3 results) No results found for this basename: PROBNP,  in the last 8760 hours CBG:  Recent Labs Lab 03/01/14 1638  GLUCAP 141*    Radiological Exams on Admission: No results found.    Assessment/Plan Principal Problem:   Renal failure (ARF), acute on chronic - management per renal team- she is currently on a Lasix infusion which I will continue  Active Problems:   DM type 2 (diabetes mellitus, type 2) -Resume home dose of Lantus and place on a sliding scale which is what she uses at home -Diabetic diet    HTN (hypertension) -Continue amlodipine,and metoprolol    CAD in native artery -Continue Imdur,Ranexa, aspirin and Brilinta    Unspecified hypothyroidism -Continue Synthroid    Consulted: renal  Code Status: full code Family Communication: son   Time spent: >45 min  [place], MD [facility]  If 7PM-7AM, please contact night-coverage [email or web address] 03/01/2014, 5:45 PM

## 2014-03-01 NOTE — Consult Note (Addendum)
Renal Service Consult Note [place] 03/01/2014 Roney Jaffe D Requesting Physician:  Dr. Wynelle Cleveland  Reason for Consult:  CKD patient  HPI: The patient is a 79 y.o. year-old with hx of CKD, HTN, CAD w stent and PVD.  She was admitted at [facility] 2 days ago with DOE, weight gain and orthopnea.  She was admitted to a SDU bed there and rec'd IV lasix.  Reportedly she did not diurese well and so was transferred here for renal evaluation.  She has an AVF.   She says breathing is not a lot better.  Lying flat without difficulty, coughing off and on, nonproductive. No fevers. Wt gain of about 20 lbs, leg and arm swelling. Compliant with meds , on Lasix 40 mg/day and Zaroxlyn 5 mg/day at home.   Chart review: Oct 2011-- AKI on CKD, drug rash, HTN, DM2, severe HA.  Pt presented with rash on Macrodantin and HA's.  Hx of solitary kidney and nephrectomy in the past for RCC. Sept 2005-- presented with CP and SOB for 2 days, severe fatigue. +CE's and EKG showed T-wave changes in AL and IL leads, severe LV dysfunction by ECHO. Severe WMA not explained on heart cath which showed no sig LAD disease. Recath showed improving WMA.  Possible Takosubo syndrome due to emotional stress. Medical Rx. Creat was 1.01 Jan 2013-- L [facility] AVF Dec 2014-- 1st stage L BVT (failed L BC AVF) Apr 2015-- 2nd stage L BVT Jun 2015-- fistulogram done for abnormal acceleration of flow seen on [place]; no stenosis by f-gram, likely was a valve site. Plan f/u 6 wks for duplex scan , if not maturing may require exploration per Dr Scot Dock   ROS  No cp, abd pain, n/v/d  No jt pain except L ankle  No HA or blurry vision  No NSAID"s  No paralysis or loss of sensation  Past Medical History  Past Medical History  Diagnosis Date  . Hypertension   . Heart failure December 07, 2012    Right side  . Hypercalcemia     2nd to hyperparathyroidism  . Thyroid disease     Hyperparathyroidism  . Thyroid  disease     Hypothyroidism  . Dyslipidemia   . CHF (congestive heart failure)   . Family history of anesthesia complication     sister - N/V  . Myocardial infarction 10/28/2010  . Peripheral vascular disease   . Short-term memory loss   . Stroke 2010ish    no residual  . Shingles 2010 ish  . Anginal pain   . Coronary artery disease   . ESRD (end stage renal disease)     Renal insufficiency NOT ON HD  . Diabetes mellitus without complication     TYPE 2  . Shortness of breath   . Parathyroid cyst   . Headache(784.0)     CALCIFICATION LT SKULL  . Arthritis     Gout  . Cancer     kidney left removed  . Sleep apnea     Recently test in [place](NEG) NO CPAP    Past Surgical History  Past Surgical History  Procedure Laterality Date  . Nephrectomy Left 2005  . Abdominal hysterectomy  2005  . Cholecystectomy  2005  . Cornary stent    . Coronary stent placement Left 10/2010  . Av fistula placement Left 01/09/2013    Procedure: ARTERIOVENOUS (AV) FISTULA CREATION;  Surgeon: Mal Misty, MD;  Location: [place];  Service: Vascular;  Laterality: Left;  Creation of Left Brachial Cephalic Arteriovenous Fistula  . Bascilic vein transposition Left 07/30/2013    Procedure: 1ST Stage BASCILIC VEIN TRANSPOSITION;  Surgeon: Angelia Mould, MD;  Location: [place];  Service: Vascular;  Laterality: Left;  . Cardiac catheterization      2013 STENT PLACED  . Cataract extraction      BILAT  . Bascilic vein transposition Left 12/17/2013    Procedure: BASILIC VEIN TRANSPOSITION - LEFT ARM 2ND STAGE;  Surgeon: Angelia Mould, MD;  Location: [facility];  Service: Vascular;  Laterality: Left;   Family History  Family History  Problem Relation Age of Onset  . Diabetes Sister   . Heart disease Sister     before age 32  . Hyperlipidemia Sister   . Hypertension Sister   . Diabetes Sister   . Hyperlipidemia Sister   . Hypertension Sister    Social History  reports that she quit smoking  about 16 years ago. Her smoking use included Cigarettes. She smoked 0.00 packs per day for 46 years. She has never used smokeless tobacco. She reports that she does not drink alcohol or use illicit drugs. Allergies  Allergies  Allergen Reactions  . Macrobid [Nitrofurantoin Macrocrystal] Anaphylaxis and Rash  . Ace Inhibitors Other (See Comments)    hyperkalemia  . Angiotensin Receptor Blockers Other (See Comments)    hyperkalemia  . Influenza Vaccines Itching, Rash and Other (See Comments)    blisters   Home medications Prior to Admission medications   Medication Sig Start Date End Date Taking? Authorizing Provider  acetaminophen (TYLENOL) 500 MG tablet Take 1,000 mg by mouth 3 (three) times daily as needed for mild pain.    Yes Historical Provider, MD  albuterol (PROVENTIL HFA;VENTOLIN HFA) 108 (90 BASE) MCG/ACT inhaler Inhale 2 puffs into the lungs every 6 (six) hours as needed for wheezing or shortness of breath.   Yes Historical Provider, MD  albuterol (PROVENTIL) (2.5 MG/3ML) 0.083% nebulizer solution Take 2.5 mg by nebulization every 6 (six) hours as needed for wheezing or shortness of breath.    Yes Historical Provider, MD  amLODipine (NORVASC) 5 MG tablet Take 5 mg by mouth daily.   Yes Historical Provider, MD  aspirin EC 81 MG tablet Take 81 mg by mouth every evening.   Yes Historical Provider, MD  calcitRIOL (ROCALTROL) 0.25 MCG capsule Take 0.25 mcg by mouth 3 (three) times a week. Sunday, Wednesday, and Friday   Yes Historical Provider, MD  furosemide (LASIX) 40 MG tablet Take 40 mg by mouth daily.   Yes Historical Provider, MD  gabapentin (NEURONTIN) 400 MG capsule Take 400 mg by mouth 3 (three) times daily.   Yes Historical Provider, MD  insulin aspart (NOVOLOG FLEXPEN) 100 UNIT/ML FlexPen Inject 9-22 Units into the skin 3 (three) times daily with meals. Sliding scale   Yes Historical Provider, MD  insulin glargine (LANTUS) 100 UNIT/ML injection Inject 50 Units into the skin at  bedtime.    Yes Historical Provider, MD  isosorbide mononitrate (IMDUR) 60 MG 24 hr tablet Take 120 mg by mouth daily.    Yes Historical Provider, MD  levothyroxine (SYNTHROID, LEVOTHROID) 88 MCG tablet Take 88 mcg by mouth daily before breakfast.   Yes Historical Provider, MD  metolazone (ZAROXOLYN) 5 MG tablet Take 5 mg by mouth every other day.   Yes Historical Provider, MD  metoprolol (LOPRESSOR) 100 MG tablet Take 100 mg by mouth 2 (two) times daily.   Yes Historical Provider,  MD  nitroGLYCERIN (NITROSTAT) 0.4 MG SL tablet Place 0.4 mg under the tongue every 5 (five) minutes as needed for chest pain.   Yes Historical Provider, MD  nystatin (MYCOSTATIN/NYSTOP) 100000 UNIT/GM POWD Apply 1 g topically every morning.   Yes Historical Provider, MD  Omega-3 Fatty Acids (FISH OIL) 1000 MG CAPS Take 1,000 mg by mouth 2 (two) times daily.   Yes Historical Provider, MD  oxyCODONE (ROXICODONE) 5 MG immediate release tablet Take 1 tablet (5 mg total) by mouth every 6 (six) hours as needed for severe pain. 12/17/13  Yes Samantha J Rhyne, PA-C  oxyCODONE-acetaminophen (PERCOCET/ROXICET) 5-325 MG per tablet Take 1 tablet by mouth every 4 (four) hours as needed for moderate pain or severe pain.   Yes Historical Provider, MD  pravastatin (PRAVACHOL) 20 MG tablet Take 10 mg by mouth daily.    Yes Historical Provider, MD  ranolazine (RANEXA) 500 MG 12 hr tablet Take 500 mg by mouth 2 (two) times daily.   Yes Historical Provider, MD  Ticagrelor (BRILINTA) 90 MG TABS tablet Take 1 tablet (90 mg total) by mouth 2 (two) times daily. 01/10/13  Yes Nancy Nordmann Roczniak, PA-C   Liver Function Tests No results found for this basename: AST, ALT, ALKPHOS, BILITOT, PROT, ALBUMIN,  in the last 168 hours No results found for this basename: LIPASE, AMYLASE,  in the last 168 hours CBC  Recent Labs Lab 03/01/14 1846  WBC 9.0  HGB 9.7*  HCT 30.6*  MCV 87.2  PLT 768   Basic Metabolic Panel  Recent Labs Lab 03/01/14 1846   CREATININE 4.74*    Filed Vitals:   03/01/14 1639  BP: 120/46  Pulse: 56  Temp: 97.8 F (36.6 C)  TempSrc: Oral  Resp: 20  SpO2: 100%   Exam Obese pleasant WF no distress, anxious No rash, cyanosis or gangrene Sclera anicteric, throat clear +JVD Chest rales on L 1/2 up, R clear RRR no MRG Abd obese, soft, NTND GU foley in place with yellowish clear urine 2+ pitting bilat LE edema, +bilat UE edema L>R Neuro is alert, nf, Ox 3 LUA AVF patent  Assessment: 1 CKD stage V w volume excess and dyspnea; no uremic symptoms 2 Hx L nephrectomy 2010 for RCC (tumor was found at time of hysterectomy) 3 HTN 4 Hx of CHF 5 CAD w stent 6 DM2 x 15 years on insulin 7 Hx CVA 2010 (speech, L arm > resolved) 8 Hypothyroid 9 Anemia Hb 9.7 10 Access - w LUA BVT, 2nd stage in April and fistulogram in June; f/b VVS   Plan- Diurese with max dose IV lasix, decrease BP meds, follow clinically.  Not grossly uremic and does not require acute HD.   Kelly Splinter MD (pgr) [phone]    ([phone] 03/01/2014, 8:41 PM

## 2014-03-02 ENCOUNTER — Other Ambulatory Visit (HOSPITAL_COMMUNITY): Payer: Medicare Other

## 2014-03-02 ENCOUNTER — Encounter (HOSPITAL_COMMUNITY): Payer: Self-pay | Admitting: *Deleted

## 2014-03-02 ENCOUNTER — Inpatient Hospital Stay (HOSPITAL_COMMUNITY): Payer: Medicare Other

## 2014-03-02 DIAGNOSIS — N186 End stage renal disease: Secondary | ICD-10-CM

## 2014-03-02 DIAGNOSIS — E1129 Type 2 diabetes mellitus with other diabetic kidney complication: Secondary | ICD-10-CM

## 2014-03-02 LAB — CBC
HCT: 32.3 % — ABNORMAL LOW (ref 36.0–46.0)
Hemoglobin: 10.1 g/dL — ABNORMAL LOW (ref 12.0–15.0)
MCH: 27.2 pg (ref 26.0–34.0)
MCHC: 31.3 g/dL (ref 30.0–36.0)
MCV: 87.1 fL (ref 78.0–100.0)
PLATELETS: 245 10*3/uL (ref 150–400)
RBC: 3.71 MIL/uL — ABNORMAL LOW (ref 3.87–5.11)
RDW: 15.6 % — AB (ref 11.5–15.5)
WBC: 9.3 10*3/uL (ref 4.0–10.5)

## 2014-03-02 LAB — GLUCOSE, CAPILLARY
GLUCOSE-CAPILLARY: 191 mg/dL — AB (ref 70–99)
GLUCOSE-CAPILLARY: 183 mg/dL — AB (ref 70–99)
Glucose-Capillary: 48 mg/dL — ABNORMAL LOW (ref 70–99)
Glucose-Capillary: 64 mg/dL — ABNORMAL LOW (ref 70–99)
Glucose-Capillary: 114 mg/dL — ABNORMAL HIGH (ref 70–99)
Glucose-Capillary: 63 mg/dL — ABNORMAL LOW (ref 70–99)
Glucose-Capillary: 117 mg/dL — ABNORMAL HIGH (ref 70–99)
Glucose-Capillary: 122 mg/dL — ABNORMAL HIGH (ref 70–99)

## 2014-03-02 LAB — RENAL FUNCTION PANEL
Albumin: 2.8 g/dL — ABNORMAL LOW (ref 3.5–5.2)
Anion gap: 19 — ABNORMAL HIGH (ref 5–15)
BUN: 98 mg/dL — ABNORMAL HIGH (ref 6–23)
CO2: 20 meq/L (ref 19–32)
Calcium: 8.9 mg/dL (ref 8.4–10.5)
Chloride: 96 meq/L (ref 96–112)
Creatinine, Ser: 4.79 mg/dL — ABNORMAL HIGH (ref 0.50–1.10)
GFR calc Af Amer: 9 mL/min — ABNORMAL LOW (ref >90)
GFR calc non Af Amer: 8 mL/min — ABNORMAL LOW (ref >90)
Glucose, Bld: 163 mg/dL — ABNORMAL HIGH (ref 70–99)
Phosphorus: 7.5 mg/dL — ABNORMAL HIGH (ref 2.3–4.6)
Potassium: 4.7 meq/L (ref 3.7–5.3)
Sodium: 135 meq/L — ABNORMAL LOW (ref 137–147)

## 2014-03-02 LAB — BASIC METABOLIC PANEL
Anion gap: 20 — ABNORMAL HIGH (ref 5–15)
BUN: 98 mg/dL — AB (ref 6–23)
CO2: 20 mEq/L (ref 19–32)
Calcium: 9.1 mg/dL (ref 8.4–10.5)
Chloride: 99 mEq/L (ref 96–112)
Creatinine, Ser: 4.74 mg/dL — ABNORMAL HIGH (ref 0.50–1.10)
GFR calc Af Amer: 9 mL/min — ABNORMAL LOW (ref >90)
GFR, EST NON AFRICAN AMERICAN: 8 mL/min — AB (ref >90)
GLUCOSE: 48 mg/dL — AB (ref 70–99)
Potassium: 4.1 mEq/L (ref 3.7–5.3)
Sodium: 139 mEq/L (ref 137–147)

## 2014-03-02 LAB — HEPATITIS B SURFACE ANTIGEN: Hepatitis B Surface Ag: NEGATIVE

## 2014-03-02 MED ORDER — HEPARIN SODIUM (PORCINE) 1000 UNIT/ML DIALYSIS: 20.0000 [IU]/kg | Freq: Once | INTRAMUSCULAR | Status: DC

## 2014-03-02 MED ORDER — SODIUM CHLORIDE 0.9 % IV SOLN: 100.0000 mL | INTRAVENOUS | Status: DC | PRN

## 2014-03-02 MED ORDER — DEXTROSE 50 % IV SOLN
INTRAVENOUS | Status: AC
Start: 2014-03-02 — End: 2014-03-02
  Administered 2014-03-02: 25 mL via INTRAVENOUS
  Filled 2014-03-02: qty 50

## 2014-03-02 MED ORDER — NEPRO/CARBSTEADY PO LIQD: 237.0000 mL | ORAL | Status: DC | PRN

## 2014-03-02 MED ORDER — INSULIN GLARGINE 100 UNIT/ML ~~LOC~~ SOLN
35.0000 [IU] | Freq: Every day | SUBCUTANEOUS | Status: DC
  Filled 2014-03-02: qty 0.35

## 2014-03-02 MED ORDER — PENTAFLUOROPROP-TETRAFLUOROETH EX AERO: 1.0000 "application " | INHALATION_SPRAY | CUTANEOUS | Status: DC | PRN

## 2014-03-02 MED ORDER — LIDOCAINE HCL (PF) 1 % IJ SOLN: 5.0000 mL | INTRAMUSCULAR | Status: DC | PRN

## 2014-03-02 MED ORDER — ALTEPLASE 2 MG IJ SOLR: 2.0000 mg | Freq: Once | INTRAMUSCULAR | Status: AC | PRN

## 2014-03-02 MED ORDER — LIDOCAINE-PRILOCAINE 2.5-2.5 % EX CREA: 1.0000 "application " | TOPICAL_CREAM | CUTANEOUS | Status: DC | PRN

## 2014-03-02 MED ORDER — METOLAZONE 10 MG PO TABS
10.0000 mg | ORAL_TABLET | Freq: Every day | ORAL | Status: DC
  Administered 2014-03-02 – 2014-03-08 (×6): 10 mg via ORAL
  Filled 2014-03-02 (×7): qty 1

## 2014-03-02 MED ORDER — DEXTROSE 50 % IV SOLN
25.0000 mL | Freq: Once | INTRAVENOUS | Status: AC | PRN
  Administered 2014-03-02: 25 mL via INTRAVENOUS
  Filled 2014-03-02: qty 50

## 2014-03-02 MED ORDER — GABAPENTIN 100 MG PO CAPS
200.0000 mg | ORAL_CAPSULE | Freq: Every day | ORAL | Status: DC
  Administered 2014-03-02 – 2014-03-08 (×6): 200 mg via ORAL
  Filled 2014-03-02 (×7): qty 2

## 2014-03-02 MED ORDER — INSULIN GLARGINE 100 UNIT/ML ~~LOC~~ SOLN
25.0000 [IU] | Freq: Every day | SUBCUTANEOUS | Status: DC
  Administered 2014-03-02 – 2014-03-03 (×2): 25 [IU] via SUBCUTANEOUS
  Filled 2014-03-02 (×2): qty 0.25

## 2014-03-02 MED ORDER — ALTEPLASE 2 MG IJ SOLR: 2.0000 mg | Freq: Once | INTRAMUSCULAR | Status: DC | PRN

## 2014-03-02 MED ORDER — HEPARIN SODIUM (PORCINE) 1000 UNIT/ML DIALYSIS
20.0000 [IU]/kg | Freq: Once | INTRAMUSCULAR | Status: AC
  Administered 2014-03-08: 1900 [IU] via INTRAVENOUS_CENTRAL
  Filled 2014-03-02: qty 2

## 2014-03-02 MED ORDER — HEPARIN SODIUM (PORCINE) 1000 UNIT/ML DIALYSIS: 1000.0000 [IU] | INTRAMUSCULAR | Status: DC | PRN

## 2014-03-02 MED ORDER — CEFAZOLIN SODIUM-DEXTROSE 2-3 GM-% IV SOLR: 2.0000 g | Freq: Once | INTRAVENOUS | Status: DC

## 2014-03-02 MED ORDER — GABAPENTIN 100 MG PO CAPS
200.0000 mg | ORAL_CAPSULE | Freq: Every day | ORAL | Status: DC
  Filled 2014-03-02: qty 2

## 2014-03-02 MED ORDER — INSULIN ASPART 100 UNIT/ML ~~LOC~~ SOLN
0.0000 [IU] | Freq: Three times a day (TID) | SUBCUTANEOUS | Status: DC
  Administered 2014-03-03 (×2): 2 [IU] via SUBCUTANEOUS
  Administered 2014-03-04: 5 [IU] via SUBCUTANEOUS

## 2014-03-02 MED ORDER — SODIUM CHLORIDE 0.9 % IJ SOLN
10.0000 mL | INTRAMUSCULAR | Status: DC | PRN
  Administered 2014-03-02: 10 mL

## 2014-03-02 MED ORDER — DEXTROSE 50 % IV SOLN: 50.0000 mL | Freq: Once | INTRAVENOUS | Status: AC | PRN

## 2014-03-02 NOTE — Progress Notes (Signed)
Hypoglycemic Event  CBG: 64  Treatment: D50 IV 25 mL  Symptoms: Sweaty, Shaky and Hungry  Follow-up CBG: Time:0636 CBG Result:114 Possible Reasons for Event: Unknown  Comments/MD notified: per Dr. Caryn Section just follow hypoglycemic protocol    Cristen Bredeson, Darene Lamer C  Remember to initiate Hypoglycemia Order Set & complete

## 2014-03-02 NOTE — Consult Note (Signed)
MEDICATION RELATED CONSULT NOTE - INITIAL   Pharmacy Consult for Ranexa -is dose adjustment needed for acute renal failure Indication: h/o angina  Allergies  Allergen Reactions  . Macrobid [Nitrofurantoin Macrocrystal] Anaphylaxis and Rash  . Ace Inhibitors Other (See Comments)    hyperkalemia  . Angiotensin Receptor Blockers Other (See Comments)    hyperkalemia  . Influenza Vaccines Itching, Rash and Other (See Comments)    blisters    Patient Measurements: Height: 5\' 1"  (154.9 cm) Weight: 213 lb 10 oz (96.9 kg) IBW/kg (Calculated) : 47.8   Vital Signs: Temp: 98 F (36.7 C) (07/05 1325) Temp src: Oral (07/05 1325) BP: 92/51 mmHg (07/05 1430) Pulse Rate: 57 (07/05 1430) Intake/Output from previous day: 07/04 0701 - 07/05 0700 In: 306 [P.O.:240; IV Piggyback:66] Out: 1850 [Urine:1850] Intake/Output from this shift: Total I/O In: 60 [P.O.:60] Out: -   Labs:  Recent Labs  03/01/14 1846 03/02/14 0510 03/02/14 1058  WBC 9.0 9.3  --   HGB 9.7* 10.1*  --   HCT 30.6* 32.3*  --   PLT 216 245  --   CREATININE 4.74* 4.74* 4.79*  PHOS  --   --  7.5*  ALBUMIN  --   --  2.8*   Estimated Creatinine Clearance: 10.6 ml/min (by C-G formula based on Cr of 4.79).   Microbiology: Recent Results (from the past 720 hour(s))  MRSA PCR SCREENING     Status: None   Collection Time    03/01/14  4:36 PM      Result Value Ref Range Status   MRSA by PCR NEGATIVE  NEGATIVE Final   Comment:            The GeneXpert MRSA Assay (FDA     approved for NASAL specimens     only), is one component of a     comprehensive MRSA colonization     surveillance program. It is not     intended to diagnose MRSA     infection nor to guide or     monitor treatment for     MRSA infections.    Medical History: Past Medical History  Diagnosis Date  . Hypertension   . Heart failure December 07, 2012    Right side  . Hypercalcemia     2nd to hyperparathyroidism  . Thyroid disease    Hyperparathyroidism  . Thyroid disease     Hypothyroidism  . Dyslipidemia   . CHF (congestive heart failure)   . Family history of anesthesia complication     sister - N/V  . Myocardial infarction 10/28/2010  . Peripheral vascular disease   . Short-term memory loss   . Stroke 2010ish    no residual  . Shingles 2010 ish  . Anginal pain   . Coronary artery disease   . ESRD (end stage renal disease)     Renal insufficiency NOT ON HD  . Diabetes mellitus without complication     TYPE 2  . Shortness of breath   . Parathyroid cyst   . Headache(784.0)     CALCIFICATION LT SKULL  . Arthritis     Gout  . Cancer     kidney left removed  . Sleep apnea     Recently test in [place](NEG) NO CPAP     Medications:  Prescriptions prior to admission  Medication Sig Dispense Refill  . acetaminophen (TYLENOL) 500 MG tablet Take 1,000 mg by mouth 3 (three) times daily as needed for mild pain.       Marland Kitchen  albuterol (PROVENTIL HFA;VENTOLIN HFA) 108 (90 BASE) MCG/ACT inhaler Inhale 2 puffs into the lungs every 6 (six) hours as needed for wheezing or shortness of breath.      Marland Kitchen albuterol (PROVENTIL) (2.5 MG/3ML) 0.083% nebulizer solution Take 2.5 mg by nebulization every 6 (six) hours as needed for wheezing or shortness of breath.       Marland Kitchen amLODipine (NORVASC) 5 MG tablet Take 5 mg by mouth daily.      Marland Kitchen aspirin EC 81 MG tablet Take 81 mg by mouth every evening.      . calcitRIOL (ROCALTROL) 0.25 MCG capsule Take 0.25 mcg by mouth 3 (three) times a week. Sunday, Wednesday, and Friday      . furosemide (LASIX) 40 MG tablet Take 40 mg by mouth daily.      Marland Kitchen gabapentin (NEURONTIN) 400 MG capsule Take 400 mg by mouth 3 (three) times daily.      . insulin aspart (NOVOLOG FLEXPEN) 100 UNIT/ML FlexPen Inject 9-22 Units into the skin 3 (three) times daily with meals. Sliding scale      . insulin glargine (LANTUS) 100 UNIT/ML injection Inject 50 Units into the skin at bedtime.       . isosorbide mononitrate  (IMDUR) 60 MG 24 hr tablet Take 120 mg by mouth daily.       Marland Kitchen levothyroxine (SYNTHROID, LEVOTHROID) 88 MCG tablet Take 88 mcg by mouth daily before breakfast.      . metolazone (ZAROXOLYN) 5 MG tablet Take 5 mg by mouth every other day.      . metoprolol (LOPRESSOR) 100 MG tablet Take 100 mg by mouth 2 (two) times daily.      . nitroGLYCERIN (NITROSTAT) 0.4 MG SL tablet Place 0.4 mg under the tongue every 5 (five) minutes as needed for chest pain.      Marland Kitchen nystatin (MYCOSTATIN/NYSTOP) 100000 UNIT/GM POWD Apply 1 g topically every morning.      . Omega-3 Fatty Acids (FISH OIL) 1000 MG CAPS Take 1,000 mg by mouth 2 (two) times daily.      Marland Kitchen oxyCODONE (ROXICODONE) 5 MG immediate release tablet Take 1 tablet (5 mg total) by mouth every 6 (six) hours as needed for severe pain.  30 tablet  0  . oxyCODONE-acetaminophen (PERCOCET/ROXICET) 5-325 MG per tablet Take 1 tablet by mouth every 4 (four) hours as needed for moderate pain or severe pain.      . pravastatin (PRAVACHOL) 20 MG tablet Take 10 mg by mouth daily.       . ranolazine (RANEXA) 500 MG 12 hr tablet Take 500 mg by mouth 2 (two) times daily.      . Ticagrelor (BRILINTA) 90 MG TABS tablet Take 1 tablet (90 mg total) by mouth 2 (two) times daily.  60 tablet     Medications:  Scheduled:  . aspirin EC  81 mg Oral QPM  . calcitRIOL  0.25 mcg Oral Once per day on Mon Wed Fri  . furosemide  160 mg Intravenous Q6H  . gabapentin  200 mg Oral Daily  . [START ON 03/03/2014] heparin  20 Units/kg Dialysis Once in dialysis  . heparin  5,000 Units Subcutaneous 3 times per day  . insulin aspart  0-9 Units Subcutaneous TID WC  . insulin glargine  25 Units Subcutaneous QHS  . isosorbide mononitrate  120 mg Oral Daily  . levothyroxine  88 mcg Oral QAC breakfast  . metolazone  10 mg Oral Daily  . metoprolol  50 mg Oral  BID  . nystatin  1 g Topical TID  . ranolazine  500 mg Oral BID      Assessment: Micromex drug information indicates no dose adjustment  is needed for renal impairment.  Per [employer] alert: Compared to patients with no renal impairment, Cmax was increased between 40% and 50% and increased DBP 10-15 mm/Hg can be seen in patients with mild, moderate or severe renal impairment suggesting a similar increase in exposure in patients with renal failure independent of the degree of impairment. The pharmacokinetics of ranolazine has not been assessed in patients on dialysis. Careful use of Ranexa is indicated when CrCl is < 49ml/min. Ranolazine was not studied in HD patients.  Currently BP is 92/51 (92/51-152/72). No adverse effects noted. Possible adverse effects of ranolazine are constipation, nausea, dizziness, QT prolongation, and syncope. Currently the patient is taking the lower end of recommended dose for angina (500mg  bid; may increase to max of 1000 mg bid based on clinical symptoms).   Ranexa 500 mg bid-no adjustment needed.   Plan:  Currently no adjustment needed in Ranexa 500 mg po BID. Gabapentin dose has already been reduced. No further dosing recommendations.  Pharmacy will sign off. Please consult pharmacy again if further assistance needed.  Thank you,  Nicole Cella, RPh Clinical Pharmacist Pager: [phone] 03/02/2014,3:14 PM

## 2014-03-02 NOTE — Progress Notes (Signed)
Hypoglycemic Event  CBG: 63  Treatment: D50 IV 25 mL  Symptoms: Hungry  Follow-up CBG: Time: 08:57 CBG Result:117  Possible Reasons for Event: Inadequate meal intake  Comments/MD notified:Short    [name] R  Remember to initiate Hypoglycemia Order Set & complete

## 2014-03-02 NOTE — Progress Notes (Signed)
  [facility] Progress Note   Subjective: 1800 cc UOP since admit on high dose IV lasix  Filed Vitals:   03/01/14 1639 03/01/14 2049 03/02/14 0500 03/02/14 0920  BP: 120/46 139/69 145/42 152/72  Pulse: 56 60 59 55  Temp: 97.8 F (36.6 C) 97.4 F (36.3 C) 97.6 F (36.4 C) 98 F (36.7 C)  TempSrc: Oral Oral Oral Oral  Resp: 20 22 24 18   Weight:  93.5 kg (206 lb 2.1 oz)    SpO2: 100% 95% 98% 97%   Exam: No distress +JVD Chest L basilar rales, R clear RRR no MRG Abd obese, soft, NTND 2+ bilat pitting LE edema, +UE edema 1+ LUA AVF patent, immature Neuro is alert, Ox 3       Assessment: 1 CKD stage V 2 Volume excess / pulm edema- still very overloaded 3 HTN reduced BP meds for now 4 Anemia Hb 9.7 5 HPTH on vit D 6 DM2 on insulin 7 Access - LUA BVT 2nd stage April and fistulogram in June; f/b VVS  Plan- HD today and tomorrow for volume excess in setting of advanced CKD stage V. Will place temp cath. Need to get a tunneled HD cath in prior to d/c and she is on ticagrelor; this may need to be held to have the tunneled cath placed, have d/w primary and they will look into this.     Kelly Splinter MD  pager [phone]    cell [phone]  03/02/2014, 10:47 AM     Recent Labs Lab 03/01/14 1846 03/02/14 0510  NA  --  139  K  --  4.1  CL  --  99  CO2  --  20  GLUCOSE  --  48*  BUN  --  98*  CREATININE 4.74* 4.74*  CALCIUM  --  9.1   No results found for this basename: AST, ALT, ALKPHOS, BILITOT, PROT, ALBUMIN,  in the last 168 hours  Recent Labs Lab 03/01/14 1846 03/02/14 0510  WBC 9.0 9.3  HGB 9.7* 10.1*  HCT 30.6* 32.3*  MCV 87.2 87.1  PLT 216 245   . aspirin EC  81 mg Oral QPM  . calcitRIOL  0.25 mcg Oral Once per day on Mon Wed Fri  . [START ON 03/03/2014]  ceFAZolin (ANCEF) IV  2 g Intravenous Once  . furosemide  160 mg Intravenous Q6H  . gabapentin  200 mg Oral Daily  . heparin  5,000 Units Subcutaneous 3 times per day  . insulin aspart   0-9 Units Subcutaneous TID WC  . insulin glargine  25 Units Subcutaneous QHS  . isosorbide mononitrate  120 mg Oral Daily  . levothyroxine  88 mcg Oral QAC breakfast  . metolazone  10 mg Oral Daily  . metoprolol  50 mg Oral BID  . nystatin  1 g Topical TID  . ranolazine  500 mg Oral BID  . ticagrelor  90 mg Oral BID     acetaminophen, acetaminophen, albuterol, alum & mag hydroxide-simeth, dextrose, ondansetron (ZOFRAN) IV, ondansetron

## 2014-03-02 NOTE — Procedures (Signed)
I was present at this dialysis session, have reviewed the session itself and made  appropriate changes  Kelly Splinter MD (pgr) [phone]    ([phone] 03/02/2014, 12:32 PM

## 2014-03-02 NOTE — Progress Notes (Addendum)
[facility] PROGRESS NOTE  [name]:[id number] DOB: 23-Jul-1935 DOA: 03/01/2014 PCP: Rochel Brome, MD  Assessment/Plan  Acute on chronic renal failure, deemed ESRD by nephrology.  Possibility of dialysis had been anticipated and she has undergone fistula placement by Dr. Scot Dock with reevaluation by fistulogram a few weeks ago.   -  Not responding to lasix -  Temporary HD catheter being placed by Dr. Jonnie Finner -  Will touch base with Dr. Scot Dock tomorrow about scheduling tunneled HD catheter until fistula matured  DM type 2 (diabetes mellitus, type 2) with hypoglycemia this morning - decrease lantus by 50% to 25 units - hypoglycemia protocol - continue low dose SSI  - Diabetic diet   HTN (hypertension), BP mildly elevated but will likely trend down with HD -Continue amlodipine and metoprolol   CAD in native artery:  Last cath 11/2011, LAD 50%; mid L Cx lum irregularities; mid RCA with 90% lesion with thrombus s/p stent to mid RCA.  Started at ticagrelor at that time.   -  Called to verify that this was her last intervention procedure - patient states it was.  It appears that she held her ticagrelor for several days prior to fistula placement earlier this year so as long as she has not had further interventions since spring 2015, should be safe to hold for tunneled HD placement.   -  Continue Imdur, Ranexa -  Continue ASA -  Hold ticagrelor (recieved AM dose on 7/5)  Unspecified hypothyroidism  - Continue Synthroid  Diet:  Renal/diabetic  Access:  PIV, temp HD cath IVF:  None Proph:  heparin  Code Status: full Family Communication: patient alone Disposition Plan:   ESRD and will need to start CLIPP process   Consultants:  Nephrology  Vascular surgery, Dr. Scot Dock  Procedures:  Temp HD cath 7/5 >>  CXR  Antibiotics:  None   HPI/Subjective:  Still somewhat SOB, but improved from earlier.  Still edematous.  Denies confusion, nausea, early  satiety.  Objective: Filed Vitals:   03/01/14 1639 03/01/14 2049 03/02/14 0500 03/02/14 0920  BP: 120/46 139/69 145/42 152/72  Pulse: 56 60 59 55  Temp: 97.8 F (36.6 C) 97.4 F (36.3 C) 97.6 F (36.4 C) 98 F (36.7 C)  TempSrc: Oral Oral Oral Oral  Resp: 20 22 24 18   Weight:  93.5 kg (206 lb 2.1 oz)    SpO2: 100% 95% 98% 97%    Intake/Output Summary (Last 24 hours) at 03/02/14 1129 Last data filed at 03/02/14 1000  Gross per 24 hour  Intake    366 ml  Output   1850 ml  Net  -1484 ml   Filed Weights   03/01/14 2049  Weight: 93.5 kg (206 lb 2.1 oz)    Exam:   General:  CF, No acute distress  HEENT:  NCAT, MMM  Cardiovascular:  RRR, nl S1, S2 no mrg, 2+ pulses, warm extremities  Respiratory:  Rales to apices bilaterally, no wheezes or rhonchi, no increased WOB  Abdomen:   NABS, soft, NT/ND  MSK:   Normal tone and bulk, 2+ bilatearl lower extremity soft pitting edema and 2+ left hand and arm edema (recent duplex and fistulogram neg)  Neuro:  Grossly intact  Data Reviewed: Basic Metabolic Panel:  Recent Labs Lab 03/01/14 1846 03/02/14 0510  NA  --  139  K  --  4.1  CL  --  99  CO2  --  20  GLUCOSE  --  48*  BUN  --  98*  CREATININE 4.74* 4.74*  CALCIUM  --  9.1   Liver Function Tests: No results found for this basename: AST, ALT, ALKPHOS, BILITOT, PROT, ALBUMIN,  in the last 168 hours No results found for this basename: LIPASE, AMYLASE,  in the last 168 hours No results found for this basename: AMMONIA,  in the last 168 hours CBC:  Recent Labs Lab 03/01/14 1846 03/02/14 0510  WBC 9.0 9.3  HGB 9.7* 10.1*  HCT 30.6* 32.3*  MCV 87.2 87.1  PLT 216 245   Cardiac Enzymes: No results found for this basename: CKTOTAL, CKMB, CKMBINDEX, TROPONINI,  in the last 168 hours BNP (last 3 results) No results found for this basename: PROBNP,  in the last 8760 hours CBG:  Recent Labs Lab 03/02/14 0531 03/02/14 0604 03/02/14 0636 03/02/14 0807  03/02/14 0856  GLUCAP 48* 64* 114* 63* 117*    Recent Results (from the past 240 hour(s))  MRSA PCR SCREENING     Status: None   Collection Time    03/01/14  4:36 PM      Result Value Ref Range Status   MRSA by PCR NEGATIVE  NEGATIVE Final   Comment:            The GeneXpert MRSA Assay (FDA     approved for NASAL specimens     only), is one component of a     comprehensive MRSA colonization     surveillance program. It is not     intended to diagnose MRSA     infection nor to guide or     monitor treatment for     MRSA infections.     Studies: Dg Chest Port 1v Same Day  03/02/2014   CLINICAL DATA:  Shortness of breath.  EXAM: PORTABLE CHEST - 1 VIEW SAME DAY  COMPARISON:  02/28/2014  FINDINGS: Study limited by body habitus and portable nature of the study. There is cardiomegaly with vascular congestion. No definite focal airspace opacity or effusion. Diffuse interstitial prominence may reflect early interstitial edema.  IMPRESSION: Limited study by body habitus. Cardiomegaly with vascular congestion and suggestion of early interstitial edema.   Electronically Signed   By: Rolm Baptise M.D.   On: 03/02/2014 01:08    Scheduled Meds: . aspirin EC  81 mg Oral QPM  . calcitRIOL  0.25 mcg Oral Once per day on Mon Wed Fri  . furosemide  160 mg Intravenous Q6H  . gabapentin  200 mg Oral Daily  . heparin  5,000 Units Subcutaneous 3 times per day  . insulin aspart  0-9 Units Subcutaneous TID WC  . insulin glargine  25 Units Subcutaneous QHS  . isosorbide mononitrate  120 mg Oral Daily  . levothyroxine  88 mcg Oral QAC breakfast  . metolazone  10 mg Oral Daily  . metoprolol  50 mg Oral BID  . nystatin  1 g Topical TID  . ranolazine  500 mg Oral BID  . ticagrelor  90 mg Oral BID   Continuous Infusions:   Principal Problem:   Renal failure (ARF), acute on chronic Active Problems:   DM type 2 (diabetes mellitus, type 2)   HTN (hypertension)   CAD in native artery   Unspecified  hypothyroidism    Time spent: 30 min    Hayato Guaman, [facility] Pager [phone]. If 7PM-7AM, please contact night-coverage at [email or web address], password [facility] 03/02/2014, 11:29 AM  LOS: 1 day

## 2014-03-03 ENCOUNTER — Other Ambulatory Visit: Payer: Self-pay

## 2014-03-03 ENCOUNTER — Telehealth: Payer: Self-pay | Admitting: Vascular Surgery

## 2014-03-03 DIAGNOSIS — I770 Arteriovenous fistula, acquired: Secondary | ICD-10-CM

## 2014-03-03 DIAGNOSIS — Z48812 Encounter for surgical aftercare following surgery on the circulatory system: Secondary | ICD-10-CM

## 2014-03-03 DIAGNOSIS — N186 End stage renal disease: Secondary | ICD-10-CM

## 2014-03-03 LAB — GLUCOSE, CAPILLARY
GLUCOSE-CAPILLARY: 168 mg/dL — AB (ref 70–99)
GLUCOSE-CAPILLARY: 187 mg/dL — AB (ref 70–99)
GLUCOSE-CAPILLARY: 246 mg/dL — AB (ref 70–99)
GLUCOSE-CAPILLARY: 281 mg/dL — AB (ref 70–99)
GLUCOSE-CAPILLARY: 313 mg/dL — AB (ref 70–99)
Glucose-Capillary: 294 mg/dL — ABNORMAL HIGH (ref 70–99)
Glucose-Capillary: 179 mg/dL — ABNORMAL HIGH (ref 70–99)
Glucose-Capillary: 244 mg/dL — ABNORMAL HIGH (ref 70–99)

## 2014-03-03 MED ORDER — COLCHICINE 0.6 MG PO TABS
0.6000 mg | ORAL_TABLET | Freq: Once | ORAL | Status: AC
  Administered 2014-03-03: 0.6 mg via ORAL
  Filled 2014-03-03: qty 1

## 2014-03-03 NOTE — Progress Notes (Signed)
Patient ID: [name], female   DOB: 03/11/1935, 79 y.o.   MRN: [id number]   [facility] Progress Note   Assessment/ Plan:   1 ESRD: New start to HD- currently via [facility]IJ temp HD catheter and planned for [facility] by [facility], CLIP process started for OP HD unit placement 2 Volume excess / pulm edema- improving with HD/UF 3 HTN reduced BP meds for now  4 Anemia: check iron stores and stat ESA 5 HPTH on vit D  6 DM2 on insulin  7 Access - LUA BVT 2nd stage April and fistulogram in June; f/b [facility]  Subjective:   Reports to be feeling fair and appreciative of care in the hospital   Objective:   BP 137/57  Pulse 63  Temp(Src) 97.7 F (36.5 C) (Oral)  Resp 19  Ht 5\' 1"  (1.549 m)  Wt 94.1 kg (207 lb 7.3 oz)  BMI 39.22 kg/m2  SpO2 98%  Physical Exam: ASN:[id number] resting in bed FXT:KWIOX RRR, normal S1 and S2 Resp:Coarse BS bilaterally, no rales/rhonchi BDZ:HGDJ, obese, NT Ext:2+ LE edema  Labs: BMET  Recent Labs Lab 03/01/14 1846 03/02/14 0510 03/02/14 1058  NA  --  139 135*  K  --  4.1 4.7  CL  --  99 96  CO2  --  20 20  GLUCOSE  --  48* 163*  BUN  --  98* 98*  CREATININE 4.74* 4.74* 4.79*  CALCIUM  --  9.1 8.9  PHOS  --   --  7.5*   CBC  Recent Labs Lab 03/01/14 1846 03/02/14 0510  WBC 9.0 9.3  HGB 9.7* 10.1*  HCT 30.6* 32.3*  MCV 87.2 87.1  PLT 216 245   Medications:    . aspirin EC  81 mg Oral QPM  . calcitRIOL  0.25 mcg Oral Once per day on Mon Wed Fri  . furosemide  160 mg Intravenous Q6H  . gabapentin  200 mg Oral Daily  . heparin  20 Units/kg Dialysis Once in dialysis  . heparin  5,000 Units Subcutaneous 3 times per day  . insulin aspart  0-9 Units Subcutaneous TID WC  . insulin glargine  25 Units Subcutaneous QHS  . isosorbide mononitrate  120 mg Oral Daily  . levothyroxine  88 mcg Oral QAC breakfast  . metolazone  10 mg Oral Daily  . metoprolol  50 mg Oral BID  . nystatin  1 g Topical TID  . ranolazine  500 mg Oral BID    Elmarie Shiley, MD 03/03/2014, 1:21 PM

## 2014-03-03 NOTE — Progress Notes (Signed)
Utilization review completed. Joah Patlan, RN, BSN. 

## 2014-03-03 NOTE — Telephone Encounter (Addendum)
Message copied by Gena Fray on Mon Mar 03, 2014  2:28 PM ------      Message from: Denman George      Created: Mon Mar 03, 2014 10:52 AM      Regarding: 6 wk f/u with duplex                   ----- Message -----         From: Ulyses Amor, PA-C         Sent: 03/03/2014   9:37 AM           To: Vvs Charge Pool            6 weeks f/u with Dr. Scot Dock needs duplex of fistula. thx mc ------  03/03/14: already scheduled for 4 week follow up on 04/02/14- dpm

## 2014-03-03 NOTE — Care Management Note (Signed)
CARE MANAGEMENT NOTE 03/03/2014  Patient:  [name]   Account Number:  [id number]  Date Initiated:  03/03/2014  Documentation initiated by:  Wyn Nettle  Subjective/Objective Assessment:   Consult for CLIP precess.     Action/Plan:   CLIP process must be ordered by Nephrologist and this service is provided by the hemodilaysis center. CM is not able to provide this service. Will follow for any d/c needs for DME or HH.   Anticipated DC Date:  03/05/2014   Anticipated DC Plan:  HOME/SELF CARE         Choice offered to / List presented to:             Status of service:   Medicare Important Message given?   (If response is "NO", the following Medicare IM given date fields will be blank) Date Medicare IM given:   Medicare IM given by:   Date Additional Medicare IM given:   Additional Medicare IM given by:    Discharge Disposition:    Per UR Regulation:    If discussed at Long Length of Stay Meetings, dates discussed:    Comments:

## 2014-03-03 NOTE — Progress Notes (Signed)
Notified Bethena Roys K in HD to begin CLIPP process.

## 2014-03-03 NOTE — Progress Notes (Signed)
Patient ID: [name], female   DOB: 1935-04-18, 79 y.o.   MRN: [id number] Asked to see to place a tunneled dialysis catheter. Patient has a basilic vein transposition placed by Dr. Scot Dock. She recently underwent shuntogram of this which showed no evidence of stenosis. By physical exam this is patent with good size maturation. He is scheduled to see Dr. Scot Dock in [place] to determine when this can be used for hemodialysis access. Patient has progressed to end-stage renal disease and is now admitted. She had placement of a temporary catheter had hemodialysis yesterday and has hemodialysis plan via a temporary catheter again today. Her anticoagulation has been held. It was safe to proceed with a tunneled catheter tomorrow. We will schedule this in the operating room. Discussed with the patient who understands plan

## 2014-03-03 NOTE — Progress Notes (Signed)
[facility] PROGRESS NOTE  [name] BSJ:[id number] DOB: Oct 10, 1934 DOA: 03/01/2014 PCP: Rochel Brome, MD  Assessment/Plan  Acute on chronic renal failure, deemed ESRD by nephrology.  Possibility of dialysis had been anticipated and she has undergone fistula placement by Dr. Scot Dock with reevaluation by fistulogram a few weeks ago.   -  Not responding well to lasix -  Temporary HD catheter being placed by Dr. Jonnie Finner -  Vascular surgery consult for tunneled HD cath:  Planned for tomorrow  DM type 2 (diabetes mellitus, type 2), CBG stable - continue lantus 25 units - hypoglycemia protocol - continue low dose SSI  - Diabetic diet   HTN (hypertension), BP dropped during HD but still elevated.  Will likely continue to trend down as more fluid removed -Continue amlodipine and metoprolol   CAD in native artery:  Last cath 11/2011, LAD 50%; mid L Cx lum irregularities; mid RCA with 90% lesion with thrombus s/p stent to mid RCA.  Started at ticagrelor at that time.    -  Continue Imdur, Ranexa -  Continue ASA -  Hold ticagrelor (recieved AM dose on 7/5)  Unspecified hypothyroidism  - Continue Synthroid  Gout flare left MTP joint -  Colchicine x 1 -  Avoid NSAIDS unless okay by nephrology -  Avoid prednisone unless colch fails due to DM  Diet:  Renal/diabetic  Access:  PIV, temp HD cath IVF:  None Proph:  heparin  Code Status: full Family Communication: patient alone Disposition Plan:   ESRD and will need to start CLIP process.  HD cath tomorrow by vascular.     Consultants:  Nephrology  Vascular surgery, Dr. Scot Dock  Procedures:  Temp HD cath 7/5 >>  CXR  Antibiotics:  None   HPI/Subjective:  SOB markedly improved.  Legs much less swollen.  Pain in left MTP joint  Objective: Filed Vitals:   03/03/14 1530 03/03/14 1535 03/03/14 1600 03/03/14 1630  BP: 161/65 156/60 143/61 144/50  Pulse: 62 77 78 76  Temp: 98 F (36.7 C)     TempSrc: Oral      Resp: 20     Height:      Weight: 94.1 kg (207 lb 7.3 oz)     SpO2: 98%       Intake/Output Summary (Last 24 hours) at 03/03/14 1657 Last data filed at 03/03/14 0851  Gross per 24 hour  Intake    944 ml  Output   1275 ml  Net   -331 ml   Filed Weights   03/02/14 2158 03/03/14 0500 03/03/14 1530  Weight: 94.1 kg (207 lb 7.3 oz) 94.1 kg (207 lb 7.3 oz) 94.1 kg (207 lb 7.3 oz)    Exam:   General:  CF, No acute distress  HEENT:  NCAT, MMM  Cardiovascular:  RRR, nl S1, S2 no mrg, 2+ pulses, warm extremities  Respiratory:  CTAB, no increased WOB  Abdomen:   NABS, soft, NT/ND  MSK:   Normal tone and bulk, 1+ bilatearl lower extremity soft pitting edema and 1+ left hand and arm edema (recent duplex and fistulogram neg).  Mild pinkness and TTP of the 1st MTP joint  Neuro:  Grossly intact  Data Reviewed: Basic Metabolic Panel:  Recent Labs Lab 03/01/14 1846 03/02/14 0510 03/02/14 1058  NA  --  139 135*  K  --  4.1 4.7  CL  --  99 96  CO2  --  20 20  GLUCOSE  --  48* 163*  BUN  --  98* 98*  CREATININE 4.74* 4.74* 4.79*  CALCIUM  --  9.1 8.9  PHOS  --   --  7.5*   Liver Function Tests:  Recent Labs Lab 03/02/14 1058  ALBUMIN 2.8*   No results found for this basename: LIPASE, AMYLASE,  in the last 168 hours No results found for this basename: AMMONIA,  in the last 168 hours CBC:  Recent Labs Lab 03/01/14 1846 03/02/14 0510  WBC 9.0 9.3  HGB 9.7* 10.1*  HCT 30.6* 32.3*  MCV 87.2 87.1  PLT 216 245   Cardiac Enzymes: No results found for this basename: CKTOTAL, CKMB, CKMBINDEX, TROPONINI,  in the last 168 hours BNP (last 3 results) No results found for this basename: PROBNP,  in the last 8760 hours CBG:  Recent Labs Lab 03/03/14 0150 03/03/14 0353 03/03/14 0629 03/03/14 0753 03/03/14 1218  GLUCAP 294* 246* 187* 168* 179*    Recent Results (from the past 240 hour(s))  MRSA PCR SCREENING     Status: None   Collection Time    03/01/14  4:36  PM      Result Value Ref Range Status   MRSA by PCR NEGATIVE  NEGATIVE Final   Comment:            The GeneXpert MRSA Assay (FDA     approved for NASAL specimens     only), is one component of a     comprehensive MRSA colonization     surveillance program. It is not     intended to diagnose MRSA     infection nor to guide or     monitor treatment for     MRSA infections.     Studies: Dg Chest Port 1 View  03/02/2014   CLINICAL DATA:  Status post central line placement  EXAM: PORTABLE CHEST - 1 VIEW  COMPARISON:  Portable chest x-ray of March 01, 2014  FINDINGS: A right internal jugular venous catheter is been placed with the tip at the level of the distal third of the superior vena cava. There is no postprocedure pneumothorax or hemo thorax. The lungs are adequately inflated. The left hemidiaphragm is obscured. The interstitial markings are mildly prominent. The cardiac silhouette is enlarged and the pulmonary vascularity is engorged.  IMPRESSION: There is no postprocedure complication following placement of the right internal jugular venous catheter.   Electronically Signed   By: David  Martinique   On: 03/02/2014 13:21   Dg Chest Port 1v Same Day  03/02/2014   CLINICAL DATA:  Shortness of breath.  EXAM: PORTABLE CHEST - 1 VIEW SAME DAY  COMPARISON:  02/28/2014  FINDINGS: Study limited by body habitus and portable nature of the study. There is cardiomegaly with vascular congestion. No definite focal airspace opacity or effusion. Diffuse interstitial prominence may reflect early interstitial edema.  IMPRESSION: Limited study by body habitus. Cardiomegaly with vascular congestion and suggestion of early interstitial edema.   Electronically Signed   By: Rolm Baptise M.D.   On: 03/02/2014 01:08    Scheduled Meds: . aspirin EC  81 mg Oral QPM  . calcitRIOL  0.25 mcg Oral Once per day on Mon Wed Fri  . furosemide  160 mg Intravenous Q6H  . gabapentin  200 mg Oral Daily  . heparin  20 Units/kg Dialysis  Once in dialysis  . heparin  5,000 Units Subcutaneous 3 times per day  . insulin aspart  0-9 Units Subcutaneous TID WC  . insulin glargine  25 Units Subcutaneous  QHS  . isosorbide mononitrate  120 mg Oral Daily  . levothyroxine  88 mcg Oral QAC breakfast  . metolazone  10 mg Oral Daily  . metoprolol  50 mg Oral BID  . nystatin  1 g Topical TID  . ranolazine  500 mg Oral BID   Continuous Infusions:   Principal Problem:   Renal failure (ARF), acute on chronic Active Problems:   DM type 2 (diabetes mellitus, type 2)   HTN (hypertension)   CAD in native artery   Unspecified hypothyroidism    Time spent: 30 min    Ahren Pettinger, [facility] Pager [phone]. If 7PM-7AM, please contact night-coverage at [email or web address], password [facility] 03/03/2014, 4:57 PM  LOS: 2 days

## 2014-03-04 ENCOUNTER — Inpatient Hospital Stay (HOSPITAL_COMMUNITY): Payer: Medicare Other | Admitting: Anesthesiology

## 2014-03-04 ENCOUNTER — Encounter (HOSPITAL_COMMUNITY)
Admission: AD | Disposition: A | Discharge status: Home / Self Care | Payer: Self-pay | Source: Other Acute Inpatient Hospital | Attending: Internal Medicine

## 2014-03-04 ENCOUNTER — Inpatient Hospital Stay (HOSPITAL_COMMUNITY): Payer: Medicare Other

## 2014-03-04 ENCOUNTER — Encounter (HOSPITAL_COMMUNITY): Payer: Medicare Other | Admitting: Anesthesiology

## 2014-03-04 ENCOUNTER — Encounter (HOSPITAL_COMMUNITY): Payer: Self-pay | Admitting: Anesthesiology

## 2014-03-04 DIAGNOSIS — N185 Chronic kidney disease, stage 5: Secondary | ICD-10-CM

## 2014-03-04 HISTORY — PX: INSERTION OF DIALYSIS CATHETER: SHX1324

## 2014-03-04 LAB — GLUCOSE, CAPILLARY
GLUCOSE-CAPILLARY: 339 mg/dL — AB (ref 70–99)
GLUCOSE-CAPILLARY: 248 mg/dL — AB (ref 70–99)
GLUCOSE-CAPILLARY: 158 mg/dL — AB (ref 70–99)
GLUCOSE-CAPILLARY: 139 mg/dL — AB (ref 70–99)
GLUCOSE-CAPILLARY: 160 mg/dL — AB (ref 70–99)
GLUCOSE-CAPILLARY: 167 mg/dL — AB (ref 70–99)
Glucose-Capillary: 186 mg/dL — ABNORMAL HIGH (ref 70–99)

## 2014-03-04 LAB — URIC ACID: Uric Acid, Serum: 6.4 mg/dL (ref 2.4–7.0)

## 2014-03-04 LAB — HEPATITIS B SURFACE ANTIBODY,QUALITATIVE: HEP B S AB: NEGATIVE

## 2014-03-04 LAB — HEPATITIS B CORE ANTIBODY, TOTAL: Hep B Core Total Ab: NONREACTIVE

## 2014-03-04 SURGERY — INSERTION OF DIALYSIS CATHETER
Anesthesia: Monitor Anesthesia Care | Site: Neck | Laterality: Left

## 2014-03-04 MED ORDER — LIDOCAINE HCL (CARDIAC) 20 MG/ML IV SOLN
INTRAVENOUS | Status: AC
  Filled 2014-03-04: qty 5

## 2014-03-04 MED ORDER — CEFAZOLIN SODIUM-DEXTROSE 2-3 GM-% IV SOLR
2.0000 g | Freq: Once | INTRAVENOUS | Status: AC
  Administered 2014-03-04: 2 g via INTRAVENOUS

## 2014-03-04 MED ORDER — SODIUM CHLORIDE 0.9 % IV SOLN
INTRAVENOUS | Status: DC
  Administered 2014-03-04: 11:00:00 via INTRAVENOUS
  Administered 2014-03-05: 10 mL/h via INTRAVENOUS

## 2014-03-04 MED ORDER — INSULIN GLARGINE 100 UNIT/ML ~~LOC~~ SOLN
35.0000 [IU] | Freq: Every day | SUBCUTANEOUS | Status: DC
  Administered 2014-03-04 – 2014-03-06 (×3): 35 [IU] via SUBCUTANEOUS
  Filled 2014-03-04 (×4): qty 0.35

## 2014-03-04 MED ORDER — LIDOCAINE-EPINEPHRINE (PF) 1 %-1:200000 IJ SOLN
INTRAMUSCULAR | Status: DC | PRN
  Administered 2014-03-04: 20 mL via INTRADERMAL

## 2014-03-04 MED ORDER — PROPOFOL 10 MG/ML IV BOLUS
INTRAVENOUS | Status: AC
  Filled 2014-03-04: qty 20

## 2014-03-04 MED ORDER — LABETALOL HCL 5 MG/ML IV SOLN
INTRAVENOUS | Status: AC
  Filled 2014-03-04: qty 4

## 2014-03-04 MED ORDER — SODIUM CHLORIDE 0.9 % IR SOLN
Status: DC | PRN
  Administered 2014-03-04: 14:00:00

## 2014-03-04 MED ORDER — PROPOFOL INFUSION 10 MG/ML OPTIME
INTRAVENOUS | Status: DC | PRN
  Administered 2014-03-04: 75 ug/kg/min via INTRAVENOUS

## 2014-03-04 MED ORDER — HEPARIN SODIUM (PORCINE) 1000 UNIT/ML IJ SOLN
INTRAMUSCULAR | Status: AC
  Filled 2014-03-04: qty 1

## 2014-03-04 MED ORDER — INSULIN ASPART 100 UNIT/ML ~~LOC~~ SOLN
0.0000 [IU] | Freq: Three times a day (TID) | SUBCUTANEOUS | Status: DC
  Administered 2014-03-04 – 2014-03-05 (×3): 3 [IU] via SUBCUTANEOUS
  Administered 2014-03-06: 11 [IU] via SUBCUTANEOUS
  Administered 2014-03-06: 3 [IU] via SUBCUTANEOUS
  Administered 2014-03-06: 8 [IU] via SUBCUTANEOUS
  Administered 2014-03-07 (×2): 11 [IU] via SUBCUTANEOUS
  Administered 2014-03-07: 5 [IU] via SUBCUTANEOUS

## 2014-03-04 MED ORDER — MIDAZOLAM HCL 2 MG/2ML IJ SOLN
INTRAMUSCULAR | Status: AC
  Filled 2014-03-04: qty 2

## 2014-03-04 MED ORDER — HEPARIN SODIUM (PORCINE) 1000 UNIT/ML IJ SOLN
INTRAMUSCULAR | Status: DC | PRN
  Administered 2014-03-04: 4.6 mL

## 2014-03-04 MED ORDER — SODIUM CHLORIDE 0.9 % IV SOLN
INTRAVENOUS | Status: DC | PRN
  Administered 2014-03-04: 13:00:00 via INTRAVENOUS

## 2014-03-04 MED ORDER — FENTANYL CITRATE 0.05 MG/ML IJ SOLN: 25.0000 ug | INTRAMUSCULAR | Status: DC | PRN

## 2014-03-04 MED ORDER — ONDANSETRON HCL 4 MG/2ML IJ SOLN
INTRAMUSCULAR | Status: AC
  Filled 2014-03-04: qty 2

## 2014-03-04 MED ORDER — ROCURONIUM BROMIDE 50 MG/5ML IV SOLN
INTRAVENOUS | Status: AC
  Filled 2014-03-04: qty 1

## 2014-03-04 MED ORDER — METOCLOPRAMIDE HCL 5 MG/ML IJ SOLN: 10.0000 mg | Freq: Once | INTRAMUSCULAR | Status: DC | PRN

## 2014-03-04 MED ORDER — FENTANYL CITRATE 0.05 MG/ML IJ SOLN
INTRAMUSCULAR | Status: AC
  Filled 2014-03-04: qty 5

## 2014-03-04 MED ORDER — OXYCODONE HCL 5 MG PO TABS: 5.0000 mg | ORAL_TABLET | Freq: Once | ORAL | Status: DC | PRN

## 2014-03-04 MED ORDER — OXYCODONE HCL 5 MG/5ML PO SOLN: 5.0000 mg | Freq: Once | ORAL | Status: DC | PRN

## 2014-03-04 MED ORDER — FENTANYL CITRATE 0.05 MG/ML IJ SOLN
INTRAMUSCULAR | Status: DC | PRN
  Administered 2014-03-04 (×2): 25 ug via INTRAVENOUS

## 2014-03-04 MED ORDER — INSULIN ASPART 100 UNIT/ML ~~LOC~~ SOLN
0.0000 [IU] | Freq: Every day | SUBCUTANEOUS | Status: DC
  Administered 2014-03-04: 2 [IU] via SUBCUTANEOUS
  Administered 2014-03-05: 5 [IU] via SUBCUTANEOUS
  Administered 2014-03-06 – 2014-03-07 (×2): 4 [IU] via SUBCUTANEOUS

## 2014-03-04 MED ORDER — ONDANSETRON HCL 4 MG/2ML IJ SOLN
INTRAMUSCULAR | Status: DC | PRN
  Administered 2014-03-04: 4 mg via INTRAVENOUS

## 2014-03-04 SURGICAL SUPPLY — 50 items
ADH SKN CLS APL DERMABOND .7 (GAUZE/BANDAGES/DRESSINGS) ×1
BAG DECANTER FOR FLEXI CONT (MISCELLANEOUS) ×1 IMPLANT
BLADE 10 SAFETY STRL DISP (BLADE) ×3 IMPLANT
CATH CANNON HEMO 15F 50CM (CATHETERS) IMPLANT
CATH CANNON HEMO 15FR 19 (HEMODIALYSIS SUPPLIES) IMPLANT
CATH CANNON HEMO 15FR 23CM (HEMODIALYSIS SUPPLIES) ×2 IMPLANT
CATH CANNON HEMO 15FR 31CM (HEMODIALYSIS SUPPLIES) IMPLANT
CATH CANNON HEMO 15FR 32 (HEMODIALYSIS SUPPLIES) IMPLANT
CATH CANNON HEMO 15FR 32CM (HEMODIALYSIS SUPPLIES) IMPLANT
CATH STRAIGHT 5FR 65CM (CATHETERS) IMPLANT
COVER PROBE W GEL 5X96 (DRAPES) ×3 IMPLANT
COVER SURGICAL LIGHT HANDLE (MISCELLANEOUS) ×3 IMPLANT
DERMABOND ADVANCED (GAUZE/BANDAGES/DRESSINGS) ×2
DERMABOND ADVANCED .7 DNX12 (GAUZE/BANDAGES/DRESSINGS) IMPLANT
DRAPE C-ARM 42X72 X-RAY (DRAPES) ×3 IMPLANT
DRAPE CHEST BREAST 15X10 FENES (DRAPES) ×3 IMPLANT
GAUZE SPONGE 2X2 8PLY STRL LF (GAUZE/BANDAGES/DRESSINGS) ×1 IMPLANT
GAUZE SPONGE 4X4 16PLY XRAY LF (GAUZE/BANDAGES/DRESSINGS) ×3 IMPLANT
GLOVE BIO SURGEON STRL SZ7 (GLOVE) ×3 IMPLANT
GLOVE BIOGEL PI IND STRL 7.5 (GLOVE) ×1 IMPLANT
GLOVE BIOGEL PI INDICATOR 7.5 (GLOVE) ×2
GLOVE SURG SS PI 7.0 STRL IVOR (GLOVE) ×6 IMPLANT
GOWN STRL REUS W/ TWL LRG LVL3 (GOWN DISPOSABLE) ×2 IMPLANT
GOWN STRL REUS W/ TWL XL LVL3 (GOWN DISPOSABLE) IMPLANT
GOWN STRL REUS W/TWL LRG LVL3 (GOWN DISPOSABLE) ×6
GOWN STRL REUS W/TWL XL LVL3 (GOWN DISPOSABLE) ×3
KIT BASIN OR (CUSTOM PROCEDURE TRAY) ×3 IMPLANT
KIT ROOM TURNOVER OR (KITS) ×3 IMPLANT
NDL 18GX1X1/2 (RX/OR ONLY) (NEEDLE) ×1 IMPLANT
NDL HYPO 25GX1X1/2 BEV (NEEDLE) ×1 IMPLANT
NEEDLE 18GX1X1/2 (RX/OR ONLY) (NEEDLE) ×3 IMPLANT
NEEDLE HYPO 25GX1X1/2 BEV (NEEDLE) ×3 IMPLANT
NS IRRIG 1000ML POUR BTL (IV SOLUTION) ×1 IMPLANT
PACK SURGICAL SETUP 50X90 (CUSTOM PROCEDURE TRAY) ×3 IMPLANT
PAD ARMBOARD 7.5X6 YLW CONV (MISCELLANEOUS) ×6 IMPLANT
SET MICROPUNCTURE 5F STIFF (MISCELLANEOUS) IMPLANT
SOAP 2 % CHG 4 OZ (WOUND CARE) ×3 IMPLANT
SPONGE GAUZE 2X2 STER 10/PKG (GAUZE/BANDAGES/DRESSINGS) ×2
SUT ETHILON 3 0 PS 1 (SUTURE) ×3 IMPLANT
SUT MNCRL AB 4-0 PS2 18 (SUTURE) ×3 IMPLANT
SYR 20CC LL (SYRINGE) ×6 IMPLANT
SYR 3ML LL SCALE MARK (SYRINGE) ×3 IMPLANT
SYR 5ML LL (SYRINGE) ×3 IMPLANT
SYR CONTROL 10ML LL (SYRINGE) ×3 IMPLANT
SYRINGE 10CC LL (SYRINGE) ×3 IMPLANT
TAPE CLOTH SURG 4X10 WHT LF (GAUZE/BANDAGES/DRESSINGS) ×2 IMPLANT
TOWEL OR 17X24 6PK STRL BLUE (TOWEL DISPOSABLE) ×3 IMPLANT
TOWEL OR 17X26 10 PK STRL BLUE (TOWEL DISPOSABLE) ×3 IMPLANT
WATER STERILE IRR 1000ML POUR (IV SOLUTION) ×3 IMPLANT
WIRE AMPLATZ SS-J .035X180CM (WIRE) IMPLANT

## 2014-03-04 NOTE — Transfer of Care (Signed)
Immediate Anesthesia Transfer of Care Note  Patient: [name]  Procedure(s) Performed: Procedure(s): INSERTION OF DIALYSIS CATHETER (Left)  Patient Location: PACU  Anesthesia Type:MAC  Level of Consciousness: awake, alert , oriented and patient cooperative  Airway & Oxygen Therapy: Patient Spontanous Breathing and Patient connected to face mask oxygen  Post-op Assessment: Report given to PACU RN and Post -op Vital signs reviewed and stable  Post vital signs: Reviewed and stable  Complications: No apparent anesthesia complications

## 2014-03-04 NOTE — Op Note (Signed)
OPERATIVE NOTE  PROCEDURE: 1. Right internal jugular vein temporary dialysis catheter exchange for tunneled dialysis catheter   PRE-OPERATIVE DIAGNOSIS: end-stage renal failure  POST-OPERATIVE DIAGNOSIS: same as above  SURGEON: Adele Barthel, MD  ANESTHESIA: local and MAC  ESTIMATED BLOOD LOSS: 30 cc  FINDING(S): 1.  Tips of the catheter in the right atrium on fluoroscopy 2.  No obvious pneumothorax on fluoroscopy  SPECIMEN(S):  none  INDICATIONS:   [name] is a 79 y.o. female who presents with end stage renal disease.  The patient recently had a temporary dialysis catheter placement.  To facilitate long-term hemodialysis, the patient presents for tunneled dialysis catheter placement or exchange her temporary dialysis catheter for a tunneled one.  The patient is aware the risks of tunneled dialysis catheter placement include but are not limited to: bleeding, infection, central venous injury, pneumothorax, possible venous stenosis, possible malpositioning in the venous system, and possible infections related to long-term catheter presence.  The patient was aware of these risks and agreed to proceed.  DESCRIPTION: After written full informed consent was obtained from the patient, the patient was taken back to the operating room.  Prior to induction, the patient was given IV antibiotics.  After obtaining adequate sedation, the patient was prepped and draped in the standard fashion for a chest or neck tunneled dialysis catheter placement.  I anesthesized the neck cannulation site with local anesthetic.  I transected a port and passed the J-wire down into the right ventricle under fluroscopic guidance.  The wire was then secured in place with a clamp to the drapes.  The cannulation site, the catheter exit site, and tract for the subcutaneous tunnel were then anesthesized with a total of 30 cc of a 1:1 mixture of 1% Lidocaine with epinepherine.  I then made stab incisions at the  prior neck incision to widen it and also at the chest exit site.   I dissected from the exit site to the cannulation site with a metal tunneler.   The subcutaneous tunnel was dilated by passing a plastic dilator over the metal dissector. The wire was then unclamped and I removed the needle.  The skin tract and venotomy was dilated serially with dilators.  Finally, the dilator-sheath was placed under fluroscopic guidance into the superior vena cava.  The dilator and wire were removed.  A 23 cm Diatek catheter was placed under fluoroscopic guidance down into the right atrium.  The sheath was broken and peeled away while holding the catheter cuff at the level of the skin.  The back end of this catheter was transected, revealing the two lumens of this catheter.  The ports were docked onto these two lumens.  The catheter hub was then screwed into place.  Each port was tested by aspirating and flushing.  No resistance was noted.  Each port was then thoroughly flushed with heparinized saline.  The catheter was secured in placed with two interrupted stitches of 3-0 Nylon tied to the catheter.  The neck incision was closed with a U-stitch of 4-0 Monocryl.  The neck and chest incision were cleaned and sterile bandages applied.  Each port was then loaded with concentrated heparin (1000 Units/mL) at the manufacturer recommended volumes to each port.  Sterile caps were applied to each port.  On completion fluoroscopy, the tips of the catheter were in the right atrium, and there was no evidence of pneumothorax.  COMPLICATIONS: none  CONDITION: stable  Adele Barthel, MD [facility]  [place] Office: [phone] Pager: [phone]  03/04/2014, 2:05 PM

## 2014-03-04 NOTE — Anesthesia Postprocedure Evaluation (Signed)
Anesthesia Post Note  Patient: [name]  Procedure(s) Performed: Procedure(s) (LRB): INSERTION OF DIALYSIS CATHETER (Left)  Anesthesia type: MAC  Patient location: PACU  Post pain: Pain level controlled  Post assessment: Patient's Cardiovascular Status Stable  Last Vitals:  Filed Vitals:   03/04/14 1422  BP:   Pulse:   Temp: 36.8 C  Resp:     Post vital signs: Reviewed and stable  Level of consciousness: alert  Complications: No apparent anesthesia complications

## 2014-03-04 NOTE — Anesthesia Preprocedure Evaluation (Signed)
Anesthesia Evaluation  Patient identified by MRN, date of birth, ID band Patient awake    Reviewed: Allergy & Precautions, H&P , NPO status , Patient's Chart, lab work & pertinent test results, reviewed documented beta blocker date and time   Airway Mallampati: II TM Distance: >3 FB Neck ROM: full    Dental   Pulmonary shortness of breath and with exertion, sleep apnea , former smoker,  breath sounds clear to auscultation        Cardiovascular hypertension, + angina + CAD, + Past MI, + Peripheral Vascular Disease and +CHF Rhythm:regular     Neuro/Psych  Headaches, CVA negative neurological ROS  negative psych ROS   GI/Hepatic negative GI ROS, Neg liver ROS,   Endo/Other  negative endocrine ROSdiabetesHypothyroidism   Renal/GU Dialysis and ESRFRenal diseasenegative Renal ROS  negative genitourinary   Musculoskeletal   Abdominal   Peds  Hematology negative hematology ROS (+)   Anesthesia Other Findings See surgeon's H&P   Reproductive/Obstetrics negative OB ROS                           Anesthesia Physical Anesthesia Plan  ASA: III  Anesthesia Plan: MAC   Post-op Pain Management:    Induction: Intravenous  Airway Management Planned: Simple Face Mask  Additional Equipment:   Intra-op Plan:   Post-operative Plan:   Informed Consent: I have reviewed the patients History and Physical, chart, labs and discussed the procedure including the risks, benefits and alternatives for the proposed anesthesia with the patient or authorized representative who has indicated his/her understanding and acceptance.   Dental Advisory Given  Plan Discussed with: CRNA and Surgeon  Anesthesia Plan Comments:         Anesthesia Quick Evaluation

## 2014-03-04 NOTE — Progress Notes (Addendum)
[facility] PROGRESS NOTE  [name] CZY:[id number] DOB: 06-Dec-1934 DOA: 03/01/2014 PCP: Rochel Brome, MD  Brief Summary  [name] is a 79 y.o. female with a past medical history of CKD status post fistula placement and left arm, hypertension, coronary artery disease with stent, peripheral vascular disease. The patient presented to [facility] 2 days prior to admission with complaints of shortness of breath that had been going on for the past 7 days. She was severely [place] of breath with even taking a few steps and had noted a 10 pound weight gain and inability to lay flat, having to sleep in a recliner. She was found to be in moderate respiratory distress and admitted to the step down unit. She was started on IV Lasix for fluid overload. Since Lasix was ineffective in diuresing her, she was sent to [facility] to receive dialysis.  Her fistula is not matured yet and so vascular placed a tunneled HD catheter in the right chest 7/7.  CLIP process started.  Still volume overloaded and getting frequent HD.     Assessment/Plan  Acute on chronic renal failure, deemed ESRD by nephrology.  Possibility of dialysis had been anticipated and she has undergone fistula placement by Dr. Scot Dock with reevaluation by fistulogram a few weeks ago.  She did not respond well to lasix.   -  Temporary HD catheter initially placed by Dr. Jonnie Finner -  Vascular surgery consult:  HD cath placed today right chest.    DM type 2 (diabetes mellitus, type 2), CBG elevated yesterday - increase to lantus 35 units - increase to mod dose SSI - hypoglycemia protocol  HTN (hypertension), BP dropped during HD but still elevated.  Will likely continue to trend down as more fluid removed - continued reduced BP medications for now -  Prn hydralazine  CAD in native artery:  Last cath 11/2011, LAD 50%; mid L Cx lum irregularities; mid RCA with 90% lesion with thrombus s/p stent to mid RCA.  Started at  ticagrelor at that time.    -  Continue Imdur, Ranexa -  Continue ASA -  Hold ticagrelor (recieved AM dose on 7/5) prior to procedure.  Unspecified hypothyroidism  - Continue Synthroid  Gout flare left MTP joint, somewhat improved -  Received Colchicine x 1 -  Avoid NSAIDS unless okay by nephrology -  Avoid prednisone unless colch fails due to DM  Diet:  Renal/diabetic  Access:  PIV, temp HD cath IVF:  None Proph:  heparin  Code Status: full Family Communication: patient alone Disposition Plan:   ESRD and will need to start CLIP process.     Consultants:  Nephrology  Vascular surgery, Dr. Scot Dock  Procedures:  Temp HD cath 7/5 >> 7/7  Tunneled HD cath 7/7 >>  CXR  New HD  Antibiotics:  None   HPI/Subjective:  Hungry and having some pain in her right shoulder and neck from recent procedure.  Denies SOB.  Groggy.  Objective: Filed Vitals:   03/03/14 1900 03/03/14 2201 03/04/14 0624 03/04/14 1000  BP: 144/65 156/51 147/48 170/76  Pulse: 60 82 71 76  Temp: 97.7 F (36.5 C) 98.2 F (36.8 C) 98.5 F (36.9 C) 98 F (36.7 C)  TempSrc: Oral Oral Oral Oral  Resp: 21 18 18 17   Height:      Weight:  91.5 kg (201 lb 11.5 oz)    SpO2: 99% 98% 100% 100%    Intake/Output Summary (Last 24 hours) at 03/04/14 1340 Last  data filed at 03/04/14 0900  Gross per 24 hour  Intake    480 ml  Output   4250 ml  Net  -3770 ml   Filed Weights   03/03/14 1530 03/03/14 1830 03/03/14 2201  Weight: 94.1 kg (207 lb 7.3 oz) 90.1 kg (198 lb 10.2 oz) 91.5 kg (201 lb 11.5 oz)    Exam:   General:  CF, No acute distress  HEENT:  NCAT, MMM  Cardiovascular:  RRR, nl S1, S2 no mrg, 2+ pulses, warm extremities  Respiratory:  CTAB, no increased WOB  Abdomen:   NABS, soft, NT/ND  MSK:   Normal tone and bulk, 1+ bilatearl lower extremity soft pitting edema and 1+ left hand and arm edema (recent duplex and fistulogram neg).  Less pinkness and less TTP of the 1st MTP joint    Neuro:  Grossly intact  Data Reviewed: Basic Metabolic Panel:  Recent Labs Lab 03/01/14 1846 03/02/14 0510 03/02/14 1058  NA  --  139 135*  K  --  4.1 4.7  CL  --  99 96  CO2  --  20 20  GLUCOSE  --  48* 163*  BUN  --  98* 98*  CREATININE 4.74* 4.74* 4.79*  CALCIUM  --  9.1 8.9  PHOS  --   --  7.5*   Liver Function Tests:  Recent Labs Lab 03/02/14 1058  ALBUMIN 2.8*   No results found for this basename: LIPASE, AMYLASE,  in the last 168 hours No results found for this basename: AMMONIA,  in the last 168 hours CBC:  Recent Labs Lab 03/01/14 1846 03/02/14 0510  WBC 9.0 9.3  HGB 9.7* 10.1*  HCT 30.6* 32.3*  MCV 87.2 87.1  PLT 216 245   Cardiac Enzymes: No results found for this basename: CKTOTAL, CKMB, CKMBINDEX, TROPONINI,  in the last 168 hours BNP (last 3 results) No results found for this basename: PROBNP,  in the last 8760 hours CBG:  Recent Labs Lab 03/04/14 0350 03/04/14 0616 03/04/14 0804 03/04/14 1022 03/04/14 1042  GLUCAP 339* 248* 186* 158* 139*    Recent Results (from the past 240 hour(s))  MRSA PCR SCREENING     Status: None   Collection Time    03/01/14  4:36 PM      Result Value Ref Range Status   MRSA by PCR NEGATIVE  NEGATIVE Final   Comment:            The GeneXpert MRSA Assay (FDA     approved for NASAL specimens     only), is one component of a     comprehensive MRSA colonization     surveillance program. It is not     intended to diagnose MRSA     infection nor to guide or     monitor treatment for     MRSA infections.     Studies: No results found.  Scheduled Meds: . [facility] HOLD] aspirin EC  81 mg Oral QPM  . [MAR HOLD] calcitRIOL  0.25 mcg Oral Once per day on Mon Wed Fri  . [facility] HOLD] furosemide  160 mg Intravenous Q6H  . [facility] HOLD] gabapentin  200 mg Oral Daily  . [MAR HOLD] heparin  20 Units/kg Dialysis Once in dialysis  . [MAR HOLD] heparin  5,000 Units Subcutaneous 3 times per day  . [MAR HOLD] insulin  aspart  0-15 Units Subcutaneous TID WC  . [MAR HOLD] insulin aspart  0-5 Units Subcutaneous QHS  . [  MAR HOLD] insulin glargine  35 Units Subcutaneous QHS  . [facility] HOLD] isosorbide mononitrate  120 mg Oral Daily  . [facility] HOLD] levothyroxine  88 mcg Oral QAC breakfast  . [MAR HOLD] metolazone  10 mg Oral Daily  . [facility] HOLD] metoprolol  50 mg Oral BID  . [facility] HOLD] nystatin  1 g Topical TID  . [facility] HOLD] ranolazine  500 mg Oral BID   Continuous Infusions: . sodium chloride 10 mL/hr at 03/04/14 1046    Principal Problem:   Renal failure (ARF), acute on chronic Active Problems:   DM type 2 (diabetes mellitus, type 2)   HTN (hypertension)   CAD in native artery   Unspecified hypothyroidism    Time spent: 30 min    Tiauna Whisnant, [facility] Pager [phone]. If 7PM-7AM, please contact night-coverage at [email or web address], password [email or web address] 03/04/2014, 1:40 PM  LOS: 3 days

## 2014-03-04 NOTE — H&P (Signed)
Brief History and Physical  History of Present Illness  [name] is a 79 y.o. female who presents with chief complaint: ESRD.  The patient presents today for [facility] placement.    Past Medical History  Diagnosis Date  . Hypertension   . Heart failure December 07, 2012    Right side  . Hypercalcemia     2nd to hyperparathyroidism  . Thyroid disease     Hyperparathyroidism  . Thyroid disease     Hypothyroidism  . Dyslipidemia   . CHF (congestive heart failure)   . Family history of anesthesia complication     sister - N/V  . Myocardial infarction 10/28/2010  . Peripheral vascular disease   . Short-term memory loss   . Stroke 2010ish    no residual  . Shingles 2010 ish  . Anginal pain   . Coronary artery disease   . ESRD (end stage renal disease)     Renal insufficiency NOT ON HD  . Diabetes mellitus without complication     TYPE 2  . Shortness of breath   . Parathyroid cyst   . Headache(784.0)     CALCIFICATION LT SKULL  . Arthritis     Gout  . Cancer     kidney left removed  . Sleep apnea     Recently test in [place](NEG) NO CPAP     Past Surgical History  Procedure Laterality Date  . Nephrectomy Left 2005  . Abdominal hysterectomy  2005  . Cholecystectomy  2005  . Cornary stent    . Coronary stent placement Left 10/2010  . Av fistula placement Left 01/09/2013    Procedure: ARTERIOVENOUS (AV) FISTULA CREATION;  Surgeon: Mal Misty, MD;  Location: [place];  Service: Vascular;  Laterality: Left;  Creation of Left Brachial Cephalic Arteriovenous Fistula  . Bascilic vein transposition Left 07/30/2013    Procedure: 1ST Stage BASCILIC VEIN TRANSPOSITION;  Surgeon: Angelia Mould, MD;  Location: [place];  Service: Vascular;  Laterality: Left;  . Cardiac catheterization      2013 STENT PLACED  . Cataract extraction      BILAT  . Bascilic vein transposition Left 12/17/2013    Procedure: BASILIC VEIN TRANSPOSITION - LEFT ARM 2ND STAGE;  Surgeon:  Angelia Mould, MD;  Location: [place];  Service: Vascular;  Laterality: Left;    History   Social History  . Marital Status: Widowed    Spouse Name: N/A    Number of Children: N/A  . Years of Education: N/A   Occupational History  . Not on file.   Social History Main Topics  . Smoking status: Former Smoker -- 46 years    Types: Cigarettes    Quit date: 01/08/1998  . Smokeless tobacco: Never Used  . Alcohol Use: No  . Drug Use: No  . Sexual Activity: Not on file   Other Topics Concern  . Not on file   Social History Narrative  . No narrative on file    Family History  Problem Relation Age of Onset  . Diabetes Sister   . Heart disease Sister     before age 13  . Hyperlipidemia Sister   . Hypertension Sister   . Diabetes Sister   . Hyperlipidemia Sister   . Hypertension Sister     No current facility-administered medications on file prior to encounter.   Current Outpatient Prescriptions on File Prior to Encounter  Medication Sig Dispense Refill  . acetaminophen (TYLENOL)  500 MG tablet Take 1,000 mg by mouth 3 (three) times daily as needed for mild pain.       Marland Kitchen albuterol (PROVENTIL HFA;VENTOLIN HFA) 108 (90 BASE) MCG/ACT inhaler Inhale 2 puffs into the lungs every 6 (six) hours as needed for wheezing or shortness of breath.      Marland Kitchen albuterol (PROVENTIL) (2.5 MG/3ML) 0.083% nebulizer solution Take 2.5 mg by nebulization every 6 (six) hours as needed for wheezing or shortness of breath.       Marland Kitchen amLODipine (NORVASC) 5 MG tablet Take 5 mg by mouth daily.      Marland Kitchen aspirin EC 81 MG tablet Take 81 mg by mouth every evening.      . calcitRIOL (ROCALTROL) 0.25 MCG capsule Take 0.25 mcg by mouth 3 (three) times a week. Sunday, Wednesday, and Friday      . furosemide (LASIX) 40 MG tablet Take 40 mg by mouth daily.      Marland Kitchen gabapentin (NEURONTIN) 400 MG capsule Take 400 mg by mouth 3 (three) times daily.      . insulin aspart (NOVOLOG FLEXPEN) 100 UNIT/ML FlexPen Inject 9-22  Units into the skin 3 (three) times daily with meals. Sliding scale      . insulin glargine (LANTUS) 100 UNIT/ML injection Inject 50 Units into the skin at bedtime.       . isosorbide mononitrate (IMDUR) 60 MG 24 hr tablet Take 120 mg by mouth daily.       . metolazone (ZAROXOLYN) 5 MG tablet Take 5 mg by mouth every other day.      . nitroGLYCERIN (NITROSTAT) 0.4 MG SL tablet Place 0.4 mg under the tongue every 5 (five) minutes as needed for chest pain.      . Omega-3 Fatty Acids (FISH OIL) 1000 MG CAPS Take 1,000 mg by mouth 2 (two) times daily.      Marland Kitchen oxyCODONE (ROXICODONE) 5 MG immediate release tablet Take 1 tablet (5 mg total) by mouth every 6 (six) hours as needed for severe pain.  30 tablet  0  . pravastatin (PRAVACHOL) 20 MG tablet Take 10 mg by mouth daily.       . ranolazine (RANEXA) 500 MG 12 hr tablet Take 500 mg by mouth 2 (two) times daily.      . Ticagrelor (BRILINTA) 90 MG TABS tablet Take 1 tablet (90 mg total) by mouth 2 (two) times daily.  60 tablet      Allergies  Allergen Reactions  . Macrobid [Nitrofurantoin Macrocrystal] Anaphylaxis and Rash  . Ace Inhibitors Other (See Comments)    hyperkalemia  . Angiotensin Receptor Blockers Other (See Comments)    hyperkalemia  . Influenza Vaccines Itching, Rash and Other (See Comments)    blisters    Review of Systems: As listed above, otherwise negative.  Physical Examination  Filed Vitals:   03/03/14 1830 03/03/14 1900 03/03/14 2201 03/04/14 0624  BP: 157/65 144/65 156/51 147/48  Pulse: 78 60 82 71  Temp: 97.9 F (36.6 C) 97.7 F (36.5 C) 98.2 F (36.8 C) 98.5 F (36.9 C)  TempSrc: Oral Oral Oral Oral  Resp: 20 21 18 18   Height:      Weight: 198 lb 10.2 oz (90.1 kg)  201 lb 11.5 oz (91.5 kg)   SpO2: 98% 99% 98% 100%    General: A&O x 3, WDWN  Pulmonary: Sym exp, good air movt, CTAB, no rales, rhonchi, & wheezing  Cardiac: RRR, Nl S1, S2, no Murmurs, rubs or gallops  Gastrointestinal: soft, NTND, -G/R, -  HSM, - masses, - CVAT B  Musculoskeletal: M/S 5/5 throughout , Extremities without ischemic changes   Laboratory: CBC:    Component Value Date/Time   WBC 9.3 03/02/2014 0510   RBC 3.71* 03/02/2014 0510   HGB 10.1* 03/02/2014 0510   HCT 32.3* 03/02/2014 0510   PLT 245 03/02/2014 0510   MCV 87.1 03/02/2014 0510   MCH 27.2 03/02/2014 0510   MCHC 31.3 03/02/2014 0510   RDW 15.6* 03/02/2014 0510    BMP:    Component Value Date/Time   NA 135* 03/02/2014 1058   K 4.7 03/02/2014 1058   CL 96 03/02/2014 1058   CO2 20 03/02/2014 1058   GLUCOSE 163* 03/02/2014 1058   BUN 98* 03/02/2014 1058   CREATININE 4.79* 03/02/2014 1058   CALCIUM 8.9 03/02/2014 1058   GFRNONAA 8* 03/02/2014 1058   GFRAA 9* 03/02/2014 1058    Coagulation: Lab Results  Component Value Date   INR 0.94 01/09/2013   No results found for this basename: PTT    Medical Decision Making  NEGAR SIELER is a 79 y.o. female who presents with: ESRD.   The patient is scheduled for: [facility] placement The patient is aware the risks of tunneled dialysis catheter placement include but are not limited to: bleeding, infection, central venous injury, pneumothorax, possible venous stenosis, possible malpositioning in the venous system, and possible infections related to long-term catheter presence.   The patient is aware of the risks and agrees to proceed.  Adele Barthel, MD [facility] Office: [phone] Pager: [phone]  03/04/2014, 7:03 AM

## 2014-03-04 NOTE — Progress Notes (Signed)
Patient ID: [name], female   DOB: 01-30-1935, 79 y.o.   MRN: [id number]   [facility] Progress Note   Assessment/ Plan:   1 ESRD: New start to HD-TDC placed earlier today by [facility], CLIP process started for OP HD unit placement  2 Volume excess / pulm edema- improving with HD/UF- will continue to work towards her EDW  3 HTN reduced BP meds for now  4 Anemia: check iron stores and start ESA  5 HPTH on vit D  6 DM2 on insulin  7 Access - LUA BVT 2nd stage April and planned for [facility] f/u in August (Dr.Dickson suspected problems with inflow but angiogram showed patency)  Subjective:   Underwent TDC placement earlier today. Reports she is tired   Objective:   BP 147/68  Pulse 67  Temp(Src) 97.7 F (36.5 C) (Oral)  Resp 16  Ht 5\' 1"  (1.549 m)  Wt 91.5 kg (201 lb 11.5 oz)  BMI 38.13 kg/m2  SpO2 97%  Physical Exam: Gen: Somnolent s/p TDC placement URK:YHCWC RRR, normal S1 and S2 Resp:Decreased BS over bases, otherwise CTA bilaterally BJS:EGBT, obese, NT, BS normal Ext:2+ LE edema  Labs: BMET  Recent Labs Lab 03/01/14 1846 03/02/14 0510 03/02/14 1058  NA  --  139 135*  K  --  4.1 4.7  CL  --  99 96  CO2  --  20 20  GLUCOSE  --  48* 163*  BUN  --  98* 98*  CREATININE 4.74* 4.74* 4.79*  CALCIUM  --  9.1 8.9  PHOS  --   --  7.5*   CBC  Recent Labs Lab 03/01/14 1846 03/02/14 0510  WBC 9.0 9.3  HGB 9.7* 10.1*  HCT 30.6* 32.3*  MCV 87.2 87.1  PLT 216 245   Medications:    . [facility] HOLD] aspirin EC  81 mg Oral QPM  . [MAR HOLD] calcitRIOL  0.25 mcg Oral Once per day on Mon Wed Fri  . [facility] HOLD] furosemide  160 mg Intravenous Q6H  . [facility] HOLD] gabapentin  200 mg Oral Daily  . [MAR HOLD] heparin  20 Units/kg Dialysis Once in dialysis  . [MAR HOLD] heparin  5,000 Units Subcutaneous 3 times per day  . [MAR HOLD] insulin aspart  0-15 Units Subcutaneous TID WC  . [MAR HOLD] insulin aspart  0-5 Units Subcutaneous QHS  . [MAR HOLD] insulin  glargine  35 Units Subcutaneous QHS  . [facility] HOLD] isosorbide mononitrate  120 mg Oral Daily  . [facility] HOLD] levothyroxine  88 mcg Oral QAC breakfast  . [MAR HOLD] metolazone  10 mg Oral Daily  . [facility] HOLD] metoprolol  50 mg Oral BID  . [facility] HOLD] nystatin  1 g Topical TID  . [facility] HOLD] ranolazine  500 mg Oral BID   Elmarie Shiley, MD 03/04/2014, 4:28 PM

## 2014-03-05 ENCOUNTER — Encounter (HOSPITAL_COMMUNITY): Payer: Self-pay | Admitting: Vascular Surgery

## 2014-03-05 DIAGNOSIS — N058 Unspecified nephritic syndrome with other morphologic changes: Secondary | ICD-10-CM

## 2014-03-05 LAB — CBC
HCT: 30.5 % — ABNORMAL LOW (ref 36.0–46.0)
HEMOGLOBIN: 9.8 g/dL — AB (ref 12.0–15.0)
MCH: 27.7 pg (ref 26.0–34.0)
MCHC: 32.1 g/dL (ref 30.0–36.0)
MCV: 86.2 fL (ref 78.0–100.0)
PLATELETS: 229 10*3/uL (ref 150–400)
RBC: 3.54 MIL/uL — AB (ref 3.87–5.11)
RDW: 14.5 % (ref 11.5–15.5)
WBC: 8.4 10*3/uL (ref 4.0–10.5)

## 2014-03-05 LAB — FERRITIN: Ferritin: 170 ng/mL (ref 10–291)

## 2014-03-05 LAB — GLUCOSE, CAPILLARY
GLUCOSE-CAPILLARY: 170 mg/dL — AB (ref 70–99)
Glucose-Capillary: 216 mg/dL — ABNORMAL HIGH (ref 70–99)
Glucose-Capillary: 155 mg/dL — ABNORMAL HIGH (ref 70–99)
Glucose-Capillary: 132 mg/dL — ABNORMAL HIGH (ref 70–99)

## 2014-03-05 LAB — IRON AND TIBC
Iron: 43 ug/dL (ref 42–135)
SATURATION RATIOS: 18 % — AB (ref 20–55)
TIBC: 233 ug/dL — ABNORMAL LOW (ref 250–470)
UIBC: 190 ug/dL (ref 125–400)

## 2014-03-05 LAB — RENAL FUNCTION PANEL
ALBUMIN: 2.7 g/dL — AB (ref 3.5–5.2)
Anion gap: 18 — ABNORMAL HIGH (ref 5–15)
BUN: 56 mg/dL — ABNORMAL HIGH (ref 6–23)
CO2: 25 mEq/L (ref 19–32)
CREATININE: 4.1 mg/dL — AB (ref 0.50–1.10)
Calcium: 9.1 mg/dL (ref 8.4–10.5)
Chloride: 90 mEq/L — ABNORMAL LOW (ref 96–112)
GFR calc Af Amer: 11 mL/min — ABNORMAL LOW (ref >90)
GFR calc non Af Amer: 10 mL/min — ABNORMAL LOW (ref >90)
Glucose, Bld: 225 mg/dL — ABNORMAL HIGH (ref 70–99)
PHOSPHORUS: 6.1 mg/dL — AB (ref 2.3–4.6)
POTASSIUM: 3.8 meq/L (ref 3.7–5.3)
SODIUM: 133 meq/L — AB (ref 137–147)

## 2014-03-05 MED ORDER — DARBEPOETIN ALFA-POLYSORBATE 60 MCG/0.3ML IJ SOLN
INTRAMUSCULAR | Status: AC
  Filled 2014-03-05: qty 0.3

## 2014-03-05 MED ORDER — DARBEPOETIN ALFA-POLYSORBATE 60 MCG/0.3ML IJ SOLN
60.0000 ug | INTRAMUSCULAR | Status: DC
  Administered 2014-03-05: 60 ug via INTRAVENOUS
  Filled 2014-03-05: qty 0.3

## 2014-03-05 NOTE — Plan of Care (Signed)
Problem: Food- and Nutrition-Related Knowledge Deficit (NB-1.1) Goal: Nutrition education Formal process to instruct or train a patient/client in a skill or to impart knowledge to help patients/clients voluntarily manage or modify food choices and eating behavior to maintain or improve health. Outcome: Completed/Met Date Met:  03/05/14 Nutrition Education Note  RD consulted for Renal Education. Provided Choose-A-Meal Booklet to patient. Reviewed food groups and provided written recommended serving sizes specifically determined for patient's current nutritional status.   Explained why diet restrictions are needed and provided lists of foods to limit/avoid that are high potassium, sodium, and phosphorus. Provided specific recommendations on safer alternatives of these foods. Strongly encouraged compliance of this diet.   Discussed importance of protein intake at each meal and snack. Provided examples of how to maximize protein intake throughout the day. Discussed need for fluid restriction with dialysis, importance of minimizing weight gain between HD treatments, and renal-friendly beverage options.  Encouraged pt to discuss specific diet questions/concerns with RD at HD outpatient facility. Teach back method used.  Expect good compliance.  Body mass index is 37.85 kg/(m^2). Pt meets criteria for Obese Class II based on current BMI.  Current diet order is Renal, patient is consuming approximately >50% of meals at this time. Labs and medications reviewed. No further nutrition interventions warranted at this time. RD contact information provided. If additional nutrition issues arise, please re-consult RD.    MS, RD, LDN Inpatient Registered Dietitian Pager: [phone] After-hours pager: [phone]         

## 2014-03-05 NOTE — Progress Notes (Signed)
Patient ID: [name], female   DOB: [birth year], 79 y.o.   MRN: [id number]  [facility] Progress Note   Assessment/ Plan:   1 ESRD: New start to HD-TDC placed yesterday by [facility] (poorly maturing left upper arm second stage DVT, awaits outpatient followup in August to assess this further), CLIP process started for OP HD unit placement  2 Volume excess / pulm edema- improving with HD/UF- will continue to work towards her EDW  3 HTN reduced BP meds for now  4 Anemia: check iron stores and start ESA  5 HPTH on vit D  6 DM2 on insulin    Subjective:   Reports to be feeling fair-inquires about when she will get to go home/to outpatient dialysis    Objective:   BP 131/65  Pulse 89  Temp(Src) 97.4 F (36.3 C) (Oral)  Resp 16  Ht 5\' 1"  (1.549 m)  Wt 90.81 kg (200 lb 3.2 oz)  BMI 37.85 kg/m2  SpO2 96%  Physical Exam: Gen: Comfortably resting in bed, watching television CVS: Pulse regular in rate and rhythm, normal S1 and S2. Right IJ tunneled dialysis catheter Resp: Decreased breath sounds over bases otherwise clear to auscultation Abd: Soft, obese, nontender Ext:1+ lower extremity edema  Labs: BMET  Recent Labs Lab 03/01/14 1846 03/02/14 0510 03/02/14 1058  NA  --  139 135*  K  --  4.1 4.7  CL  --  99 96  CO2  --  20 20  GLUCOSE  --  48* 163*  BUN  --  98* 98*  CREATININE 4.74* 4.74* 4.79*  CALCIUM  --  9.1 8.9  PHOS  --   --  7.5*   CBC  Recent Labs Lab 03/01/14 1846 03/02/14 0510  WBC 9.0 9.3  HGB 9.7* 10.1*  HCT 30.6* 32.3*  MCV 87.2 87.1  PLT 216 245   Medications:    . aspirin EC  81 mg Oral QPM  . calcitRIOL  0.25 mcg Oral Once per day on Mon Wed Fri  . furosemide  160 mg Intravenous Q6H  . gabapentin  200 mg Oral Daily  . heparin  20 Units/kg Dialysis Once in dialysis  . heparin  5,000 Units Subcutaneous 3 times per day  . insulin aspart  0-15 Units Subcutaneous TID WC  . insulin aspart  0-5 Units Subcutaneous QHS  . insulin  glargine  35 Units Subcutaneous QHS  . isosorbide mononitrate  120 mg Oral Daily  . levothyroxine  88 mcg Oral QAC breakfast  . metolazone  10 mg Oral Daily  . metoprolol  50 mg Oral BID  . nystatin  1 g Topical TID  . ranolazine  500 mg Oral BID   Elmarie Shiley, MD 03/05/2014, 11:50 AM

## 2014-03-05 NOTE — Procedures (Signed)
Patient seen on Hemodialysis. QB 350, UF goal 3.5L Treatment adjusted as needed.  Elmarie Shiley MD [facility] Office # [phone] Pager # [phone] 2:23 PM

## 2014-03-05 NOTE — Progress Notes (Signed)
[facility] PROGRESS NOTE  MAKAYLYNN BONILLAS EVO:[id number] DOB: [birth year] DOA: 03/01/2014 PCP: Rochel Brome, MD  Brief Summary  [name] is a 79 y.o. female with a past medical history of CKD status post fistula placement and left arm, hypertension, coronary artery disease with stent, peripheral vascular disease. The patient presented to [facility] 2 days prior to admission with complaints of shortness of breath that had been going on for the past 7 days. She was severely short of breath with even taking a few steps and had noted a 10 pound weight gain and inability to lay flat, having to sleep in a recliner. She was found to be in moderate respiratory distress and admitted to the step down unit. She was started on IV Lasix for fluid overload. Since Lasix was ineffective in diuresing her, she was sent to [facility] to receive dialysis.  Her fistula is not matured yet and so vascular placed a tunneled HD catheter in the right chest 7/7.  CLIP process started.  Still volume overloaded and getting frequent HD.     Assessment/Plan  Acute on chronic renal failure, deemed ESRD by nephrology.  Possibility of dialysis had been anticipated and she has undergone fistula placement by Dr. Scot Dock with reevaluation by fistulogram a few weeks ago.  She did not respond well to lasix.   -  Temporary HD catheter initially placed by Dr. Jonnie Finner -  Vascular surgery consult:  HD cath placed today right chest.    DM type 2 (diabetes mellitus, type 2), CBG elevated yesterday CBG (last 3)   Recent Labs  03/05/14 0741 03/05/14 1126 03/05/14 1700  GLUCAP 155* 170* 132*     Resume lantus 35 units - increase to mod dose SSI - hypoglycemia protocol  HTN (hypertension), BP dropped during HD but still elevated.  Will likely continue to trend down as more fluid removed - continued reduced BP medications for now -  Prn hydralazine  CAD in native artery:  Last cath 11/2011, LAD 50%;  mid L Cx lum irregularities; mid RCA with 90% lesion with thrombus s/p stent to mid RCA.  Started at ticagrelor at that time.    -  Continue Imdur, Ranexa -  Continue ASA -  Hold ticagrelor (recieved AM dose on 7/5) prior to procedure.  Unspecified hypothyroidism  - Continue Synthroid  Gout flare left MTP joint, somewhat improved -  Received Colchicine x 1 -  Avoid NSAIDS unless okay by nephrology -  Avoid prednisone unless colch fails due to DM  Diet:  Renal/diabetic  Access:  PIV, temp HD cath IVF:  None Proph:  heparin  Code Status: full Family Communication: patient alone Disposition Plan:   ESRD and will need to start CLIP process.     Consultants:  Nephrology  Vascular surgery, Dr. Scot Dock  Procedures:  Temp HD cath 7/5 >> 7/7  Tunneled HD cath 7/7 >>  CXR  New HD  Antibiotics:  None   HPI/Subjective:  Comfortable today. Wants to know when she can go home.   Objective: Filed Vitals:   03/05/14 1530 03/05/14 1600 03/05/14 1630 03/05/14 1646  BP: 149/71 147/57 136/52 145/76  Pulse: 75 75 57 82  Temp:    97.4 F (36.3 C)  TempSrc:    Oral  Resp:    18  Height:      Weight:      SpO2:    94%    Intake/Output Summary (Last 24 hours) at 03/05/14 1721 Last data  filed at 03/05/14 1646  Gross per 24 hour  Intake 6244.33 ml  Output   3225 ml  Net 3019.33 ml   Filed Weights   03/03/14 2201 03/04/14 2153 03/05/14 1340  Weight: 91.5 kg (201 lb 11.5 oz) 90.81 kg (200 lb 3.2 oz) 91.8 kg (202 lb 6.1 oz)    Exam:   General:  CF, No acute distress  HEENT:  NCAT, MMM  Cardiovascular:  RRR, nl S1, S2 no mrg, 2+ pulses, warm extremities  Respiratory:  CTAB, no increased WOB  Abdomen:   NABS, soft, NT/ND  MSK:   Normal tone and bulk, 1+ bilatearl lower extremity soft pitting edema and 1+ left hand and arm edema (recent duplex and fistulogram neg).  Less pinkness and less TTP of the 1st MTP joint   Neuro:  Grossly intact  Data Reviewed: Basic  Metabolic Panel:  Recent Labs Lab 03/01/14 1846 03/02/14 0510 03/02/14 1058 03/05/14 1352  NA  --  139 135* 133*  K  --  4.1 4.7 3.8  CL  --  99 96 90*  CO2  --  20 20 25   GLUCOSE  --  48* 163* 225*  BUN  --  98* 98* 56*  CREATININE 4.74* 4.74* 4.79* 4.10*  CALCIUM  --  9.1 8.9 9.1  PHOS  --   --  7.5* 6.1*   Liver Function Tests:  Recent Labs Lab 03/02/14 1058 03/05/14 1352  ALBUMIN 2.8* 2.7*   No results found for this basename: LIPASE, AMYLASE,  in the last 168 hours No results found for this basename: AMMONIA,  in the last 168 hours CBC:  Recent Labs Lab 03/01/14 1846 03/02/14 0510 03/05/14 1351  WBC 9.0 9.3 8.4  HGB 9.7* 10.1* 9.8*  HCT 30.6* 32.3* 30.5*  MCV 87.2 87.1 86.2  PLT 216 245 229   Cardiac Enzymes: No results found for this basename: CKTOTAL, CKMB, CKMBINDEX, TROPONINI,  in the last 168 hours BNP (last 3 results) No results found for this basename: PROBNP,  in the last 8760 hours CBG:  Recent Labs Lab 03/04/14 1633 03/04/14 2149 03/05/14 0741 03/05/14 1126 03/05/14 1700  GLUCAP 167* 216* 155* 170* 132*    Recent Results (from the past 240 hour(s))  MRSA PCR SCREENING     Status: None   Collection Time    03/01/14  4:36 PM      Result Value Ref Range Status   MRSA by PCR NEGATIVE  NEGATIVE Final   Comment:            The GeneXpert MRSA Assay (FDA     approved for NASAL specimens     only), is one component of a     comprehensive MRSA colonization     surveillance program. It is not     intended to diagnose MRSA     infection nor to guide or     monitor treatment for     MRSA infections.     Studies: Dg Chest Port 1 View  03/04/2014   CLINICAL DATA:  Status post diet tech catheter insertion  EXAM: PORTABLE CHEST - 1 VIEW  COMPARISON:  Portable chest x-ray of March 02, 2014  FINDINGS: There has been interval removal of the previous catheter with placement of the diatek catheter. There is no postprocedure pneumothorax. The tip of  the catheter lies in the cavoatrial junction. The cardiac silhouette is mildly enlarged but stable. The central pulmonary vascularity is prominent. The interstitial markings remain mildly  increased. The left hemidiaphragm remains obscured.  IMPRESSION: There is no immediate postprocedure complication following placement of the diet tech catheter. There is stable low-grade CHF.   Electronically Signed   By: David  Martinique   On: 03/04/2014 14:50   Dg Fluoro Guide Cv Line-no Report  03/04/2014   CLINICAL DATA: hemodialysis access   FLOURO GUIDE CV LINE  Fluoroscopy was utilized by the requesting physician.  No radiographic  interpretation.     Scheduled Meds: . aspirin EC  81 mg Oral QPM  . calcitRIOL  0.25 mcg Oral Once per day on Mon Wed Fri  . darbepoetin      . darbepoetin (ARANESP) injection - DIALYSIS  60 mcg Intravenous Q Wed-HD  . furosemide  160 mg Intravenous Q6H  . gabapentin  200 mg Oral Daily  . heparin  20 Units/kg Dialysis Once in dialysis  . heparin  5,000 Units Subcutaneous 3 times per day  . insulin aspart  0-15 Units Subcutaneous TID WC  . insulin aspart  0-5 Units Subcutaneous QHS  . insulin glargine  35 Units Subcutaneous QHS  . isosorbide mononitrate  120 mg Oral Daily  . levothyroxine  88 mcg Oral QAC breakfast  . metolazone  10 mg Oral Daily  . metoprolol  50 mg Oral BID  . nystatin  1 g Topical TID  . ranolazine  500 mg Oral BID   Continuous Infusions: . sodium chloride 10 mL/hr (03/05/14 0359)    Principal Problem:   Renal failure (ARF), acute on chronic Active Problems:   DM type 2 (diabetes mellitus, type 2)   HTN (hypertension)   CAD in native artery   Unspecified hypothyroidism    Time spent: 20 min    [facility] Pager [phone]. If 7PM-7AM, please contact night-coverage at [email or web address], password [facility] 03/05/2014, 5:21 PM  LOS: 4 days

## 2014-03-06 LAB — GLUCOSE, CAPILLARY
GLUCOSE-CAPILLARY: 197 mg/dL — AB (ref 70–99)
GLUCOSE-CAPILLARY: 336 mg/dL — AB (ref 70–99)
GLUCOSE-CAPILLARY: 313 mg/dL — AB (ref 70–99)
Glucose-Capillary: 390 mg/dL — ABNORMAL HIGH (ref 70–99)
Glucose-Capillary: 294 mg/dL — ABNORMAL HIGH (ref 70–99)

## 2014-03-06 NOTE — Progress Notes (Signed)
[facility] PROGRESS NOTE  [name] EXH:[id number] DOB: 05-29-1935 DOA: 03/01/2014 PCP: Rochel Brome, MD  Brief Summary  [name] is a 79 y.o. female with a past medical history of CKD status post fistula placement and left arm, hypertension, coronary artery disease with stent, peripheral vascular disease. The patient presented to [facility] 2 days prior to admission with complaints of shortness of breath that had been going on for the past 7 days. She was severely short of breath with even taking a few steps and had noted a 10 pound weight gain and inability to lay flat, having to sleep in a recliner. She was found to be in moderate respiratory distress and admitted to the step down unit. She was started on IV Lasix for fluid overload. Since Lasix was ineffective in diuresing her, she was sent to [facility] to receive dialysis.  Her fistula is not matured yet and so vascular placed a tunneled HD catheter in the right chest 7/7.  CLIP process started.  Still volume overloaded and getting frequent HD.     Assessment/Plan  Acute on chronic renal failure, deemed ESRD by nephrology.  Possibility of dialysis had been anticipated and she has undergone fistula placement by Dr. Scot Dock with reevaluation by fistulogram a few weeks ago.  She did not respond well to lasix.   -  Temporary HD catheter initially placed by Dr. Jonnie Finner -  Vascular surgery consult:  HD cath placed today right chest.    DM type 2 (diabetes mellitus, type 2), CBG elevated yesterday CBG (last 3)   Recent Labs  03/06/14 0737 03/06/14 1127 03/06/14 1703  GLUCAP 294* 197* 336*     Resume lantus 35 units - increase to mod dose SSI - hypoglycemia protocol  HTN (hypertension), BP dropped during HD but still elevated.  Will likely continue to trend down as more fluid removed - continued reduced BP medications for now -  Prn hydralazine  CAD in native artery:  Last cath 11/2011, LAD 50%;  mid L Cx lum irregularities; mid RCA with 90% lesion with thrombus s/p stent to mid RCA.  Started at ticagrelor at that time.    -  Continue Imdur, Ranexa -  Continue ASA -  Hold ticagrelor (recieved AM dose on 7/5) prior to procedure.  Unspecified hypothyroidism  - Continue Synthroid  Gout flare left MTP joint, somewhat improved -  Received Colchicine x 1 -  Avoid NSAIDS unless okay by nephrology -  Avoid prednisone unless colch fails due to DM  Diet:  Renal/diabetic  Access:  PIV, temp HD cath IVF:  None Proph:  heparin  Code Status: full Family Communication: patient alone Disposition Plan:   On Saturday after HD.    Consultants:  Nephrology  Vascular surgery, Dr. Scot Dock  Procedures:  Temp HD cath 7/5 >> 7/7  Tunneled HD cath 7/7 >>  CXR  New HD  Antibiotics:  None   HPI/Subjective:  Comfortable today. Wants to know when she can go home.   Objective: Filed Vitals:   03/05/14 2033 03/05/14 2236 03/06/14 0411 03/06/14 1016  BP: 129/67  128/68 132/67  Pulse: 64  63 66  Temp: 98 F (36.7 C)  97.8 F (36.6 C) 97.5 F (36.4 C)  TempSrc: Oral  Oral Oral  Resp: 18  18 18   Height:      Weight: 89.313 kg (196 lb 14.4 oz)     SpO2: 98% 99% 96% 95%    Intake/Output Summary (Last 24  hours) at 03/06/14 1823 Last data filed at 03/06/14 1440  Gross per 24 hour  Intake   1704 ml  Output    300 ml  Net   1404 ml   Filed Weights   03/04/14 2153 03/05/14 1340 03/05/14 2033  Weight: 90.81 kg (200 lb 3.2 oz) 91.8 kg (202 lb 6.1 oz) 89.313 kg (196 lb 14.4 oz)    Exam:   General:  CF, No acute distress  HEENT:  NCAT, MMM  Cardiovascular:  RRR, nl S1, S2 no mrg, 2+ pulses, warm extremities  Respiratory:  CTAB, no increased WOB  Abdomen:   NABS, soft, NT/ND  MSK:   Normal tone and bulk, 1+ bilatearl lower extremity soft pitting edema and 1+ left hand and arm edema (recent duplex and fistulogram neg).  Less pinkness and less TTP of the 1st MTP joint    Neuro:  Grossly intact  Data Reviewed: Basic Metabolic Panel:  Recent Labs Lab 03/01/14 1846 03/02/14 0510 03/02/14 1058 03/05/14 1352  NA  --  139 135* 133*  K  --  4.1 4.7 3.8  CL  --  99 96 90*  CO2  --  20 20 25   GLUCOSE  --  48* 163* 225*  BUN  --  98* 98* 56*  CREATININE 4.74* 4.74* 4.79* 4.10*  CALCIUM  --  9.1 8.9 9.1  PHOS  --   --  7.5* 6.1*   Liver Function Tests:  Recent Labs Lab 03/02/14 1058 03/05/14 1352  ALBUMIN 2.8* 2.7*   No results found for this basename: LIPASE, AMYLASE,  in the last 168 hours No results found for this basename: AMMONIA,  in the last 168 hours CBC:  Recent Labs Lab 03/01/14 1846 03/02/14 0510 03/05/14 1351  WBC 9.0 9.3 8.4  HGB 9.7* 10.1* 9.8*  HCT 30.6* 32.3* 30.5*  MCV 87.2 87.1 86.2  PLT 216 245 229   Cardiac Enzymes: No results found for this basename: CKTOTAL, CKMB, CKMBINDEX, TROPONINI,  in the last 168 hours BNP (last 3 results) No results found for this basename: PROBNP,  in the last 8760 hours CBG:  Recent Labs Lab 03/05/14 1700 03/05/14 2142 03/06/14 0737 03/06/14 1127 03/06/14 1703  GLUCAP 132* 390* 294* 197* 336*    Recent Results (from the past 240 hour(s))  MRSA PCR SCREENING     Status: None   Collection Time    03/01/14  4:36 PM      Result Value Ref Range Status   MRSA by PCR NEGATIVE  NEGATIVE Final   Comment:            The GeneXpert MRSA Assay (FDA     approved for NASAL specimens     only), is one component of a     comprehensive MRSA colonization     surveillance program. It is not     intended to diagnose MRSA     infection nor to guide or     monitor treatment for     MRSA infections.     Studies: No results found.  Scheduled Meds: . aspirin EC  81 mg Oral QPM  . calcitRIOL  0.25 mcg Oral Once per day on Mon Wed Fri  . darbepoetin (ARANESP) injection - DIALYSIS  60 mcg Intravenous Q Wed-HD  . furosemide  160 mg Intravenous Q6H  . gabapentin  200 mg Oral Daily  .  heparin  20 Units/kg Dialysis Once in dialysis  . heparin  5,000 Units Subcutaneous 3  times per day  . insulin aspart  0-15 Units Subcutaneous TID WC  . insulin aspart  0-5 Units Subcutaneous QHS  . insulin glargine  35 Units Subcutaneous QHS  . isosorbide mononitrate  120 mg Oral Daily  . levothyroxine  88 mcg Oral QAC breakfast  . metolazone  10 mg Oral Daily  . metoprolol  50 mg Oral BID  . nystatin  1 g Topical TID  . ranolazine  500 mg Oral BID   Continuous Infusions: . sodium chloride 10 mL/hr (03/05/14 0359)    Principal Problem:   Renal failure (ARF), acute on chronic Active Problems:   DM type 2 (diabetes mellitus, type 2)   HTN (hypertension)   CAD in native artery   Unspecified hypothyroidism    Time spent: 20 min    [facility] Pager [phone]. If 7PM-7AM, please contact night-coverage at [email or web address], password [facility] 03/06/2014, 6:23 PM  LOS: 5 days

## 2014-03-06 NOTE — Progress Notes (Signed)
Patient ID: [name], female   DOB: Dec 07, 1934, 79 y.o.   MRN: [id number]  [facility] Progress Note   Assessment/ Plan:   1 ESRD: New start to HD-TDC placed by VVS (poorly maturing left upper arm second stage DVT, awaits outpatient followup in August to assess this further). She has been placed at the [facility] kidney [facility] to start dialysis on July 14 on a Tuesday/Thursday/Saturday schedule. The plan is to have her dialyzed here on Saturday prior to discharge to go and do paperwork at the Center on Monday. She was on a M./W./F schedule here at the hospital and I will skip dialysis tomorrow to get her to her outpatient schedule 2 Volume excess / pulm edema- improving with HD/UF- will continue to work towards her EDW  3 HTN reduced BP meds for now  4 Anemia: check iron stores and start ESA  5 HPTH on vit D  6 DM2 on insulin    Subjective:   Reports to be comfortable-she is agreeable with the plan of discharge on Saturday after hemodialysis.    Objective:   BP 132/67  Pulse 66  Temp(Src) 97.5 F (36.4 C) (Oral)  Resp 18  Ht 5\' 1"  (1.549 m)  Wt 89.313 kg (196 lb 14.4 oz)  BMI 37.22 kg/m2  SpO2 95%  Physical Exam: Gen: Comfortably sitting up in a recliner CVS: Pulse regular in rate and rhythm, S1 and S2 normal Resp: Clear to auscultation bilaterally, no rales/rhonchi Abd: Soft, flat, nontender Ext: One plus lower extremity edema  Labs: BMET  Recent Labs Lab 03/01/14 1846 03/02/14 0510 03/02/14 1058 03/05/14 1352  NA  --  139 135* 133*  K  --  4.1 4.7 3.8  CL  --  99 96 90*  CO2  --  20 20 25   GLUCOSE  --  48* 163* 225*  BUN  --  98* 98* 56*  CREATININE 4.74* 4.74* 4.79* 4.10*  CALCIUM  --  9.1 8.9 9.1  PHOS  --   --  7.5* 6.1*   CBC  Recent Labs Lab 03/01/14 1846 03/02/14 0510 03/05/14 1351  WBC 9.0 9.3 8.4  HGB 9.7* 10.1* 9.8*  HCT 30.6* 32.3* 30.5*  MCV 87.2 87.1 86.2  PLT 216 245 229   Medications:    . aspirin EC  81 mg Oral  QPM  . calcitRIOL  0.25 mcg Oral Once per day on Mon Wed Fri  . darbepoetin (ARANESP) injection - DIALYSIS  60 mcg Intravenous Q Wed-HD  . furosemide  160 mg Intravenous Q6H  . gabapentin  200 mg Oral Daily  . heparin  20 Units/kg Dialysis Once in dialysis  . heparin  5,000 Units Subcutaneous 3 times per day  . insulin aspart  0-15 Units Subcutaneous TID WC  . insulin aspart  0-5 Units Subcutaneous QHS  . insulin glargine  35 Units Subcutaneous QHS  . isosorbide mononitrate  120 mg Oral Daily  . levothyroxine  88 mcg Oral QAC breakfast  . metolazone  10 mg Oral Daily  . metoprolol  50 mg Oral BID  . nystatin  1 g Topical TID  . ranolazine  500 mg Oral BID   Elmarie Shiley, MD 03/06/2014, 12:01 PM

## 2014-03-07 LAB — CBC
HCT: 31.7 % — ABNORMAL LOW (ref 36.0–46.0)
Hemoglobin: 10.3 g/dL — ABNORMAL LOW (ref 12.0–15.0)
MCH: 27.4 pg (ref 26.0–34.0)
MCHC: 32.5 g/dL (ref 30.0–36.0)
MCV: 84.3 fL (ref 78.0–100.0)
Platelets: 285 10*3/uL (ref 150–400)
RBC: 3.76 MIL/uL — ABNORMAL LOW (ref 3.87–5.11)
RDW: 14.5 % (ref 11.5–15.5)
WBC: 8.4 10*3/uL (ref 4.0–10.5)

## 2014-03-07 LAB — BASIC METABOLIC PANEL
Anion gap: 21 — ABNORMAL HIGH (ref 5–15)
BUN: 56 mg/dL — ABNORMAL HIGH (ref 6–23)
CALCIUM: 9.6 mg/dL (ref 8.4–10.5)
CO2: 20 mEq/L (ref 19–32)
Chloride: 88 mEq/L — ABNORMAL LOW (ref 96–112)
Creatinine, Ser: 4.35 mg/dL — ABNORMAL HIGH (ref 0.50–1.10)
GFR, EST AFRICAN AMERICAN: 10 mL/min — AB (ref >90)
GFR, EST NON AFRICAN AMERICAN: 9 mL/min — AB (ref >90)
Glucose, Bld: 363 mg/dL — ABNORMAL HIGH (ref 70–99)
POTASSIUM: 3.6 meq/L — AB (ref 3.7–5.3)
SODIUM: 129 meq/L — AB (ref 137–147)

## 2014-03-07 LAB — GLUCOSE, CAPILLARY
GLUCOSE-CAPILLARY: 225 mg/dL — AB (ref 70–99)
GLUCOSE-CAPILLARY: 344 mg/dL — AB (ref 70–99)
GLUCOSE-CAPILLARY: 329 mg/dL — AB (ref 70–99)

## 2014-03-07 MED ORDER — INSULIN GLARGINE 100 UNIT/ML ~~LOC~~ SOLN
40.0000 [IU] | Freq: Every day | SUBCUTANEOUS | Status: DC
  Administered 2014-03-07: 40 [IU] via SUBCUTANEOUS
  Filled 2014-03-07 (×2): qty 0.4

## 2014-03-07 NOTE — Progress Notes (Signed)
[facility] PROGRESS NOTE  [name] YIR:[id number] DOB: [birth year] DOA: 03/01/2014 PCP: Rochel Brome, MD  Brief Summary  [name] is a 79 y.o. female with a past medical history of CKD status post fistula placement and left arm, hypertension, coronary artery disease with stent, peripheral vascular disease. The patient presented to [facility] 2 days prior to admission with complaints of shortness of breath that had been going on for the past 7 days. She was severely short of breath with even taking a few steps and had noted a 10 pound weight gain and inability to lay flat, having to sleep in a recliner. She was found to be in moderate respiratory distress and admitted to the step down unit. She was started on IV Lasix for fluid overload. Since Lasix was ineffective in diuresing her, she was sent to [facility] to receive dialysis.  Her fistula is not matured yet and so vascular placed a tunneled HD catheter in the right chest 7/7.  CLIP process started and she is scheduled for outpatient HD at [facility] on TTS.  Still volume overloaded and getting frequent HD.  Plan to get HD on Saturday and possible discharge.    Assessment/Plan  Acute on chronic renal failure, deemed ESRD by nephrology.  Possibility of dialysis had been anticipated and she has undergone fistula placement by Dr. Scot Dock with reevaluation by fistulogram a few weeks ago.  She did not respond well to lasix.   -  Temporary HD catheter initially placed by Dr. Jonnie Finner -  Vascular surgery consult:  HD cath placed today right chest.    DM type 2 (diabetes mellitus, type 2), CBG elevated  CBG (last 3)   Recent Labs  03/06/14 2226 03/07/14 0735 03/07/14 1110  GLUCAP 313* 225* 344*     Increase lantus to 40 units.  - increase to mod dose SSI - hypoglycemia protocol  HTN (hypertension), BP dropped during HD but still elevated.  Will likely continue to trend down as more fluid removed - continued  reduced BP medications for now -  Prn hydralazine  CAD in native artery:  Last cath 11/2011, LAD 50%; mid L Cx lum irregularities; mid RCA with 90% lesion with thrombus s/p stent to mid RCA.  Started at ticagrelor at that time.    -  Continue Imdur, Ranexa -  Continue ASA -  Hold ticagrelor (recieved AM dose on 7/5) prior to procedure.  Unspecified hypothyroidism  - Continue Synthroid  Gout flare left MTP joint, somewhat improved -  Received Colchicine x 1 -  Avoid NSAIDS unless okay by nephrology -  Avoid prednisone unless colch fails due to DM  Diet:  Renal/diabetic  Access:  PIV, temp HD cath IVF:  None Proph:  heparin  Code Status: full Family Communication: patient alone Disposition Plan:   On Saturday after HD.    Consultants:  Nephrology  Vascular surgery, Dr. Scot Dock  Procedures:  Temp HD cath 7/5 >> 7/7  Tunneled HD cath 7/7 >>  CXR  New HD  Antibiotics:  None   HPI/Subjective:  Comfortable today. Denies any complaints.   Objective: Filed Vitals:   03/06/14 1016 03/06/14 2100 03/07/14 0548 03/07/14 1006  BP: 132/67 151/51 124/48 118/63  Pulse: 66 82 58 74  Temp: 97.5 F (36.4 C) 98 F (36.7 C) 98.2 F (36.8 C) 98.2 F (36.8 C)  TempSrc: Oral Oral Oral Oral  Resp: 18 18 16 18   Height:      Weight:  90.1  kg (198 lb 10.2 oz)    SpO2: 95% 94% 95% 92%    Intake/Output Summary (Last 24 hours) at 03/07/14 1127 Last data filed at 03/07/14 0940  Gross per 24 hour  Intake   1224 ml  Output    475 ml  Net    749 ml   Filed Weights   03/05/14 1340 03/05/14 2033 03/06/14 2100  Weight: 91.8 kg (202 lb 6.1 oz) 89.313 kg (196 lb 14.4 oz) 90.1 kg (198 lb 10.2 oz)    Exam:   General:  CF, No acute distress  HEENT:  NCAT, MMM  Cardiovascular:  RRR, nl S1, S2 no mrg, 2+ pulses, warm extremities  Respiratory:  CTAB, no increased WOB  Abdomen:   NABS, soft, NT/ND  MSK:   Normal tone and bulk, 1+ bilatearl lower extremity soft pitting edema  and 1+ left hand and arm edema (recent duplex and fistulogram neg).  Less pinkness and less TTP of the 1st MTP joint   Neuro:  Grossly intact  Data Reviewed: Basic Metabolic Panel:  Recent Labs Lab 03/01/14 1846 03/02/14 0510 03/02/14 1058 03/05/14 1352  NA  --  139 135* 133*  K  --  4.1 4.7 3.8  CL  --  99 96 90*  CO2  --  20 20 25   GLUCOSE  --  48* 163* 225*  BUN  --  98* 98* 56*  CREATININE 4.74* 4.74* 4.79* 4.10*  CALCIUM  --  9.1 8.9 9.1  PHOS  --   --  7.5* 6.1*   Liver Function Tests:  Recent Labs Lab 03/02/14 1058 03/05/14 1352  ALBUMIN 2.8* 2.7*   No results found for this basename: LIPASE, AMYLASE,  in the last 168 hours No results found for this basename: AMMONIA,  in the last 168 hours CBC:  Recent Labs Lab 03/01/14 1846 03/02/14 0510 03/05/14 1351  WBC 9.0 9.3 8.4  HGB 9.7* 10.1* 9.8*  HCT 30.6* 32.3* 30.5*  MCV 87.2 87.1 86.2  PLT 216 245 229   Cardiac Enzymes: No results found for this basename: CKTOTAL, CKMB, CKMBINDEX, TROPONINI,  in the last 168 hours BNP (last 3 results) No results found for this basename: PROBNP,  in the last 8760 hours CBG:  Recent Labs Lab 03/06/14 1127 03/06/14 1703 03/06/14 2226 03/07/14 0735 03/07/14 1110  GLUCAP 197* 336* 313* 225* 344*    Recent Results (from the past 240 hour(s))  MRSA PCR SCREENING     Status: None   Collection Time    03/01/14  4:36 PM      Result Value Ref Range Status   MRSA by PCR NEGATIVE  NEGATIVE Final   Comment:            The GeneXpert MRSA Assay (FDA     approved for NASAL specimens     only), is one component of a     comprehensive MRSA colonization     surveillance program. It is not     intended to diagnose MRSA     infection nor to guide or     monitor treatment for     MRSA infections.     Studies: No results found.  Scheduled Meds: . aspirin EC  81 mg Oral QPM  . calcitRIOL  0.25 mcg Oral Once per day on Mon Wed Fri  . darbepoetin (ARANESP) injection -  DIALYSIS  60 mcg Intravenous Q Wed-HD  . furosemide  160 mg Intravenous Q6H  . gabapentin  200  mg Oral Daily  . heparin  20 Units/kg Dialysis Once in dialysis  . heparin  5,000 Units Subcutaneous 3 times per day  . insulin aspart  0-15 Units Subcutaneous TID WC  . insulin aspart  0-5 Units Subcutaneous QHS  . insulin glargine  35 Units Subcutaneous QHS  . isosorbide mononitrate  120 mg Oral Daily  . levothyroxine  88 mcg Oral QAC breakfast  . metolazone  10 mg Oral Daily  . metoprolol  50 mg Oral BID  . nystatin  1 g Topical TID  . ranolazine  500 mg Oral BID   Continuous Infusions: . sodium chloride 10 mL/hr (03/05/14 0359)    Principal Problem:   Renal failure (ARF), acute on chronic Active Problems:   DM type 2 (diabetes mellitus, type 2)   HTN (hypertension)   CAD in native artery   Unspecified hypothyroidism    Time spent: 20 min    [facility] Pager [phone]. If 7PM-7AM, please contact night-coverage at [email or web address], password [facility] 03/07/2014, 11:27 AM  LOS: 6 days

## 2014-03-07 NOTE — Progress Notes (Signed)
CBG 329 

## 2014-03-07 NOTE — Progress Notes (Signed)
Patient ID: [name], female   DOB: 01-25-1935, 80 y.o.   MRN: [id number]  [facility] Progress Note    Assessment/ Plan:   1 ESRD: New start to HD-TDC placed by VVS (poorly maturing left upper arm second stage DVT, awaits outpatient followup in August to assess this further). She has been placed at the [facility] kidney [facility] to start dialysis on July 14 on a Tuesday/Thursday/Saturday schedule. Continue with plan to dialyze her tomorrow early followed by discharge later tomorrow. 2 Volume excess / pulm edema- improving with HD/UF- will continue to work towards her EDW  3 HTN reduced BP meds for now  4 Anemia: check iron stores and start ESA  5 HPTH on vit D  6 DM2 on insulin   Subjective:   Reports to be feeling well-happy that pedal edema is improving    Objective:   BP 124/48  Pulse 58  Temp(Src) 98.2 F (36.8 C) (Oral)  Resp 16  Ht 5\' 1"  (1.549 m)  Wt 90.1 kg (198 lb 10.2 oz)  BMI 37.55 kg/m2  SpO2 95%  Intake/Output Summary (Last 24 hours) at 03/07/14 0958 Last data filed at 03/07/14 0600  Gross per 24 hour  Intake    744 ml  Output    475 ml  Net    269 ml   Weight change: -1.7 kg (-3 lb 12 oz)  Physical Exam: Gen: Comfortable sitting up in her recliner CVS: Pulse regular bradycardia, S1 and S2 normal Resp: Decreased breath sounds over bases otherwise clear to auscultation Abd: soft, obese, nontender Ext: one plus left ankle edema, trace right ankle edema  Imaging: No results found.  Labs: BMET  Recent Labs Lab 03/01/14 1846 03/02/14 0510 03/02/14 1058 03/05/14 1352  NA  --  139 135* 133*  K  --  4.1 4.7 3.8  CL  --  99 96 90*  CO2  --  20 20 25   GLUCOSE  --  48* 163* 225*  BUN  --  98* 98* 56*  CREATININE 4.74* 4.74* 4.79* 4.10*  CALCIUM  --  9.1 8.9 9.1  PHOS  --   --  7.5* 6.1*   CBC  Recent Labs Lab 03/01/14 1846 03/02/14 0510 03/05/14 1351  WBC 9.0 9.3 8.4  HGB 9.7* 10.1* 9.8*  HCT 30.6* 32.3* 30.5*  MCV 87.2  87.1 86.2  PLT 216 245 229    Medications:    . aspirin EC  81 mg Oral QPM  . calcitRIOL  0.25 mcg Oral Once per day on Mon Wed Fri  . darbepoetin (ARANESP) injection - DIALYSIS  60 mcg Intravenous Q Wed-HD  . furosemide  160 mg Intravenous Q6H  . gabapentin  200 mg Oral Daily  . heparin  20 Units/kg Dialysis Once in dialysis  . heparin  5,000 Units Subcutaneous 3 times per day  . insulin aspart  0-15 Units Subcutaneous TID WC  . insulin aspart  0-5 Units Subcutaneous QHS  . insulin glargine  35 Units Subcutaneous QHS  . isosorbide mononitrate  120 mg Oral Daily  . levothyroxine  88 mcg Oral QAC breakfast  . metolazone  10 mg Oral Daily  . metoprolol  50 mg Oral BID  . nystatin  1 g Topical TID  . ranolazine  500 mg Oral BID   Elmarie Shiley, MD 03/07/2014, 9:58 AM

## 2014-03-07 NOTE — Progress Notes (Signed)
Inpatient Diabetes Program Recommendations  AACE/ADA: New Consensus Statement on Inpatient Glycemic Control (2013)  Target Ranges:  Prepandial:   less than 140 mg/dL      Peak postprandial:   less than 180 mg/dL (1-2 hours)      Critically ill patients:  140 - 180 mg/dL     Results for ALAJIA, SCHMELZER (MRN [id number]) as of 03/07/2014 08:12  Ref. Range 03/06/2014 07:37 03/06/2014 11:27 03/06/2014 17:03 03/06/2014 22:26  Glucose-Capillary Latest Range: 70-99 mg/dL 294 (H) 197 (H) 336 (H) 313 (H)    Results for TAKERIA, MARQUINA (MRN [id number]) as of 03/07/2014 08:12  Ref. Range 03/07/2014 07:35  Glucose-Capillary Latest Range: 70-99 mg/dL 225 (H)     Home DM Meds:   Lantus 50 units QHS Novolog 9-22 units tid per SSI   **Patient having significant glucose elevations  **Eating 100% of meals    MD- Please consider the following:  1. Increase Lantus to 50 units QHS (home dose) 2. Add Novolog Meal Coverage- Novolog 4 units tid with meals    Will follow Wyn Quaker RN, MSN, CDE Diabetes Coordinator Inpatient Diabetes Program Team Pager: [phone] (8a-10p)

## 2014-03-08 LAB — RENAL FUNCTION PANEL
ALBUMIN: 2.9 g/dL — AB (ref 3.5–5.2)
Anion gap: 18 — ABNORMAL HIGH (ref 5–15)
BUN: 64 mg/dL — AB (ref 6–23)
CO2: 24 mEq/L (ref 19–32)
CREATININE: 4.78 mg/dL — AB (ref 0.50–1.10)
Calcium: 9.8 mg/dL (ref 8.4–10.5)
Chloride: 87 mEq/L — ABNORMAL LOW (ref 96–112)
GFR calc Af Amer: 9 mL/min — ABNORMAL LOW (ref >90)
GFR, EST NON AFRICAN AMERICAN: 8 mL/min — AB (ref >90)
Glucose, Bld: 288 mg/dL — ABNORMAL HIGH (ref 70–99)
PHOSPHORUS: 4.6 mg/dL (ref 2.3–4.6)
Potassium: 3.6 mEq/L — ABNORMAL LOW (ref 3.7–5.3)
Sodium: 129 mEq/L — ABNORMAL LOW (ref 137–147)

## 2014-03-08 LAB — GLUCOSE, CAPILLARY
GLUCOSE-CAPILLARY: 301 mg/dL — AB (ref 70–99)
Glucose-Capillary: 154 mg/dL — ABNORMAL HIGH (ref 70–99)

## 2014-03-08 LAB — CBC
HCT: 32.4 % — ABNORMAL LOW (ref 36.0–46.0)
Hemoglobin: 10.7 g/dL — ABNORMAL LOW (ref 12.0–15.0)
MCH: 27.6 pg (ref 26.0–34.0)
MCHC: 33 g/dL (ref 30.0–36.0)
MCV: 83.7 fL (ref 78.0–100.0)
PLATELETS: 271 10*3/uL (ref 150–400)
RBC: 3.87 MIL/uL (ref 3.87–5.11)
RDW: 14.5 % (ref 11.5–15.5)
WBC: 9.9 10*3/uL (ref 4.0–10.5)

## 2014-03-08 MED ORDER — INSULIN ASPART 100 UNIT/ML ~~LOC~~ SOLN: SUBCUTANEOUS | Status: DC

## 2014-03-08 MED ORDER — NEPRO/CARBSTEADY PO LIQD: 237.0000 mL | ORAL | Status: DC | PRN

## 2014-03-08 MED ORDER — FUROSEMIDE 40 MG PO TABS: 40.0000 mg | ORAL_TABLET | Freq: Every day | ORAL | Status: DC

## 2014-03-08 MED ORDER — METOPROLOL TARTRATE 50 MG PO TABS: 50.0000 mg | ORAL_TABLET | Freq: Two times a day (BID) | ORAL | Status: DC

## 2014-03-08 MED ORDER — METOLAZONE 10 MG PO TABS: 10.0000 mg | ORAL_TABLET | Freq: Every day | ORAL | Status: DC

## 2014-03-08 MED ORDER — INSULIN GLARGINE 100 UNIT/ML ~~LOC~~ SOLN: 40.0000 [IU] | Freq: Every day | SUBCUTANEOUS | Status: DC

## 2014-03-08 NOTE — Discharge Summary (Signed)
Physician Discharge Summary  [name] QMG:[id number] DOB: 25-Jun-1935 DOA: 03/01/2014  PCP: Rochel Brome, MD  Admit date: 03/01/2014 Discharge date: 03/08/2014  Time spent: 30 minutes  Recommendations for Outpatient Follow-up:  1. Follow u with cardiology 2. Follow up with nephrology as recommended.   Discharge Diagnoses:  Principal Problem:   Renal failure (ARF), acute on chronic Active Problems:   DM type 2 (diabetes mellitus, type 2)   HTN (hypertension)   CAD in native artery   Unspecified hypothyroidism   Discharge Condition: improved.   Diet recommendation: renal   Filed Weights   03/07/14 2239 03/08/14 0700 03/08/14 1100  Weight: 91 kg (200 lb 9.9 oz) 89.7 kg (197 lb 12 oz) 87.3 kg (192 lb 7.4 oz)    History of present illness:  [name] is a 79 y.o. female with a past medical history of CKD status post fistula placement and left arm, hypertension, coronary artery disease with stent, peripheral vascular disease. The patient presented to [facility] 2 days prior to admission with complaints of shortness of breath that had been going on for the past 7 days. She was severely short of breath with even taking a few steps and had noted a 10 pound weight gain and inability to lay flat, having to sleep in a recliner. She was found to be in moderate respiratory distress and admitted to the step down unit. She was started on IV Lasix for fluid overload. Since Lasix was ineffective in diuresing her, she was sent to [facility] to receive dialysis. Her fistula is not matured yet and so vascular placed a tunneled HD catheter in the right chest 7/7. CLIP process started and she is scheduled for outpatient HD at [facility] on TTS. Still volume overloaded and getting frequent HD. Plan to get HD on Saturday and possible discharge.    Hospital Course:  Acute on chronic renal failure, deemed ESRD by nephrology.  - Temporary HD catheter initially placed by Dr. Jonnie Finner   - Vascular surgery consult: HD cath placed today right chest.  She underwent HD and outpatient HD set up.   DM type 2 (diabetes mellitus, type 2), : DM meds changed and prescriptions given.   HTN (hypertension),  Better controlled on discharge.  CAD in native artery: Last cath 11/2011, LAD 50%; mid L Cx lum irregularities; mid RCA with 90% lesion with thrombus s/p stent to mid RCA. Started at ticagrelor at that time.  Currently chest pain, resume home medications. Doses changed, recommended she follow up with cardiology as outpatient to optimize her meds.  Unspecified hypothyroidism  Resume  Synthroid on discharge.   Gout flare left MTP joint, somewhat improved  Received one dose of colchicine and her pain improved.    Procedures:  HD Temp HD cath 7/5 >> 7/7  Tunneled HD cath 7/7 >>    Consultations:  Nephrology  VASCULAR SURGERY  Discharge Exam: [name] Vitals:   03/08/14 1145  BP: 123/72  Pulse: 96  Temp: 98.2 F (36.8 C)  Resp:     General: alert afebrile comfortable Cardiovascular: s1s2 Respiratory: ctab  Discharge Instructions You were cared for by a hospitalist during your hospital stay. If you have any questions about your discharge medications or the care you received while you were in the hospital after you are discharged, you can call the unit and asked to speak with the hospitalist on call if the hospitalist that took care of you is not available. Once you are discharged, your primary  care physician will handle any further medical issues. Please note that NO REFILLS for any discharge medications will be authorized once you are discharged, as it is imperative that you return to your primary care physician (or establish a relationship with a primary care physician if you do not have one) for your aftercare needs so that they can reassess your need for medications and monitor your lab values.  Discharge Instructions   Discharge instructions    Complete by:  As  directed   Follow up with your nephrologist as recommended.  Please go to [facility] for HD on 7/14 and continue on Tuesday , Thursday and Saturday.  Follow up with your PCP i one week.  Please check basic metabolic panel to check electrolytes on Tuesday at HD center.            Medication List    STOP taking these medications       amLODipine 5 MG tablet  Commonly known as:  NORVASC     NOVOLOG FLEXPEN 100 UNIT/ML FlexPen  Generic drug:  insulin aspart  Replaced by:  insulin aspart 100 UNIT/ML injection     oxyCODONE 5 MG immediate release tablet  Commonly known as:  ROXICODONE     oxyCODONE-acetaminophen 5-325 MG per tablet  Commonly known as:  PERCOCET/ROXICET      TAKE these medications       acetaminophen 500 MG tablet  Commonly known as:  TYLENOL  Take 1,000 mg by mouth 3 (three) times daily as needed for mild pain.     albuterol (2.5 MG/3ML) 0.083% nebulizer solution  Commonly known as:  PROVENTIL  Take 2.5 mg by nebulization every 6 (six) hours as needed for wheezing or shortness of breath.     albuterol 108 (90 BASE) MCG/ACT inhaler  Commonly known as:  PROVENTIL HFA;VENTOLIN HFA  Inhale 2 puffs into the lungs every 6 (six) hours as needed for wheezing or shortness of breath.     aspirin EC 81 MG tablet  Take 81 mg by mouth every evening.     calcitRIOL 0.25 MCG capsule  Commonly known as:  ROCALTROL  Take 0.25 mcg by mouth 3 (three) times a week. Sunday, Wednesday, and Friday     feeding supplement (NEPRO CARB STEADY) Liqd  Take 237 mLs by mouth as needed (missed meal during dialysis.).     Fish Oil 1000 MG Caps  Take 1,000 mg by mouth 2 (two) times daily.     furosemide 40 MG tablet  Commonly known as:  LASIX  Take 1 tablet (40 mg total) by mouth daily.     gabapentin 400 MG capsule  Commonly known as:  NEURONTIN  Take 400 mg by mouth 3 (three) times daily.     insulin aspart 100 UNIT/ML injection  Commonly known as:  novoLOG  - CBG  70 - 120: 0 units  - CBG 121 - 150: 2 units  - CBG 151 - 200: 3 units  - CBG 201 - 250: 5 units  - CBG 251 - 300: 8 units  - CBG 301 - 350: 11 units  - CBG 351 - 400: 15 units     insulin glargine 100 UNIT/ML injection  Commonly known as:  LANTUS  Inject 0.4 mLs (40 Units total) into the skin at bedtime.     isosorbide mononitrate 60 MG 24 hr tablet  Commonly known as:  IMDUR  Take 120 mg by mouth daily.     levothyroxine  88 MCG tablet  Commonly known as:  SYNTHROID, LEVOTHROID  Take 88 mcg by mouth daily before breakfast.     metolazone 10 MG tablet  Commonly known as:  ZAROXOLYN  Take 1 tablet (10 mg total) by mouth daily.     metoprolol 50 MG tablet  Commonly known as:  LOPRESSOR  Take 1 tablet (50 mg total) by mouth 2 (two) times daily.     nitroGLYCERIN 0.4 MG SL tablet  Commonly known as:  NITROSTAT  Place 0.4 mg under the tongue every 5 (five) minutes as needed for chest pain.     nystatin 100000 UNIT/GM Powd  Apply 1 g topically every morning.     pravastatin 20 MG tablet  Commonly known as:  PRAVACHOL  Take 10 mg by mouth daily.     ranolazine 500 MG 12 hr tablet  Commonly known as:  RANEXA  Take 500 mg by mouth 2 (two) times daily.     ticagrelor 90 MG Tabs tablet  Commonly known as:  BRILINTA  Take 1 tablet (90 mg total) by mouth 2 (two) times daily.       Allergies  Allergen Reactions  . Macrobid [Nitrofurantoin Macrocrystal] Anaphylaxis and Rash  . Ace Inhibitors Other (See Comments)    hyperkalemia  . Angiotensin Receptor Blockers Other (See Comments)    hyperkalemia  . Influenza Vaccines Itching, Rash and Other (See Comments)    blisters       Follow-up Information   Follow up with DICKSON,CHRISTOPHER S, MD In 6 weeks. (sent message to office)    Specialty:  Vascular Surgery   Contact information:   [address] [place] [address] [phone]       Follow up with Rochel Brome, MD. Schedule an appointment as soon as possible  for a visit in 1 week.   Specialty:  Family Medicine   Contact information:   [place] [address] [phone]        The results of significant diagnostics from this hospitalization (including imaging, microbiology, ancillary and laboratory) are listed below for reference.    Significant Diagnostic Studies: Dg Chest Port 1 View  03/04/2014   CLINICAL DATA:  Status post diet tech catheter insertion  EXAM: PORTABLE CHEST - 1 VIEW  COMPARISON:  Portable chest x-ray of March 02, 2014  FINDINGS: There has been interval removal of the previous catheter with placement of the diatek catheter. There is no postprocedure pneumothorax. The tip of the catheter lies in the cavoatrial junction. The cardiac silhouette is mildly enlarged but stable. The central pulmonary vascularity is prominent. The interstitial markings remain mildly increased. The left hemidiaphragm remains obscured.  IMPRESSION: There is no immediate postprocedure complication following placement of the diet tech catheter. There is stable low-grade CHF.   Electronically Signed   By: David  Martinique   On: 03/04/2014 14:50   Dg Chest Port 1 View  03/02/2014   CLINICAL DATA:  Status post central line placement  EXAM: PORTABLE CHEST - 1 VIEW  COMPARISON:  Portable chest x-ray of March 01, 2014  FINDINGS: A right internal jugular venous catheter is been placed with the tip at the level of the distal third of the superior vena cava. There is no postprocedure pneumothorax or hemo thorax. The lungs are adequately inflated. The left hemidiaphragm is obscured. The interstitial markings are mildly prominent. The cardiac silhouette is enlarged and the pulmonary vascularity is engorged.  IMPRESSION: There is no postprocedure complication following placement of  the right internal jugular venous catheter.   Electronically Signed   By: David  Martinique   On: 03/02/2014 13:21   Dg Chest Port 1v Same Day  03/02/2014   CLINICAL DATA:  Shortness of  breath.  EXAM: PORTABLE CHEST - 1 VIEW SAME DAY  COMPARISON:  02/28/2014  FINDINGS: Study limited by body habitus and portable nature of the study. There is cardiomegaly with vascular congestion. No definite focal airspace opacity or effusion. Diffuse interstitial prominence may reflect early interstitial edema.  IMPRESSION: Limited study by body habitus. Cardiomegaly with vascular congestion and suggestion of early interstitial edema.   Electronically Signed   By: Rolm Baptise M.D.   On: 03/02/2014 01:08   Dg Fluoro Guide Cv Line-no Report  03/04/2014   CLINICAL DATA: hemodialysis access   FLOURO GUIDE CV LINE  Fluoroscopy was utilized by the requesting physician.  No radiographic  interpretation.     Microbiology: Recent Results (from the past 240 hour(s))  MRSA PCR SCREENING     Status: None   Collection Time    03/01/14  4:36 PM      Result Value Ref Range Status   MRSA by PCR NEGATIVE  NEGATIVE Final   Comment:            The GeneXpert MRSA Assay (FDA     approved for NASAL specimens     only), is one component of a     comprehensive MRSA colonization     surveillance program. It is not     intended to diagnose MRSA     infection nor to guide or     monitor treatment for     MRSA infections.     Labs: Basic Metabolic Panel:  Recent Labs Lab 03/02/14 0510 03/02/14 1058 03/05/14 1352 03/07/14 1330 03/08/14 0702  NA 139 135* 133* 129* 129*  K 4.1 4.7 3.8 3.6* 3.6*  CL 99 96 90* 88* 87*  CO2 20 20 25 20 24   GLUCOSE 48* 163* 225* 363* 288*  BUN 98* 98* 56* 56* 64*  CREATININE 4.74* 4.79* 4.10* 4.35* 4.78*  CALCIUM 9.1 8.9 9.1 9.6 9.8  PHOS  --  7.5* 6.1*  --  4.6   Liver Function Tests:  Recent Labs Lab 03/02/14 1058 03/05/14 1352 03/08/14 0702  ALBUMIN 2.8* 2.7* 2.9*   No results found for this basename: LIPASE, AMYLASE,  in the last 168 hours No results found for this basename: AMMONIA,  in the last 168 hours CBC:  Recent Labs Lab 03/01/14 1846  03/02/14 0510 03/05/14 1351 03/07/14 1330 03/08/14 0702  WBC 9.0 9.3 8.4 8.4 9.9  HGB 9.7* 10.1* 9.8* 10.3* 10.7*  HCT 30.6* 32.3* 30.5* 31.7* 32.4*  MCV 87.2 87.1 86.2 84.3 83.7  PLT 216 245 229 285 271   Cardiac Enzymes: No results found for this basename: CKTOTAL, CKMB, CKMBINDEX, TROPONINI,  in the last 168 hours BNP: BNP (last 3 results) No results found for this basename: PROBNP,  in the last 8760 hours CBG:  Recent Labs Lab 03/07/14 0735 03/07/14 1110 03/07/14 1646 03/07/14 2231 03/08/14 1121  GLUCAP 225* 344* 329* 301* 154*       Signed:  Lavender Stanke  [facility] 03/08/2014, 2:19 PM

## 2014-03-08 NOTE — Progress Notes (Signed)
Pt returned from HD requesting to speak with MD. Pt requesting to hold off on IV lasix as she "is discharging today." MD paged, will continue to monitor.

## 2014-03-08 NOTE — Progress Notes (Signed)
Patient ID: [name], female   DOB: 24-Oct-1934, 79 y.o.   MRN: [id number]  [facility] Progress Note   Assessment/ Plan:   1 ESRD: New start to HD-TDC placed by [facility] (poorly maturing left upper arm second stage DVT, awaits outpatient followup in August to assess this further). To start hemodialysis at [facility] on 7/14 (T./T./S.). I have instructed her to go to the dialysis Center on Monday to get her paperwork signed. She appears to be stable for discharge today after dialysis. 2 Volume excess / pulm edema- improving with HD/UF- will continue to work towards her EDW  3 HTN reduced BP meds for now  4 Anemia: check iron stores and start ESA  5 HPTH on vit D  6 DM2 on insulin   Subjective:   Reports to be feeling well and looking forward to be discharged later today    Objective:   BP 110/56  Pulse 72  Temp(Src) 97.2 F (36.2 C) (Oral)  Resp 18  Ht 5\' 1"  (1.549 m)  Wt 89.7 kg (197 lb 12 oz)  BMI 37.38 kg/m2  SpO2 92%  Physical Exam: Gen: Comfortably resting on dialysis CVS: Pulse regular in rate and rhythm, S1 and S2 normal without murmur or rub Resp: Clear to auscultation, no rales Abd: Soft, obese, nontender Ext: Trace lower extremity edema right, 1+ left  Labs: BMET  Recent Labs Lab 03/01/14 1846 03/02/14 0510 03/02/14 1058 03/05/14 1352 03/07/14 1330 03/08/14 0702  NA  --  139 135* 133* 129* 129*  K  --  4.1 4.7 3.8 3.6* 3.6*  CL  --  99 96 90* 88* 87*  CO2  --  20 20 25 20 24   GLUCOSE  --  48* 163* 225* 363* 288*  BUN  --  98* 98* 56* 56* 64*  CREATININE 4.74* 4.74* 4.79* 4.10* 4.35* 4.78*  CALCIUM  --  9.1 8.9 9.1 9.6 9.8  PHOS  --   --  7.5* 6.1*  --  4.6   CBC  Recent Labs Lab 03/02/14 0510 03/05/14 1351 03/07/14 1330 03/08/14 0702  WBC 9.3 8.4 8.4 9.9  HGB 10.1* 9.8* 10.3* 10.7*  HCT 32.3* 30.5* 31.7* 32.4*  MCV 87.1 86.2 84.3 83.7  PLT 245 229 285 271   Medications:    . aspirin EC  81 mg Oral QPM  .  calcitRIOL  0.25 mcg Oral Once per day on Mon Wed Fri  . darbepoetin (ARANESP) injection - DIALYSIS  60 mcg Intravenous Q Wed-HD  . furosemide  160 mg Intravenous Q6H  . gabapentin  200 mg Oral Daily  . heparin  5,000 Units Subcutaneous 3 times per day  . insulin aspart  0-15 Units Subcutaneous TID WC  . insulin aspart  0-5 Units Subcutaneous QHS  . insulin glargine  40 Units Subcutaneous QHS  . isosorbide mononitrate  120 mg Oral Daily  . levothyroxine  88 mcg Oral QAC breakfast  . metolazone  10 mg Oral Daily  . metoprolol  50 mg Oral BID  . nystatin  1 g Topical TID  . ranolazine  500 mg Oral BID   Elmarie Shiley, MD 03/08/2014, 9:53 AM

## 2014-03-08 NOTE — Progress Notes (Signed)
Pt signed d/c papers, rx's given. Instructions given on medication regimen, follow up appointments, renal diet/diet restrictions, IV removed, questions answered. Pt verbalized understanding. Instructions given on keeping tunneled catheter clean/dry, s/sx of infection, fluid restriction, importance of monitoring daily weights, and communication with MD regarding weight gain an sx. Escorted to car via wheelchair, d/c papers given to son.

## 2014-03-08 NOTE — Procedures (Signed)
Patient seen on Hemodialysis. QB 350, UF goal 3.5L Treatment adjusted as needed.  Elmarie Shiley MD [facility]. Office # [phone] Pager # [phone] 9:51 AM

## 2014-03-13 ENCOUNTER — Other Ambulatory Visit: Payer: Self-pay

## 2014-03-21 ENCOUNTER — Other Ambulatory Visit: Payer: Self-pay

## 2014-03-21 DIAGNOSIS — Z01810 Encounter for preprocedural cardiovascular examination: Secondary | ICD-10-CM

## 2014-03-21 DIAGNOSIS — N186 End stage renal disease: Secondary | ICD-10-CM

## 2014-04-01 ENCOUNTER — Encounter: Payer: Self-pay | Admitting: Vascular Surgery

## 2014-04-02 ENCOUNTER — Other Ambulatory Visit: Payer: Self-pay | Admitting: Vascular Surgery

## 2014-04-02 ENCOUNTER — Ambulatory Visit (INDEPENDENT_AMBULATORY_CARE_PROVIDER_SITE_OTHER): Payer: Self-pay | Admitting: Vascular Surgery

## 2014-04-02 ENCOUNTER — Ambulatory Visit (HOSPITAL_COMMUNITY)
Admission: RE | Admit: 2014-04-02 | Discharge: 2014-04-02 | Disposition: A | Discharge status: Home / Self Care | Payer: Medicare Other | Source: Ambulatory Visit | Attending: Vascular Surgery | Admitting: Vascular Surgery

## 2014-04-02 ENCOUNTER — Encounter: Payer: Self-pay | Admitting: Vascular Surgery

## 2014-04-02 ENCOUNTER — Other Ambulatory Visit (HOSPITAL_COMMUNITY): Payer: Medicare Other

## 2014-04-02 ENCOUNTER — Ambulatory Visit (INDEPENDENT_AMBULATORY_CARE_PROVIDER_SITE_OTHER)
Admission: RE | Admit: 2014-04-02 | Discharge: 2014-04-02 | Disposition: A | Discharge status: Home / Self Care | Payer: Medicare Other | Source: Ambulatory Visit | Attending: Vascular Surgery | Admitting: Vascular Surgery

## 2014-04-02 VITALS — BP 108/54 | HR 73 | Ht 61.0 in | Wt 194.3 lb

## 2014-04-02 DIAGNOSIS — N186 End stage renal disease: Secondary | ICD-10-CM

## 2014-04-02 DIAGNOSIS — Z01810 Encounter for preprocedural cardiovascular examination: Secondary | ICD-10-CM

## 2014-04-02 NOTE — Progress Notes (Signed)
[facility] POST OPERATIVE OFFICE NOTE  CC:  F/u for non maturing left BVT  HPI:  This is a 79 y.o. female who is s/p left 2nd stage BVT on 12/17/13, fistulogram on 02/10/14, and diatek catheter placement on 03/04/14 by Dr. Bridgett Larsson.    At the time of the fistulogram, she was found to have a widely patent fistula with an area of concern seen on duplex that likely represented a valve site.  There was minimal stenosis noted at that area with some mild irregularity.  She presents today with follow up duplex.   She denies any hx of steal sx.   She is on Brilinta after having an MI a couple of years ago.    Allergies  Allergen Reactions  . Macrobid [Nitrofurantoin Macrocrystal] Anaphylaxis and Rash  . Ace Inhibitors Other (See Comments)    hyperkalemia  . Angiotensin Receptor Blockers Other (See Comments)    hyperkalemia  . Influenza Vaccines Itching, Rash and Other (See Comments)    blisters    Current Outpatient Prescriptions  Medication Sig Dispense Refill  . acetaminophen (TYLENOL) 500 MG tablet Take 1,000 mg by mouth 3 (three) times daily as needed for mild pain.       Marland Kitchen albuterol (PROVENTIL HFA;VENTOLIN HFA) 108 (90 BASE) MCG/ACT inhaler Inhale 2 puffs into the lungs every 6 (six) hours as needed for wheezing or shortness of breath.      Marland Kitchen albuterol (PROVENTIL) (2.5 MG/3ML) 0.083% nebulizer solution Take 2.5 mg by nebulization every 6 (six) hours as needed for wheezing or shortness of breath.       Marland Kitchen aspirin EC 81 MG tablet Take 81 mg by mouth every evening.      . calcitRIOL (ROCALTROL) 0.25 MCG capsule Take 0.25 mcg by mouth 3 (three) times a week. Sunday, Wednesday, and Friday      . gabapentin (NEURONTIN) 400 MG capsule Take 400 mg by mouth 3 (three) times daily.      . insulin aspart (NOVOLOG) 100 UNIT/ML injection CBG 70 - 120: 0 units CBG 121 - 150: 2 units CBG 151 - 200: 3 units CBG 201 - 250: 5 units CBG 251 - 300: 8 units CBG 301 - 350: 11 units CBG 351 -  400: 15 units  10 mL  2  . insulin glargine (LANTUS) 100 UNIT/ML injection Inject 0.4 mLs (40 Units total) into the skin at bedtime.  10 mL  2  . isosorbide mononitrate (IMDUR) 60 MG 24 hr tablet Take 120 mg by mouth daily.       Marland Kitchen levothyroxine (SYNTHROID, LEVOTHROID) 88 MCG tablet Take 88 mcg by mouth daily before breakfast.      . metolazone (ZAROXOLYN) 10 MG tablet Take 1 tablet (10 mg total) by mouth daily.  30 tablet  1  . metoprolol (LOPRESSOR) 50 MG tablet Take 1 tablet (50 mg total) by mouth 2 (two) times daily.  60 tablet  1  . nitroGLYCERIN (NITROSTAT) 0.4 MG SL tablet Place 0.4 mg under the tongue every 5 (five) minutes as needed for chest pain.      . Nutritional Supplements (FEEDING SUPPLEMENT, NEPRO CARB STEADY,) LIQD Take 237 mLs by mouth as needed (missed meal during dialysis.).    0  . nystatin (MYCOSTATIN/NYSTOP) 100000 UNIT/GM POWD Apply 1 g topically every morning.      . Omega-3 Fatty Acids (FISH OIL) 1000 MG CAPS Take 1,000 mg by mouth 2 (two) times daily.      Marland Kitchen  pravastatin (PRAVACHOL) 20 MG tablet Take 10 mg by mouth daily.       . ranolazine (RANEXA) 500 MG 12 hr tablet Take 500 mg by mouth 2 (two) times daily.      . Ticagrelor (BRILINTA) 90 MG TABS tablet Take 1 tablet (90 mg total) by mouth 2 (two) times daily.  60 tablet     No current facility-administered medications for this visit.   Family History  Problem Relation Age of Onset  . Diabetes Sister   . Heart disease Sister     before age 1  . Hyperlipidemia Sister   . Hypertension Sister   . Diabetes Sister   . Hyperlipidemia Sister   . Hypertension Sister    ROS:  See HPI  Physical Exam:  Filed Vitals:   04/02/14 1453  BP: 108/54  Pulse: 73   General:  NAD Incision:  Are well healed Extremities:  + palpable bilateral radial pulses; + thrill in left BVT, but is difficult to palpate Cardiac:  RRR Lungs:  CTAB Abdomen:  Obese, non tender to palpation  Neck:  No carotid bruits are heard; right  IJ diatek catheter is in place.  Right arm vein mapping 04/02/14: Diameters of right basilic vein range from 6.38-7.5IE  Diameters of right cephalic vein range from 3.32-9.51OA  1.  Patent and compressible right basilic vein  2.  Right cephalic vein is not visualized at the distal upper arm to proximal forearm, with small caliber of vessel 3.  A branch is visualized arising off the mid upper arm cephalic vein  A/P:  This is a 79 y.o. female here for f/u after left 2nd stage BVT on 12/17/13 and fistuolgram on 02/10/14.  She had a diatek catheter placed on 03/04/14 by Dr. Bridgett Larsson.  -her left upper arm BVT is poorly maturing.  She did have vein map today of the right arm and the right basilic vein appears to be adequate for a right BVT, which we will proceed with on 04/18/14 by Dr. Scot Dock -she is on Brilinta-will discontinue Brilinta 5 days prior to procedure   [name], PA-C [facility] [phone]  Clinic MD:  Pt seen and examined with Dr. Scot Dock  Agree with above. The left upper arm fistula has a very weak thrill and bruit. I do not think that this is salvageable. Therefore I would recommend new access. Based on her vein mapping today, it appears that the forearm and upper arm cephalic veins are marginal but there is a small chance that they would be adequate. Her basilic vein looks reasonable in size and this too may be an option. If none of her veins are adequate then she would require placement of an AV graft. Her surgery has been scheduled for 04/18/2014.  [name], MD, [place] [phone] 04/02/2014

## 2014-04-03 ENCOUNTER — Other Ambulatory Visit: Payer: Self-pay | Admitting: *Deleted

## 2014-04-16 ENCOUNTER — Encounter (HOSPITAL_COMMUNITY): Payer: Self-pay | Admitting: Pharmacy Technician

## 2014-04-17 ENCOUNTER — Encounter (HOSPITAL_COMMUNITY): Payer: Self-pay | Admitting: *Deleted

## 2014-04-17 MED ORDER — DEXTROSE 5 % IV SOLN
1.5000 g | INTRAVENOUS | Status: DC
  Filled 2014-04-17: qty 1.5

## 2014-04-18 ENCOUNTER — Telehealth: Payer: Self-pay | Admitting: Vascular Surgery

## 2014-04-18 ENCOUNTER — Encounter (HOSPITAL_COMMUNITY): Payer: Medicare Other | Admitting: Anesthesiology

## 2014-04-18 ENCOUNTER — Ambulatory Visit (HOSPITAL_COMMUNITY)
Admission: RE | Admit: 2014-04-18 | Discharge: 2014-04-18 | Disposition: A | Discharge status: Home / Self Care | Payer: Medicare Other | Source: Ambulatory Visit | Attending: Vascular Surgery | Admitting: Vascular Surgery

## 2014-04-18 ENCOUNTER — Encounter (HOSPITAL_COMMUNITY): Payer: Self-pay | Admitting: *Deleted

## 2014-04-18 ENCOUNTER — Other Ambulatory Visit: Payer: Self-pay | Admitting: *Deleted

## 2014-04-18 ENCOUNTER — Ambulatory Visit (HOSPITAL_COMMUNITY): Payer: Medicare Other | Admitting: Anesthesiology

## 2014-04-18 ENCOUNTER — Encounter (HOSPITAL_COMMUNITY)
Admission: RE | Disposition: A | Discharge status: Home / Self Care | Payer: Self-pay | Source: Ambulatory Visit | Attending: Vascular Surgery

## 2014-04-18 DIAGNOSIS — Z992 Dependence on renal dialysis: Secondary | ICD-10-CM | POA: Insufficient documentation

## 2014-04-18 DIAGNOSIS — Z881 Allergy status to other antibiotic agents status: Secondary | ICD-10-CM | POA: Insufficient documentation

## 2014-04-18 DIAGNOSIS — Z79899 Other long term (current) drug therapy: Secondary | ICD-10-CM | POA: Diagnosis not present

## 2014-04-18 DIAGNOSIS — Z887 Allergy status to serum and vaccine status: Secondary | ICD-10-CM | POA: Insufficient documentation

## 2014-04-18 DIAGNOSIS — N186 End stage renal disease: Secondary | ICD-10-CM

## 2014-04-18 DIAGNOSIS — Z48812 Encounter for surgical aftercare following surgery on the circulatory system: Secondary | ICD-10-CM

## 2014-04-18 DIAGNOSIS — Z888 Allergy status to other drugs, medicaments and biological substances status: Secondary | ICD-10-CM | POA: Insufficient documentation

## 2014-04-18 DIAGNOSIS — I252 Old myocardial infarction: Secondary | ICD-10-CM | POA: Insufficient documentation

## 2014-04-18 DIAGNOSIS — I120 Hypertensive chronic kidney disease with stage 5 chronic kidney disease or end stage renal disease: Secondary | ICD-10-CM | POA: Diagnosis not present

## 2014-04-18 DIAGNOSIS — T82598A Other mechanical complication of other cardiac and vascular devices and implants, initial encounter: Secondary | ICD-10-CM | POA: Insufficient documentation

## 2014-04-18 DIAGNOSIS — N185 Chronic kidney disease, stage 5: Secondary | ICD-10-CM | POA: Insufficient documentation

## 2014-04-18 DIAGNOSIS — Z7982 Long term (current) use of aspirin: Secondary | ICD-10-CM | POA: Diagnosis not present

## 2014-04-18 DIAGNOSIS — Y9210 Unspecified residential institution as the place of occurrence of the external cause: Secondary | ICD-10-CM | POA: Diagnosis not present

## 2014-04-18 DIAGNOSIS — Y841 Kidney dialysis as the cause of abnormal reaction of the patient, or of later complication, without mention of misadventure at the time of the procedure: Secondary | ICD-10-CM | POA: Insufficient documentation

## 2014-04-18 DIAGNOSIS — Z794 Long term (current) use of insulin: Secondary | ICD-10-CM | POA: Insufficient documentation

## 2014-04-18 HISTORY — PX: BASCILIC VEIN TRANSPOSITION: SHX5742

## 2014-04-18 HISTORY — DX: Hypothyroidism, unspecified: E03.9

## 2014-04-18 LAB — GLUCOSE, CAPILLARY: GLUCOSE-CAPILLARY: 239 mg/dL — AB (ref 70–99)

## 2014-04-18 LAB — POCT I-STAT 4, (NA,K, GLUC, HGB,HCT)
Glucose, Bld: 245 mg/dL — ABNORMAL HIGH (ref 70–99)
HCT: 38 % (ref 36.0–46.0)
Hemoglobin: 12.9 g/dL (ref 12.0–15.0)
Potassium: 4.1 meq/L (ref 3.7–5.3)
Sodium: 134 meq/L — ABNORMAL LOW (ref 137–147)

## 2014-04-18 LAB — PROTIME-INR
INR: 0.93 (ref 0.00–1.49)
Prothrombin Time: 12.5 seconds (ref 11.6–15.2)

## 2014-04-18 LAB — APTT: APTT: 22 s — AB (ref 24–37)

## 2014-04-18 SURGERY — TRANSPOSITION, VEIN, BASILIC
Anesthesia: General | Site: Arm Upper | Laterality: Right

## 2014-04-18 MED ORDER — ONDANSETRON HCL 4 MG/2ML IJ SOLN
INTRAMUSCULAR | Status: DC | PRN
  Administered 2014-04-18: 4 mg via INTRAVENOUS

## 2014-04-18 MED ORDER — PHENYLEPHRINE HCL 10 MG/ML IJ SOLN
INTRAMUSCULAR | Status: DC | PRN
  Administered 2014-04-18 (×4): 80 ug via INTRAVENOUS

## 2014-04-18 MED ORDER — MIDAZOLAM HCL 2 MG/2ML IJ SOLN
INTRAMUSCULAR | Status: AC
  Filled 2014-04-18: qty 2

## 2014-04-18 MED ORDER — PROPOFOL 10 MG/ML IV BOLUS
INTRAVENOUS | Status: AC
  Filled 2014-04-18: qty 20

## 2014-04-18 MED ORDER — ONDANSETRON HCL 4 MG/2ML IJ SOLN: 4.0000 mg | Freq: Once | INTRAMUSCULAR | Status: DC | PRN

## 2014-04-18 MED ORDER — FENTANYL CITRATE 0.05 MG/ML IJ SOLN
INTRAMUSCULAR | Status: AC
  Filled 2014-04-18: qty 5

## 2014-04-18 MED ORDER — CHLORHEXIDINE GLUCONATE CLOTH 2 % EX PADS: 6.0000 | MEDICATED_PAD | Freq: Once | CUTANEOUS | Status: DC

## 2014-04-18 MED ORDER — OXYCODONE HCL 5 MG PO CAPS: 5.0000 mg | ORAL_CAPSULE | ORAL | Status: DC | PRN

## 2014-04-18 MED ORDER — 0.9 % SODIUM CHLORIDE (POUR BTL) OPTIME
TOPICAL | Status: DC | PRN
  Administered 2014-04-18: 1000 mL

## 2014-04-18 MED ORDER — LIDOCAINE HCL (PF) 1 % IJ SOLN
INTRAMUSCULAR | Status: AC
  Filled 2014-04-18: qty 30

## 2014-04-18 MED ORDER — THROMBIN 20000 UNITS EX SOLR
CUTANEOUS | Status: AC
  Filled 2014-04-18: qty 20000

## 2014-04-18 MED ORDER — LIDOCAINE HCL 1 % IJ SOLN
INTRAMUSCULAR | Status: DC | PRN
  Administered 2014-04-18: 30 mL

## 2014-04-18 MED ORDER — LIDOCAINE-EPINEPHRINE (PF) 1 %-1:200000 IJ SOLN
INTRAMUSCULAR | Status: AC
  Filled 2014-04-18: qty 10

## 2014-04-18 MED ORDER — DEXTROSE 5 % IV SOLN
10.0000 mg | INTRAVENOUS | Status: DC | PRN
  Administered 2014-04-18: 20 ug via INTRAVENOUS

## 2014-04-18 MED ORDER — ONDANSETRON HCL 4 MG/2ML IJ SOLN
INTRAMUSCULAR | Status: AC
  Filled 2014-04-18: qty 2

## 2014-04-18 MED ORDER — HYDROMORPHONE HCL PF 1 MG/ML IJ SOLN
INTRAMUSCULAR | Status: AC
  Administered 2014-04-18: 0.25 mg via INTRAVENOUS
  Filled 2014-04-18: qty 1

## 2014-04-18 MED ORDER — DEXTROSE 5 % IV SOLN
1.5000 g | INTRAVENOUS | Status: DC | PRN
  Administered 2014-04-18: 1.5 g via INTRAVENOUS

## 2014-04-18 MED ORDER — SODIUM CHLORIDE 0.9 % IV SOLN
INTRAVENOUS | Status: DC
  Administered 2014-04-18 (×2): via INTRAVENOUS

## 2014-04-18 MED ORDER — PROTAMINE SULFATE 10 MG/ML IV SOLN
INTRAVENOUS | Status: DC | PRN
  Administered 2014-04-18: 40 mg via INTRAVENOUS

## 2014-04-18 MED ORDER — HEPARIN SODIUM (PORCINE) 1000 UNIT/ML IJ SOLN
INTRAMUSCULAR | Status: DC | PRN
  Administered 2014-04-18: 7000 mL via INTRAVENOUS

## 2014-04-18 MED ORDER — PROPOFOL 10 MG/ML IV BOLUS
INTRAVENOUS | Status: DC | PRN
  Administered 2014-04-18: 120 mg via INTRAVENOUS

## 2014-04-18 MED ORDER — FENTANYL CITRATE 0.05 MG/ML IJ SOLN
INTRAMUSCULAR | Status: DC | PRN
  Administered 2014-04-18 (×3): 50 ug via INTRAVENOUS

## 2014-04-18 MED ORDER — HEPARIN SODIUM (PORCINE) 5000 UNIT/ML IJ SOLN
INTRAMUSCULAR | Status: DC | PRN
  Administered 2014-04-18: 08:00:00

## 2014-04-18 MED ORDER — HYDROMORPHONE HCL PF 1 MG/ML IJ SOLN
0.2500 mg | INTRAMUSCULAR | Status: DC | PRN
  Administered 2014-04-18 (×2): 0.25 mg via INTRAVENOUS

## 2014-04-18 SURGICAL SUPPLY — 42 items
ADH SKN CLS APL DERMABOND .7 (GAUZE/BANDAGES/DRESSINGS) ×2
CANISTER SUCTION 2500CC (MISCELLANEOUS) ×3 IMPLANT
CLIP TI MEDIUM 24 (CLIP) ×3 IMPLANT
CLIP TI WIDE RED SMALL 24 (CLIP) ×3 IMPLANT
COVER PROBE W GEL 5X96 (DRAPES) ×3 IMPLANT
COVER SURGICAL LIGHT HANDLE (MISCELLANEOUS) ×3 IMPLANT
DECANTER SPIKE VIAL GLASS SM (MISCELLANEOUS) ×3 IMPLANT
DERMABOND ADVANCED (GAUZE/BANDAGES/DRESSINGS) ×4
DERMABOND ADVANCED .7 DNX12 (GAUZE/BANDAGES/DRESSINGS) ×1 IMPLANT
DRAIN PENROSE 1/2X12 LTX STRL (WOUND CARE) IMPLANT
ELECT REM PT RETURN 9FT ADLT (ELECTROSURGICAL) ×3
ELECTRODE REM PT RTRN 9FT ADLT (ELECTROSURGICAL) ×1 IMPLANT
GLOVE BIO SURGEON STRL SZ 6.5 (GLOVE) ×1 IMPLANT
GLOVE BIO SURGEON STRL SZ7.5 (GLOVE) ×3 IMPLANT
GLOVE BIO SURGEONS STRL SZ 6.5 (GLOVE) ×1
GLOVE BIOGEL PI IND STRL 6.5 (GLOVE) IMPLANT
GLOVE BIOGEL PI IND STRL 7.0 (GLOVE) IMPLANT
GLOVE BIOGEL PI IND STRL 8 (GLOVE) ×1 IMPLANT
GLOVE BIOGEL PI INDICATOR 6.5 (GLOVE) ×2
GLOVE BIOGEL PI INDICATOR 7.0 (GLOVE) ×4
GLOVE BIOGEL PI INDICATOR 8 (GLOVE) ×4
GLOVE ECLIPSE 6.5 STRL STRAW (GLOVE) ×2 IMPLANT
GLOVE SURG SS PI 7.0 STRL IVOR (GLOVE) ×4 IMPLANT
GOWN STRL REUS W/ TWL LRG LVL3 (GOWN DISPOSABLE) ×3 IMPLANT
GOWN STRL REUS W/ TWL XL LVL3 (GOWN DISPOSABLE) IMPLANT
GOWN STRL REUS W/TWL LRG LVL3 (GOWN DISPOSABLE) ×9
GOWN STRL REUS W/TWL XL LVL3 (GOWN DISPOSABLE) ×6
KIT BASIN OR (CUSTOM PROCEDURE TRAY) ×3 IMPLANT
KIT ROOM TURNOVER OR (KITS) ×3 IMPLANT
NS IRRIG 1000ML POUR BTL (IV SOLUTION) ×3 IMPLANT
PACK CV ACCESS (CUSTOM PROCEDURE TRAY) ×3 IMPLANT
PAD ARMBOARD 7.5X6 YLW CONV (MISCELLANEOUS) ×6 IMPLANT
SPONGE SURGIFOAM ABS GEL 100 (HEMOSTASIS) IMPLANT
SUT PROLENE 6 0 BV (SUTURE) ×3 IMPLANT
SUT SILK 2 0 SH (SUTURE) ×1 IMPLANT
SUT VIC AB 3-0 SH 27 (SUTURE) ×9
SUT VIC AB 3-0 SH 27X BRD (SUTURE) ×1 IMPLANT
SUT VICRYL 4-0 PS2 18IN ABS (SUTURE) ×7 IMPLANT
TOWEL OR 17X24 6PK STRL BLUE (TOWEL DISPOSABLE) ×3 IMPLANT
TOWEL OR 17X26 10 PK STRL BLUE (TOWEL DISPOSABLE) ×3 IMPLANT
UNDERPAD 30X30 INCONTINENT (UNDERPADS AND DIAPERS) ×3 IMPLANT
WATER STERILE IRR 1000ML POUR (IV SOLUTION) ×3 IMPLANT

## 2014-04-18 NOTE — Interval H&P Note (Signed)
History and Physical Interval Note:  04/18/2014 7:37 AM  [name]  has presented today for surgery, with the diagnosis of Mechanical complication of other vascular device, implant, and graft  The various methods of treatment have been discussed with the patient and family. After consideration of risks, benefits and other options for treatment, the patient has consented to  Procedure(s): BASILIC VEIN TRANSPOSITION (Right) as a surgical intervention .  The patient's history has been reviewed, patient examined, no change in status, stable for surgery.  I have reviewed the patient's chart and labs.  Questions were answered to the patient's satisfaction.     Khyran Riera S

## 2014-04-18 NOTE — Progress Notes (Signed)
Pt c/o a lot of numbness in right hand. Pt still has a good cap refill, +2 radial pulse, and good bruit and thrill in right hand. She can also feel me touching her fingers. Notified Dr.Dickson in [place]. States he will come by and see pt after he finishes that case. Will cont to monitor closely

## 2014-04-18 NOTE — Anesthesia Preprocedure Evaluation (Signed)
Anesthesia Evaluation  Patient identified by MRN, date of birth, ID band Patient awake    Reviewed: Allergy & Precautions, H&P , NPO status , Patient's Chart, lab work & pertinent test results  Airway       Dental   Pulmonary sleep apnea , former smoker,          Cardiovascular hypertension, + angina + CAD, + Past MI, + Peripheral Vascular Disease and +CHF     Neuro/Psych  Headaches, CVA    GI/Hepatic   Endo/Other  diabetes, Type 1, Insulin DependentHypothyroidism   Renal/GU ESRF and DialysisRenal disease     Musculoskeletal   Abdominal   Peds  Hematology   Anesthesia Other Findings   Reproductive/Obstetrics                           Anesthesia Physical Anesthesia Plan  ASA: III  Anesthesia Plan: General   Post-op Pain Management:    Induction: Intravenous  Airway Management Planned: LMA and Oral ETT  Additional Equipment:   Intra-op Plan:   Post-operative Plan: Extubation in OR  Informed Consent: I have reviewed the patients History and Physical, chart, labs and discussed the procedure including the risks, benefits and alternatives for the proposed anesthesia with the patient or authorized representative who has indicated his/her understanding and acceptance.     Plan Discussed with: CRNA, Anesthesiologist and Surgeon  Anesthesia Plan Comments:         Anesthesia Quick Evaluation

## 2014-04-18 NOTE — Transfer of Care (Signed)
Immediate Anesthesia Transfer of Care Note  Patient: [name]  Procedure(s) Performed: Procedure(s): BASILIC VEIN TRANSPOSITION (Right)  Patient Location: PACU  Anesthesia Type:General  Level of Consciousness: sedated, patient cooperative and responds to stimulation  Airway & Oxygen Therapy: Patient Spontanous Breathing and Patient connected to face mask oxygen  Post-op Assessment: Report given to PACU RN, Post -op Vital signs reviewed and stable and Patient moving all extremities X 4  Post vital signs: Reviewed and stable  Complications: No apparent anesthesia complications

## 2014-04-18 NOTE — Progress Notes (Signed)
BP= 88/42. MD at bedside and aware. Preop pressures low as well per MD because pt took BP meds this morning. Pt is arousable and oriented. MD does not wish to treat BP unless there is a change in pt's mentation. Will cont to monitor closely

## 2014-04-18 NOTE — Telephone Encounter (Signed)
Message copied by Gena Fray on Fri Apr 18, 2014  4:13 PM ------      Message from: Mena Goes      Created: Fri Apr 18, 2014 10:17 AM      Regarding: Schedule                   ----- Message -----         From: Alvia Grove, PA-C         Sent: 04/18/2014   9:51 AM           To: [facility] Charge Pool            S/p right BVT 04/18/14            F/u with Dr. Scot Dock in 6 weeks.             Thanks,       Maudie Mercury ------

## 2014-04-18 NOTE — H&P (View-Only) (Signed)
[facility] POST [facility]PERATIVE OFFICE NOTE  CC:  F/u for non maturing left BVT  HPI:  This is a 79 y.o. female who is s/p left 2nd stage BVT on 12/17/13, fistulogram on 02/10/14, and diatek catheter placement on 03/04/14 by Dr. Bridgett Larsson.    At the time of the fistulogram, she was found to have a widely patent fistula with an area of concern seen on duplex that likely represented a valve site.  There was minimal stenosis noted at that area with some mild irregularity.  She presents today with follow up duplex.   She denies any hx of steal sx.   She is on Brilinta after having an MI a couple of years ago.    Allergies  Allergen Reactions  . Macrobid [Nitrofurantoin Macrocrystal] Anaphylaxis and Rash  . Ace Inhibitors Other (See Comments)    hyperkalemia  . Angiotensin Receptor Blockers Other (See Comments)    hyperkalemia  . Influenza Vaccines Itching, Rash and Other (See Comments)    blisters    Current Outpatient Prescriptions  Medication Sig Dispense Refill  . acetaminophen (TYLENOL) 500 MG tablet Take 1,000 mg by mouth 3 (three) times daily as needed for mild pain.       Marland Kitchen albuterol (PROVENTIL HFA;VENTOLIN HFA) 108 (90 BASE) MCG/ACT inhaler Inhale 2 puffs into the lungs every 6 (six) hours as needed for wheezing or shortness of breath.      Marland Kitchen albuterol (PROVENTIL) (2.5 MG/3ML) 0.083% nebulizer solution Take 2.5 mg by nebulization every 6 (six) hours as needed for wheezing or shortness of breath.       Marland Kitchen aspirin EC 81 MG tablet Take 81 mg by mouth every evening.      . calcitRIOL (ROCALTROL) 0.25 MCG capsule Take 0.25 mcg by mouth 3 (three) times a week. Sunday, Wednesday, and Friday      . gabapentin (NEURONTIN) 400 MG capsule Take 400 mg by mouth 3 (three) times daily.      . insulin aspart (NOVOLOG) 100 UNIT/ML injection CBG 70 - 120: 0 units CBG 121 - 150: 2 units CBG 151 - 200: 3 units CBG 201 - 250: 5 units CBG 251 - 300: 8 units CBG 301 - 350: 11 units CBG 351 -  400: 15 units  10 mL  2  . insulin glargine (LANTUS) 100 UNIT/ML injection Inject 0.4 mLs (40 Units total) into the skin at bedtime.  10 mL  2  . isosorbide mononitrate (IMDUR) 60 MG 24 hr tablet Take 120 mg by mouth daily.       Marland Kitchen levothyroxine (SYNTHROID, LEVOTHROID) 88 MCG tablet Take 88 mcg by mouth daily before breakfast.      . metolazone (ZAROXOLYN) 10 MG tablet Take 1 tablet (10 mg total) by mouth daily.  30 tablet  1  . metoprolol (LOPRESSOR) 50 MG tablet Take 1 tablet (50 mg total) by mouth 2 (two) times daily.  60 tablet  1  . nitroGLYCERIN (NITROSTAT) 0.4 MG SL tablet Place 0.4 mg under the tongue every 5 (five) minutes as needed for chest pain.      . Nutritional Supplements (FEEDING SUPPLEMENT, NEPRO CARB STEADY,) LIQD Take 237 mLs by mouth as needed (missed meal during dialysis.).    0  . nystatin (MYCOSTATIN/NYSTOP) 100000 UNIT/GM POWD Apply 1 g topically every morning.      . Omega-3 Fatty Acids (FISH OIL) 1000 MG CAPS Take 1,000 mg by mouth 2 (two) times daily.      Marland Kitchen  pravastatin (PRAVACHOL) 20 MG tablet Take 10 mg by mouth daily.       . ranolazine (RANEXA) 500 MG 12 hr tablet Take 500 mg by mouth 2 (two) times daily.      . Ticagrelor (BRILINTA) 90 MG TABS tablet Take 1 tablet (90 mg total) by mouth 2 (two) times daily.  60 tablet     No current facility-administered medications for this visit.   Family History  Problem Relation Age of Onset  . Diabetes Sister   . Heart disease Sister     before age 68  . Hyperlipidemia Sister   . Hypertension Sister   . Diabetes Sister   . Hyperlipidemia Sister   . Hypertension Sister    ROS:  See HPI  Physical Exam:  Filed Vitals:   04/02/14 1453  BP: 108/54  Pulse: 73   General:  NAD Incision:  Are well healed Extremities:  + palpable bilateral radial pulses; + thrill in left BVT, but is difficult to palpate Cardiac:  RRR Lungs:  CTAB Abdomen:  Obese, non tender to palpation  Neck:  No carotid bruits are heard; right  IJ diatek catheter is in place.  Right arm vein mapping 04/02/14: Diameters of right basilic vein range from 9.45-0.3UU  Diameters of right cephalic vein range from 8.28-0.03KJ  1.  Patent and compressible right basilic vein  2.  Right cephalic vein is not visualized at the distal upper arm to proximal forearm, with small caliber of vessel 3.  A branch is visualized arising off the mid upper arm cephalic vein  A/P:  This is a 79 y.o. female here for f/u after left 2nd stage BVT on 12/17/13 and fistuolgram on 02/10/14.  She had a diatek catheter placed on 03/04/14 by Dr. Bridgett Larsson.  -her left upper arm BVT is poorly maturing.  She did have vein map today of the right arm and the right basilic vein appears to be adequate for a right BVT, which we will proceed with on 04/18/14 by Dr. Scot Dock -she is on Brilinta-will discontinue Brilinta 5 days prior to procedure   [name], PA-C [facility] [phone]  Clinic MD:  Pt seen and examined with Dr. Scot Dock  Agree with above. The left upper arm fistula has a very weak thrill and bruit. I do not think that this is salvageable. Therefore I would recommend new access. Based on her vein mapping today, it appears that the forearm and upper arm cephalic veins are marginal but there is a small chance that they would be adequate. Her basilic vein looks reasonable in size and this too may be an option. If none of her veins are adequate then she would require placement of an AV graft. Her surgery has been scheduled for 04/18/2014.  [name], MD, [place] [phone] 04/02/2014

## 2014-04-18 NOTE — Progress Notes (Signed)
Dr.Dickson at bedside to assess pt--states pt is ok to go home

## 2014-04-18 NOTE — Op Note (Signed)
    NAME: [name]   MRN: [id number] DOB: 1935/07/10    DATE OF OPERATION: 04/18/2014  PREOP DIAGNOSIS: Stage V chronic kidney disease  POSTOP DIAGNOSIS: Same  PROCEDURE: Right basilic vein transposition  SURGEON: Judeth Cornfield. Scot Dock, MD, FACS  ASSIST: Silva Bandy, [facility]  ANESTHESIA: Gen.   EBL: minimal  INDICATIONS: [name] is a 79 y.o. female who presents for new access.  FINDINGS: the basilic vein looked reasonable.  TECHNIQUE: The patient was taken to the operating room and received a general anesthetic. The right upper extremity was prepped and draped in usual sterile fashion. Using 3 incisions along the medial aspect of the right upper arm, the basilic vein was harvested from below the antecubital level to the axilla. Branches were divided between clips and 3-0 silk ties. Through the distal incision, the brachial artery was dissected free beneath the fascia. There was a more superficial smaller artery so it is possible that the patient has a high bifurcation of the brachial artery. A tunnel was created from the distal incision to the axillary incision and the vein was distended and then brought through the tunnel. The patient was heparinized. The brachial artery was clamped proximally and distally and a longitudinal arteriotomy was made. The vein was spatulated and sewn end to side to the artery using continuous 6-0 Prolene suture. At the completion was a good thrill in the fistula. There was a palpable radial pulse. Hemostasis was obtained in the wounds. The wounds were closed with deep layer 3-0 Vicryl and the skin closed with 4-0 Vicryl. Dermabond was applied. The patient tolerated the procedure well and was transferred to the recovery room in stable condition. All needle and sponge counts were correct.  [name], MD, FACS [facility]  DATE OF DICTATION:   04/18/2014

## 2014-04-18 NOTE — Progress Notes (Signed)
Dr. Tamala Julian notified of blood pressure no further orders given.

## 2014-04-18 NOTE — Discharge Instructions (Signed)
What to eat: ° °For your first meals, you should eat lightly; only small meals initially.  If you do not have nausea, you may eat larger meals.  Avoid spicy, greasy and heavy food.   ° °General Anesthesia, Adult, Care After  °Refer to this sheet in the next few weeks. These instructions provide you with information on caring for yourself after your procedure. Your health care provider may also give you more specific instructions. Your treatment has been planned according to current medical practices, but problems sometimes occur. Call your health care provider if you have any problems or questions after your procedure.  °WHAT TO EXPECT AFTER THE PROCEDURE  °After the procedure, it is typical to experience:  °Sleepiness.  °Nausea and vomiting. °HOME CARE INSTRUCTIONS  °For the first 24 hours after general anesthesia:  °Have a responsible person with you.  °Do not drive a car. If you are alone, do not take public transportation.  °Do not drink alcohol.  °Do not take medicine that has not been prescribed by your health care provider.  °Do not sign important papers or make important decisions.  °You may resume a normal diet and activities as directed by your health care provider.  °Change bandages (dressings) as directed.  °If you have questions or problems that seem related to general anesthesia, call the hospital and ask for the anesthetist or anesthesiologist on call. °SEEK MEDICAL CARE IF:  °You have nausea and vomiting that continue the day after anesthesia.  °You develop a rash. °SEEK IMMEDIATE MEDICAL CARE IF:  °You have difficulty breathing.  °You have chest pain.  °You have any allergic problems. °Document Released: 11/21/2000 Document Revised: 04/17/2013 Document Reviewed: 02/28/2013  °ExitCare® Patient Information ©2014 [employer].  ° °Tissue Adhesive Wound Care  ° ° °Some cuts and wounds can be closed with tissue adhesive. Adhesive is like glue. It holds the skin together and helps a wound heal faster.  This adhesive goes away on its own as the wound heals.  °HOME CARE  °Showers are allowed. Do not soak the wound in water. Do not take baths, swim, or use hot tubs. Do not use soaps or creams on your wound.  °If a bandage (dressing) was put on, change it as often as told by your doctor.  °Keep the bandage dry.  °Do not scratch, pick, or rub the adhesive.  °Do not put tape over the adhesive. The adhesive could come off.  °Protect the wound from another injury.  °Protect the wound from sun and tanning beds.  °Only take medicine as told by your doctor.  °Keep all doctor visits as told. °GET HELP RIGHT AWAY IF:  °Your wound is red, puffy (swollen), hot, or tender.  °You get a rash after the glue is put on.  °You have more pain in the wound.  °You have a red streak going away from the wound.  °You have yellowish-white fluid (pus) coming from the wound.  °You have more bleeding.  °You have a fever.  °You have chills and start to shake.  °You notice a bad smell coming from the wound.  °Your wound or adhesive breaks open. °MAKE SURE YOU:  °Understand these instructions.  °Will watch your condition.  °Will get help right away if you are not doing well or get worse. °Document Released: 05/24/2008 Document Revised: 06/05/2013 Document Reviewed: 03/06/2013  °ExitCare® Patient Information ©2015 [employer]. This information is not intended to replace advice given to you by your health care provider.   Make sure you discuss any questions you have with your health care provider.  ° ° °

## 2014-04-18 NOTE — Telephone Encounter (Signed)
Spoke with pt to schedule, dpm °

## 2014-04-18 NOTE — Anesthesia Postprocedure Evaluation (Signed)
  Anesthesia Post-op Note  Patient: [name]  Procedure(s) Performed: Procedure(s): BASILIC VEIN TRANSPOSITION (Right)  Patient Location: PACU  Anesthesia Type:General  Level of Consciousness: awake, alert , oriented and patient cooperative  Airway and Oxygen Therapy: Patient Spontanous Breathing  Post-op Pain: mild  Post-op Assessment: Post-op Vital signs reviewed, Patient's Cardiovascular Status Stable, Respiratory Function Stable, Patent Airway and No signs of Nausea or vomiting  Post-op Vital Signs: stable  Last Vitals:  Filed Vitals:   04/18/14 1308  BP:   Pulse:   Temp: 36.6 C  Resp:     Complications: No apparent anesthesia complications

## 2014-04-19 ENCOUNTER — Inpatient Hospital Stay (HOSPITAL_COMMUNITY): Payer: Medicare Other | Admitting: Anesthesiology

## 2014-04-19 ENCOUNTER — Encounter (HOSPITAL_COMMUNITY): Payer: Medicare Other | Admitting: Anesthesiology

## 2014-04-19 ENCOUNTER — Ambulatory Visit (HOSPITAL_COMMUNITY)
Admission: RE | Admit: 2014-04-19 | Discharge: 2014-04-19 | Disposition: A | Discharge status: Home / Self Care | Payer: Medicare Other | Source: Other Acute Inpatient Hospital | Attending: Surgery | Admitting: Surgery

## 2014-04-19 ENCOUNTER — Inpatient Hospital Stay: Admit: 2014-04-19 | Payer: Self-pay | Admitting: Surgery

## 2014-04-19 ENCOUNTER — Encounter (HOSPITAL_COMMUNITY)
Admission: RE | Disposition: A | Discharge status: Home / Self Care | Payer: Self-pay | Source: Other Acute Inpatient Hospital | Attending: Surgery

## 2014-04-19 ENCOUNTER — Encounter (HOSPITAL_COMMUNITY): Payer: Self-pay | Admitting: Anesthesiology

## 2014-04-19 DIAGNOSIS — Z794 Long term (current) use of insulin: Secondary | ICD-10-CM | POA: Insufficient documentation

## 2014-04-19 DIAGNOSIS — N186 End stage renal disease: Secondary | ICD-10-CM | POA: Diagnosis not present

## 2014-04-19 DIAGNOSIS — G458 Other transient cerebral ischemic attacks and related syndromes: Secondary | ICD-10-CM

## 2014-04-19 DIAGNOSIS — E109 Type 1 diabetes mellitus without complications: Secondary | ICD-10-CM | POA: Insufficient documentation

## 2014-04-19 DIAGNOSIS — I509 Heart failure, unspecified: Secondary | ICD-10-CM | POA: Insufficient documentation

## 2014-04-19 DIAGNOSIS — I120 Hypertensive chronic kidney disease with stage 5 chronic kidney disease or end stage renal disease: Secondary | ICD-10-CM | POA: Insufficient documentation

## 2014-04-19 DIAGNOSIS — I2510 Atherosclerotic heart disease of native coronary artery without angina pectoris: Secondary | ICD-10-CM | POA: Insufficient documentation

## 2014-04-19 DIAGNOSIS — Y841 Kidney dialysis as the cause of abnormal reaction of the patient, or of later complication, without mention of misadventure at the time of the procedure: Secondary | ICD-10-CM | POA: Insufficient documentation

## 2014-04-19 DIAGNOSIS — Z87891 Personal history of nicotine dependence: Secondary | ICD-10-CM | POA: Insufficient documentation

## 2014-04-19 DIAGNOSIS — M109 Gout, unspecified: Secondary | ICD-10-CM | POA: Insufficient documentation

## 2014-04-19 DIAGNOSIS — E039 Hypothyroidism, unspecified: Secondary | ICD-10-CM | POA: Insufficient documentation

## 2014-04-19 DIAGNOSIS — T82898A Other specified complication of vascular prosthetic devices, implants and grafts, initial encounter: Secondary | ICD-10-CM | POA: Diagnosis not present

## 2014-04-19 DIAGNOSIS — Z992 Dependence on renal dialysis: Secondary | ICD-10-CM | POA: Insufficient documentation

## 2014-04-19 DIAGNOSIS — I739 Peripheral vascular disease, unspecified: Secondary | ICD-10-CM | POA: Insufficient documentation

## 2014-04-19 DIAGNOSIS — Z8673 Personal history of transient ischemic attack (TIA), and cerebral infarction without residual deficits: Secondary | ICD-10-CM | POA: Insufficient documentation

## 2014-04-19 DIAGNOSIS — I252 Old myocardial infarction: Secondary | ICD-10-CM | POA: Insufficient documentation

## 2014-04-19 DIAGNOSIS — E785 Hyperlipidemia, unspecified: Secondary | ICD-10-CM | POA: Insufficient documentation

## 2014-04-19 DIAGNOSIS — Y9210 Unspecified residential institution as the place of occurrence of the external cause: Secondary | ICD-10-CM | POA: Insufficient documentation

## 2014-04-19 HISTORY — PX: LIGATION OF ARTERIOVENOUS  FISTULA: SHX5948

## 2014-04-19 LAB — GLUCOSE, CAPILLARY: Glucose-Capillary: 195 mg/dL — ABNORMAL HIGH (ref 70–99)

## 2014-04-19 SURGERY — LIGATION OF ARTERIOVENOUS  FISTULA
Anesthesia: General | Site: Arm Upper | Laterality: Right

## 2014-04-19 MED ORDER — MIDAZOLAM HCL 2 MG/2ML IJ SOLN
INTRAMUSCULAR | Status: AC
  Filled 2014-04-19: qty 2

## 2014-04-19 MED ORDER — ONDANSETRON HCL 4 MG/2ML IJ SOLN
INTRAMUSCULAR | Status: DC | PRN
  Administered 2014-04-19: 4 mg via INTRAVENOUS

## 2014-04-19 MED ORDER — SODIUM CHLORIDE 0.9 % IV SOLN
INTRAVENOUS | Status: DC | PRN
  Administered 2014-04-19: 20:00:00 via INTRAVENOUS

## 2014-04-19 MED ORDER — DEXTROSE 5 % IV SOLN
INTRAVENOUS | Status: DC | PRN
  Administered 2014-04-19: 21:00:00 via INTRAVENOUS

## 2014-04-19 MED ORDER — LIDOCAINE-EPINEPHRINE (PF) 1 %-1:200000 IJ SOLN
INTRAMUSCULAR | Status: DC | PRN
  Administered 2014-04-19: 17 mL via INTRADERMAL

## 2014-04-19 MED ORDER — SUCCINYLCHOLINE CHLORIDE 20 MG/ML IJ SOLN
INTRAMUSCULAR | Status: AC
  Filled 2014-04-19: qty 1

## 2014-04-19 MED ORDER — CEFAZOLIN SODIUM-DEXTROSE 2-3 GM-% IV SOLR
INTRAVENOUS | Status: AC
  Filled 2014-04-19: qty 50

## 2014-04-19 MED ORDER — ARTIFICIAL TEARS OP OINT
TOPICAL_OINTMENT | OPHTHALMIC | Status: AC
  Filled 2014-04-19: qty 3.5

## 2014-04-19 MED ORDER — 0.9 % SODIUM CHLORIDE (POUR BTL) OPTIME
TOPICAL | Status: DC | PRN
  Administered 2014-04-19: 1000 mL

## 2014-04-19 MED ORDER — HYDROMORPHONE HCL PF 1 MG/ML IJ SOLN: 0.2500 mg | INTRAMUSCULAR | Status: DC | PRN

## 2014-04-19 MED ORDER — CEFAZOLIN SODIUM-DEXTROSE 2-3 GM-% IV SOLR
INTRAVENOUS | Status: DC | PRN
  Administered 2014-04-19: 2 g via INTRAVENOUS

## 2014-04-19 MED ORDER — PROPOFOL 10 MG/ML IV BOLUS
INTRAVENOUS | Status: AC
  Filled 2014-04-19: qty 20

## 2014-04-19 MED ORDER — PROPOFOL 10 MG/ML IV BOLUS
INTRAVENOUS | Status: DC | PRN
  Administered 2014-04-19 (×2): 20 mg via INTRAVENOUS

## 2014-04-19 MED ORDER — FENTANYL CITRATE 0.05 MG/ML IJ SOLN
INTRAMUSCULAR | Status: AC
  Filled 2014-04-19: qty 5

## 2014-04-19 MED ORDER — LIDOCAINE-EPINEPHRINE (PF) 1 %-1:200000 IJ SOLN
INTRAMUSCULAR | Status: AC
  Filled 2014-04-19: qty 10

## 2014-04-19 MED ORDER — ONDANSETRON HCL 4 MG/2ML IJ SOLN: 4.0000 mg | Freq: Once | INTRAMUSCULAR | Status: DC | PRN

## 2014-04-19 MED ORDER — FENTANYL CITRATE 0.05 MG/ML IJ SOLN
INTRAMUSCULAR | Status: DC | PRN
  Administered 2014-04-19: 50 ug via INTRAVENOUS

## 2014-04-19 SURGICAL SUPPLY — 38 items
ADH SKN CLS APL DERMABOND .7 (GAUZE/BANDAGES/DRESSINGS) ×1
BLADE 10 SAFETY STRL DISP (BLADE) ×1 IMPLANT
CANISTER SUCTION 2500CC (MISCELLANEOUS) ×3 IMPLANT
CLIP TI MEDIUM 6 (CLIP) ×3 IMPLANT
CLIP TI WIDE RED SMALL 6 (CLIP) ×3 IMPLANT
COVER SURGICAL LIGHT HANDLE (MISCELLANEOUS) ×3 IMPLANT
DERMABOND ADVANCED (GAUZE/BANDAGES/DRESSINGS) ×2
DERMABOND ADVANCED .7 DNX12 (GAUZE/BANDAGES/DRESSINGS) ×1 IMPLANT
ELECT REM PT RETURN 9FT ADLT (ELECTROSURGICAL) ×3
ELECTRODE REM PT RTRN 9FT ADLT (ELECTROSURGICAL) ×1 IMPLANT
GEL ULTRASOUND 20GR AQUASONIC (MISCELLANEOUS) ×2 IMPLANT
GLOVE BIOGEL PI IND STRL 7.5 (GLOVE) ×1 IMPLANT
GLOVE BIOGEL PI INDICATOR 7.5 (GLOVE) ×6
GLOVE SURG SS PI 7.5 STRL IVOR (GLOVE) ×9 IMPLANT
GOWN STRL REUS W/ TWL LRG LVL3 (GOWN DISPOSABLE) ×2 IMPLANT
GOWN STRL REUS W/ TWL XL LVL3 (GOWN DISPOSABLE) ×1 IMPLANT
GOWN STRL REUS W/TWL LRG LVL3 (GOWN DISPOSABLE) ×6
GOWN STRL REUS W/TWL XL LVL3 (GOWN DISPOSABLE) ×3
HEMOSTAT SNOW SURGICEL 2X4 (HEMOSTASIS) IMPLANT
KIT BASIN OR (CUSTOM PROCEDURE TRAY) ×3 IMPLANT
KIT ROOM TURNOVER OR (KITS) ×3 IMPLANT
NS IRRIG 1000ML POUR BTL (IV SOLUTION) ×3 IMPLANT
PACK CV ACCESS (CUSTOM PROCEDURE TRAY) ×3 IMPLANT
PAD ARMBOARD 7.5X6 YLW CONV (MISCELLANEOUS) ×6 IMPLANT
SUT ETHILON 3 0 PS 1 (SUTURE) IMPLANT
SUT PROLENE 6 0 BV (SUTURE) IMPLANT
SUT SILK 0 TIES 10X30 (SUTURE) ×1 IMPLANT
SUT VIC AB 2-0 CT1 27 (SUTURE) ×3
SUT VIC AB 2-0 CT1 TAPERPNT 27 (SUTURE) IMPLANT
SUT VIC AB 3-0 SH 27 (SUTURE) ×3
SUT VIC AB 3-0 SH 27X BRD (SUTURE) ×1 IMPLANT
SUT VICRYL 4-0 PS2 18IN ABS (SUTURE) ×2 IMPLANT
SWAB COLLECTION DEVICE MRSA (MISCELLANEOUS) IMPLANT
TOWEL OR 17X24 6PK STRL BLUE (TOWEL DISPOSABLE) ×3 IMPLANT
TOWEL OR 17X26 10 PK STRL BLUE (TOWEL DISPOSABLE) ×3 IMPLANT
TUBE ANAEROBIC SPECIMEN COL (MISCELLANEOUS) IMPLANT
UNDERPAD 30X30 INCONTINENT (UNDERPADS AND DIAPERS) ×3 IMPLANT
WATER STERILE IRR 1000ML POUR (IV SOLUTION) ×3 IMPLANT

## 2014-04-19 NOTE — Anesthesia Postprocedure Evaluation (Signed)
  Anesthesia Post-op Note  Patient: [name]  Procedure(s) Performed: Procedure(s): LIGATION OF ARTERIOVENOUS  FISTULA (Right)  Patient Location: PACU  Anesthesia Type:MAC  Level of Consciousness: awake, alert , oriented and patient cooperative  Airway and Oxygen Therapy: Patient Spontanous Breathing  Post-op Pain: none  Post-op Assessment: Post-op Vital signs reviewed, Patient's Cardiovascular Status Stable, Respiratory Function Stable, Patent Airway and No signs of Nausea or vomiting  Post-op Vital Signs: stable  Last Vitals:  Filed Vitals:   04/19/14 2115  BP: 128/43  Pulse: 76  Temp: 36.7 C  Resp: 12    Complications: No apparent anesthesia complications

## 2014-04-19 NOTE — Anesthesia Procedure Notes (Signed)
Procedure Name: MAC Date/Time: 04/19/2014 8:35 PM Performed by: Jacquiline Doe A Pre-anesthesia Checklist: Patient identified, Timeout performed, Emergency Drugs available, Suction available and Patient being monitored Patient Re-evaluated:Patient Re-evaluated prior to inductionOxygen Delivery Method: Nasal cannula Intubation Type: IV induction Dental Injury: Teeth and Oropharynx as per pre-operative assessment

## 2014-04-19 NOTE — Op Note (Signed)
    Patient name: [name] MRN: [id number] DOB: 07/13/1935 Sex: female  04/19/2014 Pre-operative Diagnosis: Steal syndrome, right arm Post-operative diagnosis:  Same Surgeon:  Eldridge Abrahams Assistants:  none Procedure:   Ligation of right arm fistula Anesthesia:  MAC Blood Loss:  See anesthesia record Specimens:  none  Findings:  Palpable right radial pulse after ligation  Indications:  The patient has had numbness and pain after her fistula creation.  SHe got to the point today that she could not tolerate the pain and called me to describe her symptoms.  She complained of numbness and pain which has not improved with exercise and narcotics.  She describes a feeling as if ants are biting her hand.  She has weak grip strength and a non-palpable pulse in her radial artery.  Shecame in for ligation  Procedure:  The patient was identified in the holding area and taken to [facility]  The patient was then placed supine on the table. MAC anesthesia was administered.  The patient was prepped and draped in the usual sterile fashion.  A time out was called and antibiotics were administered.  1% lidocaine was administered.  The incision over the anastamosis was opened with a #10 blade.  Sharp disscetion was used to expose the anastamosis.  I ligated the fistula by placing 3 2-0 silk sutures around the vein.  After this, th epatient had a palpable radial pulse.  The incision was then closed in 2 layers of 2-0 vicryl, followed by a 4-0 vicryl  Dermabond was placed.   Disposition:  To PACU in stable condition.   Theotis Burrow, M.D. [facility] [place] Office: [phone] Pager:  [phone]

## 2014-04-19 NOTE — Anesthesia Preprocedure Evaluation (Addendum)
Anesthesia Evaluation  Patient identified by MRN, date of birth, ID band Patient awake    Reviewed: Allergy & Precautions, H&P , NPO status , Patient's Chart, lab work & pertinent test results  Airway       Dental   Pulmonary sleep apnea , former smoker,          Cardiovascular hypertension, + angina + CAD, + Past MI, + Peripheral Vascular Disease and +CHF     Neuro/Psych  Headaches, CVA    GI/Hepatic   Endo/Other  diabetes, Type 1, Insulin Dependent, Oral Hypoglycemic AgentsHypothyroidism   Renal/GU ESRF and DialysisRenal disease     Musculoskeletal   Abdominal   Peds  Hematology   Anesthesia Other Findings   Reproductive/Obstetrics                         Anesthesia Physical Anesthesia Plan  ASA: IV and emergent  Anesthesia Plan: MAC   Post-op Pain Management:    Induction: Intravenous  Airway Management Planned: Mask  Additional Equipment:   Intra-op Plan:   Post-operative Plan: Extubation in OR  Informed Consent: I have reviewed the patients History and Physical, chart, labs and discussed the procedure including the risks, benefits and alternatives for the proposed anesthesia with the patient or authorized representative who has indicated his/her understanding and acceptance.     Plan Discussed with: CRNA, Anesthesiologist and Surgeon  Anesthesia Plan Comments:       Anesthesia Quick Evaluation

## 2014-04-19 NOTE — Transfer of Care (Signed)
Immediate Anesthesia Transfer of Care Note  Patient: [name]  Procedure(s) Performed: Procedure(s): LIGATION OF ARTERIOVENOUS  FISTULA (Right)  Patient Location: PACU  Anesthesia Type:MAC  Level of Consciousness: awake, alert , oriented, patient cooperative and responds to stimulation  Airway & Oxygen Therapy: Patient Spontanous Breathing  Post-op Assessment: Report given to PACU RN, Post -op Vital signs reviewed and stable, Patient moving all extremities and Patient moving all extremities X 4  Post vital signs: Reviewed and stable  Complications: No apparent anesthesia complications

## 2014-04-19 NOTE — H&P (Signed)
Patient name: ANALYSSA DOWNS MRN: [id number] DOB: 1935/01/22 Sex: female    No chief complaint on file.   HISTORY OF PRESENT ILLNESS: The patient is status post right upper arm AV fistula performed yesterday by Dr. Scot Dock.  She called me this afternoon and stated that she was having extreme pain in her hand.  She states that her arm from the elbow down this completely nominal.  She feels as if there are needles poking into her hand.  She tried to wait until Monday to call us but stated that she could not wait any longer.  Pain medicine was not helping.  I told her to come to the admitting office with the intention of proceeding with fistula ligation.  Past Medical History  Diagnosis Date  . Hypertension   . Heart failure December 07, 2012    Right side  . Hypercalcemia     2nd to hyperparathyroidism  . Thyroid disease     Hyperparathyroidism  . Thyroid disease     Hypothyroidism  . Dyslipidemia   . CHF (congestive heart failure)   . Myocardial infarction 10/28/2010  . Peripheral vascular disease   . Short-term memory loss   . Stroke 2010ish    no residual  . Shingles 2010 ish  . Anginal pain   . Coronary artery disease   . Diabetes mellitus without complication     TYPE 2  . Shortness of breath   . Parathyroid cyst   . Headache(784.0)     CALCIFICATION LT SKULL  . Arthritis     Gout  . Cancer     kidney left removed  . Sleep apnea     Recently test in [place](NEG) NO CPAP   . ESRD (end stage renal disease)     dialysis T-TH-SAT  . Hypothyroidism     Past Surgical History  Procedure Laterality Date  . Nephrectomy Left 2005  . Abdominal hysterectomy  2005  . Cholecystectomy  2005  . Cornary stent    . Coronary stent placement Left 10/2010  . Av fistula placement Left 01/09/2013    Procedure: ARTERIOVENOUS (AV) FISTULA CREATION;  Surgeon: Mal Misty, MD;  Location: [place];  Service: Vascular;  Laterality: Left;  Creation of Left Brachial Cephalic  Arteriovenous Fistula  . Bascilic vein transposition Left 07/30/2013    Procedure: 1ST Stage BASCILIC VEIN TRANSPOSITION;  Surgeon: Angelia Mould, MD;  Location: [place];  Service: Vascular;  Laterality: Left;  . Cardiac catheterization      2013 STENT PLACED  . Cataract extraction      BILAT  . Bascilic vein transposition Left 12/17/2013    Procedure: BASILIC VEIN TRANSPOSITION - LEFT ARM 2ND STAGE;  Surgeon: Angelia Mould, MD;  Location: [place];  Service: Vascular;  Laterality: Left;  . Insertion of dialysis catheter Left 03/04/2014    Procedure: INSERTION OF DIALYSIS CATHETER;  Surgeon: Conrad King and Queen, MD;  Location: [place];  Service: Vascular;  Laterality: Left;  Marland Kitchen Eye surgery Bilateral     cataract surgery  . Appendectomy      History   Social History  . Marital Status: Widowed    Spouse Name: N/A    Number of Children: N/A  . Years of Education: N/A   Occupational History  . Not on file.   Social History Main Topics  . Smoking status: Former Smoker -- 46 years    Types: Cigarettes    Quit date: 01/08/1998  .  Smokeless tobacco: Never Used  . Alcohol Use: No  . Drug Use: No  . Sexual Activity: Not on file   Other Topics Concern  . Not on file   Social History Narrative  . No narrative on file    Family History  Problem Relation Age of Onset  . Diabetes Sister   . Heart disease Sister     before age 47  . Hyperlipidemia Sister   . Hypertension Sister   . Diabetes Sister   . Hyperlipidemia Sister   . Hypertension Sister     Allergies as of 04/19/2014 - Review Complete 04/19/2014  Allergen Reaction Noted  . Macrobid [nitrofurantoin macrocrystal] Anaphylaxis and Rash 01/08/2013  . Ace inhibitors Other (See Comments) 03/01/2014  . Angiotensin receptor blockers Other (See Comments) 03/01/2014  . Influenza vaccines Itching, Rash, and Other (See Comments) 01/08/2013    No current facility-administered medications on file prior to encounter.   Current  Outpatient Prescriptions on File Prior to Encounter  Medication Sig Dispense Refill  . acetaminophen (TYLENOL) 500 MG tablet Take 1,000 mg by mouth 3 (three) times daily as needed for mild pain.       Marland Kitchen albuterol (PROVENTIL HFA;VENTOLIN HFA) 108 (90 BASE) MCG/ACT inhaler Inhale 2 puffs into the lungs every 6 (six) hours as needed for wheezing or shortness of breath.      Marland Kitchen albuterol (PROVENTIL) (2.5 MG/3ML) 0.083% nebulizer solution Take 2.5 mg by nebulization every 6 (six) hours as needed for wheezing or shortness of breath.       Marland Kitchen aspirin 81 MG chewable tablet Chew 81 mg by mouth every evening.      . calcitRIOL (ROCALTROL) 0.25 MCG capsule Take 0.25 mcg by mouth 3 (three) times a week. Sunday, Wednesday, and Friday      . gabapentin (NEURONTIN) 400 MG capsule Take 400 mg by mouth 3 (three) times daily.      . insulin aspart (NOVOLOG) 100 UNIT/ML injection Inject 0-22 Units into the skin 3 (three) times daily with meals. CBG: 0-70= 0 units 71-100=9 units after meal 101-150=14 before meal 151-200=16 units before meal 201-250=17 units before meal 251-300=18 units before meal 301-350=19 units before meal 351-400=20 units before meal Over 400=22 units before meal      . insulin glargine (LANTUS) 100 UNIT/ML injection Inject 40 Units into the skin at bedtime.      . isosorbide mononitrate (IMDUR) 60 MG 24 hr tablet Take 120 mg by mouth daily.       Marland Kitchen levothyroxine (SYNTHROID, LEVOTHROID) 88 MCG tablet Take 88 mcg by mouth daily before breakfast.      . metoprolol (LOPRESSOR) 50 MG tablet Take 50 mg by mouth 2 (two) times daily.      . nitroGLYCERIN (NITROSTAT) 0.4 MG SL tablet Place 0.4 mg under the tongue every 5 (five) minutes as needed for chest pain.      . Omega-3 Fatty Acids (FISH OIL) 1000 MG CAPS Take 1,000 mg by mouth 2 (two) times daily.      Marland Kitchen oxycodone (OXY-IR) 5 MG capsule Take 1 capsule (5 mg total) by mouth every 4 (four) hours as needed.  30 capsule  0  . ranolazine (RANEXA) 500  MG 12 hr tablet Take 500 mg by mouth 2 (two) times daily.      . ticagrelor (BRILINTA) 90 MG TABS tablet Take 90 mg by mouth 2 (two) times daily.         REVIEW OF SYSTEMS: See history of present  illness  PHYSICAL EXAMINATION:   Vital signs are There were no vitals taken for this visit. General: The patient appears their stated age. HEENT:  No gross abnormalities Pulmonary:  Non labored breathing Abdomen: Soft and non-tender Musculoskeletal: There are no major deformities. Neurologic: No focal weakness or paresthesias are detected, Skin: There are no ulcer or rashes noted. Psychiatric: The patient has normal affect. Cardiovascular: There is a regular rate and rhythm without significant murmur appreciated.  Thrill within right arm fistula with a mild amount of ecchymosis.  There is no radial pulse.  The patient has weak grip strength in the right arm which is very cold.     Assessment: Steal syndrome, status post right arm AV fistula Plan: I believe that the patient has steal syndrome from her fistula that was created yesterday.  I do not think that her fistula is salvageable, given the degree of her symptoms.  I don't think she would benefit from banding or any other procedure, other than ligation.  I discussed this extensively with the patient and her family members, and she wishes to proceed.  Eldridge Abrahams, M.D. [facility] Office: [phone] Pager:  [phone]

## 2014-04-20 LAB — POCT I-STAT 4, (NA,K, GLUC, HGB,HCT)
Glucose, Bld: 259 mg/dL — ABNORMAL HIGH (ref 70–99)
HEMATOCRIT: 39 % (ref 36.0–46.0)
HEMOGLOBIN: 13.3 g/dL (ref 12.0–15.0)
POTASSIUM: 3.4 meq/L — AB (ref 3.7–5.3)
Sodium: 137 mEq/L (ref 137–147)

## 2014-04-20 NOTE — Addendum Note (Signed)
Addendum created 04/20/14 1820 by Fidela Juneau, CRNA   Modules edited: Anesthesia Events

## 2014-04-21 ENCOUNTER — Telehealth: Payer: Self-pay

## 2014-04-21 ENCOUNTER — Encounter (HOSPITAL_COMMUNITY): Payer: Self-pay | Admitting: Vascular Surgery

## 2014-04-21 ENCOUNTER — Telehealth: Payer: Self-pay | Admitting: Vascular Surgery

## 2014-04-21 NOTE — Telephone Encounter (Signed)
Phone call from pt.  Reported she continues to have numbness in her thumb and index finger.  Reported her palm is warm and can use all other fingers, but unable to use thumb and index finger.  Discussed with Dr. Trula Slade.  Stated the continued numbness is related to decreased blood flow to pt's fingers for 24 hrs. following her Right BVT.  (Ligation of right AVF was done 8/22)  Advised that this may improve with time.  Will schedule pt. For office appt. To reevaluate for permanent access.

## 2014-04-21 NOTE — Telephone Encounter (Signed)
pt. needs to be r/s for ove with csd to reevalute for perm access placement (R) arm BVT was ligated on 04-19-14 due to Steal Syndrome as per staff message 04-21-14  notified patient of fu appt. with dr. Scot Dock on 05-07-14 at 1:45 pm

## 2014-04-22 ENCOUNTER — Telehealth: Payer: Self-pay | Admitting: Vascular Surgery

## 2014-04-22 NOTE — Telephone Encounter (Signed)
notified patient of appt. with dr. Scot Dock on 05-07-14 1:45

## 2014-05-06 ENCOUNTER — Encounter: Payer: Self-pay | Admitting: Vascular Surgery

## 2014-05-07 ENCOUNTER — Encounter: Payer: Self-pay | Admitting: Vascular Surgery

## 2014-05-07 ENCOUNTER — Ambulatory Visit (INDEPENDENT_AMBULATORY_CARE_PROVIDER_SITE_OTHER): Payer: Medicare Other | Admitting: Vascular Surgery

## 2014-05-07 VITALS — BP 128/62 | HR 88 | Resp 18 | Ht 61.0 in | Wt 192.4 lb

## 2014-05-07 DIAGNOSIS — N186 End stage renal disease: Secondary | ICD-10-CM

## 2014-05-07 NOTE — Assessment & Plan Note (Signed)
I think the next best option would be a left upper extremity AV graft. I don't think she has any options for a fistula remaining. I would be reluctant to place any further axis in the right arm given that she had significant steal in the right upper extremity. I will use a 4-7 mm tapered PTFE graft in the left arm to try to prevent steal however she does understand that this is a risk. We have discussed the procedure potential complications and the office today including but not limited to wound healing problems, bleeding, graft thrombosis, graft infection, and steal syndrome. She dialyzes on Tuesdays Thursdays and Saturdays her surgery has been scheduled for a Friday, 05/23/2014.

## 2014-05-07 NOTE — Progress Notes (Signed)
   Patient name: [name] MRN: [id number] DOB: 19-Jun-1935 Sex: female  REASON FOR VISIT: Evaluate for new hemodialysis access.  HPI: [name] is a 79 y.o. female who is a very complicated vascular access patient. She had a basilic vein transposition done in 2 stages by Dr. Bridgett Larsson. This failed to mature adequately. She underwent a vein mapping and the only adequate vein appeared to be the basilic vein on the right side. She underwent a basilic vein transposition on 04/18/2014 but unfortunately developed a steal syndrome and this was ligated on 04/19/14. She comes in to be evaluated for further access.  She still has some mild paresthesias in the ulnar aspect of her right upper extremity.  REVIEW OF SYSTEMS: Valu.Nieves ] denotes positive finding; [  ] denotes negative finding  CARDIOVASCULAR:  [ ]  chest pain   [ ]  dyspnea on exertion    CONSTITUTIONAL:  [ ]  fever   [ ]  chills  PHYSICAL EXAM: Filed Vitals:   05/07/14 1453  BP: 128/62  Pulse: 88  Resp: 18  Height: 5\' 1"  (1.549 m)  Weight: 192 lb 6.4 oz (87.272 kg)   Body mass index is 36.37 kg/(m^2). GENERAL: The patient is a well-nourished female, in no acute distress. The vital signs are documented above. CARDIOVASCULAR: There is a regular rate and rhythm. PULMONARY: There is good air exchange bilaterally without wheezing or rales. She has a palpable right radial pulse a palpable left radial pulse. She has biphasic radial and ulnar signals on the left side.  MEDICAL ISSUES:  End stage renal disease I think the next best option would be a left upper extremity AV graft. I don't think she has any options for a fistula remaining. I would be reluctant to place any further axis in the right arm given that she had significant steal in the right upper extremity. I will use a 4-7 mm tapered PTFE graft in the left arm to try to prevent steal however she does understand that this is a risk. We have discussed the procedure potential  complications and the office today including but not limited to wound healing problems, bleeding, graft thrombosis, graft infection, and steal syndrome. She dialyzes on Tuesdays Thursdays and Saturdays her surgery has been scheduled for a Friday, 05/23/2014.   [place] [facility] [place] Beeper: [phone]

## 2014-05-13 ENCOUNTER — Other Ambulatory Visit: Payer: Self-pay

## 2014-05-21 ENCOUNTER — Encounter (HOSPITAL_COMMUNITY): Payer: Self-pay | Admitting: Pharmacy Technician

## 2014-05-22 ENCOUNTER — Encounter (HOSPITAL_COMMUNITY): Payer: Self-pay | Admitting: *Deleted

## 2014-05-22 MED ORDER — DEXTROSE 5 % IV SOLN
1.5000 g | INTRAVENOUS | Status: AC
  Administered 2014-05-23: 1.5 g via INTRAVENOUS
  Filled 2014-05-22: qty 1.5

## 2014-05-22 MED ORDER — SODIUM CHLORIDE 0.9 % IV SOLN
INTRAVENOUS | Status: DC
  Administered 2014-05-23: 08:00:00 via INTRAVENOUS

## 2014-05-23 ENCOUNTER — Encounter (HOSPITAL_COMMUNITY): Payer: Medicare Other | Admitting: Anesthesiology

## 2014-05-23 ENCOUNTER — Telehealth: Payer: Self-pay | Admitting: Vascular Surgery

## 2014-05-23 ENCOUNTER — Ambulatory Visit (HOSPITAL_COMMUNITY): Payer: Medicare Other

## 2014-05-23 ENCOUNTER — Encounter (HOSPITAL_COMMUNITY): Payer: Self-pay | Admitting: *Deleted

## 2014-05-23 ENCOUNTER — Encounter (HOSPITAL_COMMUNITY)
Admission: RE | Disposition: A | Discharge status: Home / Self Care | Payer: Self-pay | Source: Ambulatory Visit | Attending: Vascular Surgery

## 2014-05-23 ENCOUNTER — Ambulatory Visit (HOSPITAL_COMMUNITY)
Admission: RE | Admit: 2014-05-23 | Discharge: 2014-05-23 | Disposition: A | Discharge status: Home / Self Care | Payer: Medicare Other | Source: Ambulatory Visit | Attending: Vascular Surgery | Admitting: Vascular Surgery

## 2014-05-23 ENCOUNTER — Ambulatory Visit (HOSPITAL_COMMUNITY): Payer: Medicare Other | Admitting: Anesthesiology

## 2014-05-23 DIAGNOSIS — I739 Peripheral vascular disease, unspecified: Secondary | ICD-10-CM | POA: Diagnosis not present

## 2014-05-23 DIAGNOSIS — I252 Old myocardial infarction: Secondary | ICD-10-CM | POA: Diagnosis not present

## 2014-05-23 DIAGNOSIS — I509 Heart failure, unspecified: Secondary | ICD-10-CM | POA: Diagnosis not present

## 2014-05-23 DIAGNOSIS — E119 Type 2 diabetes mellitus without complications: Secondary | ICD-10-CM | POA: Diagnosis not present

## 2014-05-23 DIAGNOSIS — E669 Obesity, unspecified: Secondary | ICD-10-CM | POA: Diagnosis not present

## 2014-05-23 DIAGNOSIS — N186 End stage renal disease: Secondary | ICD-10-CM | POA: Insufficient documentation

## 2014-05-23 DIAGNOSIS — Z992 Dependence on renal dialysis: Secondary | ICD-10-CM | POA: Insufficient documentation

## 2014-05-23 DIAGNOSIS — I120 Hypertensive chronic kidney disease with stage 5 chronic kidney disease or end stage renal disease: Secondary | ICD-10-CM | POA: Insufficient documentation

## 2014-05-23 DIAGNOSIS — Z9861 Coronary angioplasty status: Secondary | ICD-10-CM | POA: Insufficient documentation

## 2014-05-23 DIAGNOSIS — N185 Chronic kidney disease, stage 5: Secondary | ICD-10-CM

## 2014-05-23 DIAGNOSIS — I2510 Atherosclerotic heart disease of native coronary artery without angina pectoris: Secondary | ICD-10-CM | POA: Insufficient documentation

## 2014-05-23 HISTORY — PX: AV FISTULA PLACEMENT: SHX1204

## 2014-05-23 LAB — POCT I-STAT 4, (NA,K, GLUC, HGB,HCT)
Glucose, Bld: 126 mg/dL — ABNORMAL HIGH (ref 70–99)
HCT: 39 % (ref 36.0–46.0)
Hemoglobin: 13.3 g/dL (ref 12.0–15.0)
Potassium: 3.3 mEq/L — ABNORMAL LOW (ref 3.7–5.3)
Sodium: 136 mEq/L — ABNORMAL LOW (ref 137–147)

## 2014-05-23 LAB — GLUCOSE, CAPILLARY
Glucose-Capillary: 143 mg/dL — ABNORMAL HIGH (ref 70–99)
Glucose-Capillary: 111 mg/dL — ABNORMAL HIGH (ref 70–99)

## 2014-05-23 SURGERY — INSERTION OF ARTERIOVENOUS (AV) GORE-TEX GRAFT ARM
Anesthesia: Monitor Anesthesia Care | Laterality: Left

## 2014-05-23 MED ORDER — THROMBIN 20000 UNITS EX SOLR
CUTANEOUS | Status: AC
  Filled 2014-05-23: qty 20000

## 2014-05-23 MED ORDER — EPHEDRINE SULFATE 50 MG/ML IJ SOLN
INTRAMUSCULAR | Status: AC
  Filled 2014-05-23: qty 1

## 2014-05-23 MED ORDER — LIDOCAINE-EPINEPHRINE (PF) 1 %-1:200000 IJ SOLN
INTRAMUSCULAR | Status: AC
  Filled 2014-05-23: qty 10

## 2014-05-23 MED ORDER — PROTAMINE SULFATE 10 MG/ML IV SOLN
INTRAVENOUS | Status: DC | PRN
  Administered 2014-05-23: 40 mg via INTRAVENOUS

## 2014-05-23 MED ORDER — PHENYLEPHRINE 40 MCG/ML (10ML) SYRINGE FOR IV PUSH (FOR BLOOD PRESSURE SUPPORT)
PREFILLED_SYRINGE | INTRAVENOUS | Status: AC
  Filled 2014-05-23: qty 10

## 2014-05-23 MED ORDER — PROPOFOL 10 MG/ML IV BOLUS
INTRAVENOUS | Status: DC | PRN
  Administered 2014-05-23: 30 mg via INTRAVENOUS

## 2014-05-23 MED ORDER — FENTANYL CITRATE 0.05 MG/ML IJ SOLN: 25.0000 ug | INTRAMUSCULAR | Status: DC | PRN

## 2014-05-23 MED ORDER — OXYCODONE HCL 5 MG/5ML PO SOLN: 5.0000 mg | Freq: Once | ORAL | Status: DC | PRN

## 2014-05-23 MED ORDER — PHENYLEPHRINE HCL 10 MG/ML IJ SOLN
10.0000 mg | INTRAVENOUS | Status: DC | PRN
  Administered 2014-05-23: 20 ug/min via INTRAVENOUS

## 2014-05-23 MED ORDER — PHENYLEPHRINE HCL 10 MG/ML IJ SOLN
INTRAMUSCULAR | Status: DC | PRN
  Administered 2014-05-23 (×4): 80 ug via INTRAVENOUS

## 2014-05-23 MED ORDER — LIDOCAINE HCL (PF) 1 % IJ SOLN
INTRAMUSCULAR | Status: AC
  Filled 2014-05-23: qty 30

## 2014-05-23 MED ORDER — 0.9 % SODIUM CHLORIDE (POUR BTL) OPTIME
TOPICAL | Status: DC | PRN
  Administered 2014-05-23: 1000 mL

## 2014-05-23 MED ORDER — PROTAMINE SULFATE 10 MG/ML IV SOLN
INTRAVENOUS | Status: AC
  Filled 2014-05-23: qty 5

## 2014-05-23 MED ORDER — PROPOFOL 10 MG/ML IV BOLUS
INTRAVENOUS | Status: AC
Start: 2014-05-23 — End: 2014-05-23
  Filled 2014-05-23: qty 20

## 2014-05-23 MED ORDER — PROPOFOL INFUSION 10 MG/ML OPTIME
INTRAVENOUS | Status: DC | PRN
  Administered 2014-05-23: 50 ug/kg/min via INTRAVENOUS

## 2014-05-23 MED ORDER — LIDOCAINE-EPINEPHRINE (PF) 1 %-1:200000 IJ SOLN
INTRAMUSCULAR | Status: DC | PRN
  Administered 2014-05-23: 30 mL

## 2014-05-23 MED ORDER — CHLORHEXIDINE GLUCONATE CLOTH 2 % EX PADS: 6.0000 | MEDICATED_PAD | Freq: Once | CUTANEOUS | Status: DC

## 2014-05-23 MED ORDER — HEPARIN SODIUM (PORCINE) 1000 UNIT/ML IJ SOLN
INTRAMUSCULAR | Status: DC | PRN
  Administered 2014-05-23: 7000 [IU] via INTRAVENOUS

## 2014-05-23 MED ORDER — FENTANYL CITRATE 0.05 MG/ML IJ SOLN
INTRAMUSCULAR | Status: DC | PRN
  Administered 2014-05-23: 50 ug via INTRAVENOUS

## 2014-05-23 MED ORDER — SODIUM CHLORIDE 0.9 % IJ SOLN
INTRAMUSCULAR | Status: AC
  Filled 2014-05-23: qty 10

## 2014-05-23 MED ORDER — OXYCODONE HCL 5 MG PO TABS: 5.0000 mg | ORAL_TABLET | Freq: Once | ORAL | Status: DC | PRN

## 2014-05-23 MED ORDER — SODIUM CHLORIDE 0.9 % IR SOLN
Status: DC | PRN
  Administered 2014-05-23: 08:00:00

## 2014-05-23 MED ORDER — SUCCINYLCHOLINE CHLORIDE 20 MG/ML IJ SOLN
INTRAMUSCULAR | Status: AC
  Filled 2014-05-23: qty 1

## 2014-05-23 MED ORDER — OXYCODONE-ACETAMINOPHEN 5-325 MG PO TABS: 1.0000 | ORAL_TABLET | ORAL | Status: DC | PRN

## 2014-05-23 MED ORDER — LIDOCAINE HCL (CARDIAC) 20 MG/ML IV SOLN
INTRAVENOUS | Status: AC
  Filled 2014-05-23: qty 5

## 2014-05-23 MED ORDER — FENTANYL CITRATE 0.05 MG/ML IJ SOLN
INTRAMUSCULAR | Status: AC
  Filled 2014-05-23: qty 5

## 2014-05-23 MED ORDER — THROMBIN 20000 UNITS EX SOLR
CUTANEOUS | Status: DC | PRN
  Administered 2014-05-23: 09:00:00 via TOPICAL

## 2014-05-23 MED ORDER — ONDANSETRON HCL 4 MG/2ML IJ SOLN: 4.0000 mg | Freq: Four times a day (QID) | INTRAMUSCULAR | Status: DC | PRN

## 2014-05-23 SURGICAL SUPPLY — 37 items
ADH SKN CLS APL DERMABOND .7 (GAUZE/BANDAGES/DRESSINGS) ×2
ARMBAND PINK RESTRICT EXTREMIT (MISCELLANEOUS) ×3 IMPLANT
CANISTER SUCTION 2500CC (MISCELLANEOUS) ×3 IMPLANT
CLIP TI MEDIUM 6 (CLIP) ×3 IMPLANT
CLIP TI WIDE RED SMALL 6 (CLIP) ×5 IMPLANT
CUFF TOURNIQUET SINGLE 18IN (TOURNIQUET CUFF) ×2 IMPLANT
DECANTER SPIKE VIAL GLASS SM (MISCELLANEOUS) ×1 IMPLANT
DERMABOND ADVANCED (GAUZE/BANDAGES/DRESSINGS) ×4
DERMABOND ADVANCED .7 DNX12 (GAUZE/BANDAGES/DRESSINGS) ×1 IMPLANT
ELECT REM PT RETURN 9FT ADLT (ELECTROSURGICAL) ×3
ELECTRODE REM PT RTRN 9FT ADLT (ELECTROSURGICAL) ×1 IMPLANT
GLOVE BIO SURGEON STRL SZ7.5 (GLOVE) ×3 IMPLANT
GLOVE BIOGEL PI IND STRL 6.5 (GLOVE) IMPLANT
GLOVE BIOGEL PI IND STRL 7.0 (GLOVE) IMPLANT
GLOVE BIOGEL PI IND STRL 8 (GLOVE) ×1 IMPLANT
GLOVE BIOGEL PI INDICATOR 6.5 (GLOVE) ×2
GLOVE BIOGEL PI INDICATOR 7.0 (GLOVE) ×2
GLOVE BIOGEL PI INDICATOR 8 (GLOVE) ×2
GLOVE ECLIPSE 6.5 STRL STRAW (GLOVE) ×2 IMPLANT
GLOVE SURG SS PI 6.0 STRL IVOR (GLOVE) ×2 IMPLANT
GOWN STRL REUS W/ TWL LRG LVL3 (GOWN DISPOSABLE) ×3 IMPLANT
GOWN STRL REUS W/ TWL XL LVL3 (GOWN DISPOSABLE) IMPLANT
GOWN STRL REUS W/TWL LRG LVL3 (GOWN DISPOSABLE) ×6
GOWN STRL REUS W/TWL XL LVL3 (GOWN DISPOSABLE) ×3
GRAFT GORETEX STRT 4-7X45 (Vascular Products) ×2 IMPLANT
KIT BASIN OR (CUSTOM PROCEDURE TRAY) ×3 IMPLANT
KIT ROOM TURNOVER OR (KITS) ×3 IMPLANT
NS IRRIG 1000ML POUR BTL (IV SOLUTION) ×3 IMPLANT
PACK CV ACCESS (CUSTOM PROCEDURE TRAY) ×3 IMPLANT
PAD ARMBOARD 7.5X6 YLW CONV (MISCELLANEOUS) ×6 IMPLANT
SPONGE SURGIFOAM ABS GEL 100 (HEMOSTASIS) ×2 IMPLANT
SUT PROLENE 6 0 BV (SUTURE) ×12 IMPLANT
SUT VIC AB 3-0 SH 27 (SUTURE) ×3
SUT VIC AB 3-0 SH 27X BRD (SUTURE) ×2 IMPLANT
SUT VICRYL 4-0 PS2 18IN ABS (SUTURE) ×4 IMPLANT
UNDERPAD 30X30 INCONTINENT (UNDERPADS AND DIAPERS) ×3 IMPLANT
WATER STERILE IRR 1000ML POUR (IV SOLUTION) ×3 IMPLANT

## 2014-05-23 NOTE — Anesthesia Preprocedure Evaluation (Addendum)
Anesthesia Evaluation  Patient identified by MRN, date of birth, ID band Patient awake    Reviewed: Allergy & Precautions, H&P , NPO status , Patient's Chart, lab work & pertinent test results  Airway Mallampati: II  Neck ROM: full    Dental  (+) Teeth Intact, Chipped, Dental Advisory Given, Edentulous Upper,    Pulmonary shortness of breath, sleep apnea , former smoker,          Cardiovascular hypertension, + angina + CAD, + Past MI, + Cardiac Stents, + Peripheral Vascular Disease and +CHF     Neuro/Psych  Headaches, CVA    GI/Hepatic   Endo/Other  diabetes, Type 2Hypothyroidism obese  Renal/GU ESRF and DialysisRenal disease     Musculoskeletal  (+) Arthritis -,   Abdominal   Peds  Hematology   Anesthesia Other Findings   Reproductive/Obstetrics                         Anesthesia Physical Anesthesia Plan  ASA: III  Anesthesia Plan: MAC   Post-op Pain Management:    Induction: Intravenous  Airway Management Planned: Simple Face Mask  Additional Equipment:   Intra-op Plan:   Post-operative Plan:   Informed Consent: I have reviewed the patients History and Physical, chart, labs and discussed the procedure including the risks, benefits and alternatives for the proposed anesthesia with the patient or authorized representative who has indicated his/her understanding and acceptance.     Plan Discussed with: CRNA, Anesthesiologist and Surgeon  Anesthesia Plan Comments:         Anesthesia Quick Evaluation

## 2014-05-23 NOTE — Anesthesia Postprocedure Evaluation (Signed)
Anesthesia Post Note  Patient: [name]  Procedure(s) Performed: Procedure(s) (LRB): INSERTION OF ARTERIOVENOUS (AV) GORE-TEX GRAFT ARM (Left)  Anesthesia type: MAC  Patient location: PACU  Post pain: Pain level controlled and Adequate analgesia  Post assessment: Post-op Vital signs reviewed, Patient's Cardiovascular Status Stable and Respiratory Function Stable  Last Vitals:  Filed Vitals:   05/23/14 1145  BP: 104/41  Pulse: 80  Temp:   Resp: 22    Post vital signs: Reviewed and stable  Level of consciousness: awake, alert  and oriented  Complications: No apparent anesthesia complications

## 2014-05-23 NOTE — Interval H&P Note (Signed)
History and Physical Interval Note:  05/23/2014 7:05 AM  [name]  has presented today for surgery, with the diagnosis of End stage renal disease   The various methods of treatment have been discussed with the patient and family. After consideration of risks, benefits and other options for treatment, the patient has consented to  Procedure(s): INSERTION OF ARTERIOVENOUS (AV) GORE-TEX GRAFT ARM (Left) as a surgical intervention .  The patient's history has been reviewed, patient examined, no change in status, stable for surgery.  I have reviewed the patient's chart and labs.  Questions were answered to the patient's satisfaction.     DICKSON,CHRISTOPHER S

## 2014-05-23 NOTE — Anesthesia Procedure Notes (Signed)
Procedure Name: MAC Date/Time: 05/23/2014 7:46 AM Performed by: Barrington Ellison Pre-anesthesia Checklist: Patient identified, Emergency Drugs available, Suction available and Patient being monitored Patient Re-evaluated:Patient Re-evaluated prior to inductionOxygen Delivery Method: Simple face mask

## 2014-05-23 NOTE — Transfer of Care (Signed)
Immediate Anesthesia Transfer of Care Note  Patient: [name]  Procedure(s) Performed: Procedure(s): INSERTION OF ARTERIOVENOUS (AV) GORE-TEX GRAFT ARM (Left)  Patient Location: PACU  Anesthesia Type:MAC  Level of Consciousness: awake, oriented and patient cooperative  Airway & Oxygen Therapy: Patient Spontanous Breathing  Post-op Assessment: Report given to PACU RN  Post vital signs: Reviewed and stable  Complications: No apparent anesthesia complications

## 2014-05-23 NOTE — H&P (View-Only) (Signed)
   Patient name: [name] MRN: [id number] DOB: 1935-01-15 Sex: female  REASON FOR VISIT: Evaluate for new hemodialysis access.  HPI: [name] is a 79 y.o. female who is a very complicated vascular access patient. She had a basilic vein transposition done in 2 stages by Dr. Bridgett Larsson. This failed to mature adequately. She underwent a vein mapping and the only adequate vein appeared to be the basilic vein on the right side. She underwent a basilic vein transposition on 04/18/2014 but unfortunately developed a steal syndrome and this was ligated on 04/19/14. She comes in to be evaluated for further access.  She still has some mild paresthesias in the ulnar aspect of her right upper extremity.  REVIEW OF SYSTEMS: Valu.Nieves ] denotes positive finding; [  ] denotes negative finding  CARDIOVASCULAR:  [ ]  chest pain   [ ]  dyspnea on exertion    CONSTITUTIONAL:  [ ]  fever   [ ]  chills  PHYSICAL EXAM: Filed Vitals:   05/07/14 1453  BP: 128/62  Pulse: 88  Resp: 18  Height: 5\' 1"  (1.549 m)  Weight: 192 lb 6.4 oz (87.272 kg)   Body mass index is 36.37 kg/(m^2). GENERAL: The patient is a well-nourished female, in no acute distress. The vital signs are documented above. CARDIOVASCULAR: There is a regular rate and rhythm. PULMONARY: There is good air exchange bilaterally without wheezing or rales. She has a palpable right radial pulse a palpable left radial pulse. She has biphasic radial and ulnar signals on the left side.  MEDICAL ISSUES:  End stage renal disease I think the next best option would be a left upper extremity AV graft. I don't think she has any options for a fistula remaining. I would be reluctant to place any further axis in the right arm given that she had significant steal in the right upper extremity. I will use a 4-7 mm tapered PTFE graft in the left arm to try to prevent steal however she does understand that this is a risk. We have discussed the procedure potential  complications and the office today including but not limited to wound healing problems, bleeding, graft thrombosis, graft infection, and steal syndrome. She dialyzes on Tuesdays Thursdays and Saturdays her surgery has been scheduled for a Friday, 05/23/2014.   [place] [facility] [place] Beeper: [phone]

## 2014-05-23 NOTE — Telephone Encounter (Addendum)
Message copied by Gena Fray on Fri May 23, 2014  1:55 PM ------      Message from: Mena Goes      Created: Fri May 23, 2014 10:45 AM      Regarding: Schedule                   ----- Message -----         From: Angelia Mould, MD         Sent: 05/23/2014   9:51 AM           To: Vvs Charge Pool      Subject: charge and f/u                                           PROCEDURE: left upper arm AV graft                  SURGEON: Judeth Cornfield. Scot Dock, MD, FACS                  ASSIST: Izetta Dakin RNFA            She'll need a follow up visit in 2-3 weeks to check on her incisions. Thank you.      CD ------  05/23/14: spoke with patient to schedule appt, dpm

## 2014-05-23 NOTE — Discharge Instructions (Signed)

## 2014-05-23 NOTE — Op Note (Signed)
    NAME: [name]   MRN: [id number] DOB: [birth year]    DATE OF OPERATION: 05/23/2014  PREOP DIAGNOSIS: Stage V chronic kidney disease  POSTOP DIAGNOSIS: Same  PROCEDURE: left upper arm AV graft  SURGEON: Judeth Cornfield. Scot Dock, MD, FACS  ASSIST: Izetta Dakin RNFA  ANESTHESIA: local with sedation   EBL: minimal  INDICATIONS: [name] is a 79 y.o. female who presents for new access. On the left side, she is previously had basilic vein transposition. She had steal syndrome in her right upper extremity.  FINDINGS: The brachial artery was very fragile. The axillary vein was 4.5 mm. Antecubital veins were fairly small.  TECHNIQUE: Patient was taken to the operating room and sedated by anesthesia. The left upper extremity was prepped and draped in the usual sterile fashion. After the skin was anesthetized with 1 lidocaine, a longitudinal incision was made just above the antecubital level. Here the artery was identified and was dissected anteriorly. There was dense scar tissue and the artery was quite fragile. Adjacent veins were very small. I therefore elected to place an upper arm graft. A separate longitudinal incision was made beneath the axilla after the skin was anesthetized. Here the high axillary vein was dissected free. A tunnel was created between the 2 incisions and the patient was heparinized. The graft had been brought through the tunnel. I selected a 4-7 mm PTFE graft in order to lower her risk of steal. Tourniquet was placed on the arm and I elected to do the arterial anastomosis under tourniquet control given that the artery was very fragile and looked like it had been previously repaired. The arm was exsanguinated with an Esmarch bandage. The tourniquet was inflated to 250 mm mercury. Under tourniquet control, a longitudinal arteriotomy was made at the site of the previous anastomosis and this was extended. The 4 mm end of the graft was spatulated. I left the  majority of the 4 mm end in order to lower the risk of steal. The graft was sewn end to side to the brachial artery using continuous 6-0 Prolene suture. The tourniquet was then released. The graft to the appropriate length. The axillary vein was ligated distally and spatulated proximally. The graft was cut to the appropriate length, spatulated, and sewn end to end to the vein using continuous 6-0 Prolene suture. At the completion was excellent thrill in the graft. There was a radial and ulnar signal with the Doppler. Hemostasis was obtained in the wound. The heparin was partially reversed with protamine. Each of the wounds closed the deep layer 3-0 Vicryl and the skin closed with 4-0 Vicryl. Dermabond was applied. The patient tolerated the procedure well and was transferred to the recovery room in stable condition. All needle and sponge counts were correct.  [name], MD, FACS [facility]  DATE OF DICTATION:   05/23/2014

## 2014-05-26 ENCOUNTER — Telehealth: Payer: Self-pay | Admitting: *Deleted

## 2014-05-26 NOTE — Telephone Encounter (Signed)
Son, [name], called to report that [name] is having some slight swelling in her left hand and some slight numbness in her ring and little fingers. Patient is s/p left upper arm AVG by Dr. Scot Dock on 05-23-14. The son says that she is doing well otherwise, has no skin discoloration,incision drainage or erythema, and is afebrile. She is trying to keep her arm elevated at all times but is having difficulty understanding the need for this intervention. I tried to discuss the reasons for elevation. She is able to grasp objects with little to no discomfort.  Son says this is nothing compared to the steal syndrome symptoms she had previously in the right arm. They will call us if any steal symptoms should appear in her left hand. Son voiced understanding of this symptoms and is in agreement with the plan. He knows Dr. Scot Dock will be in the office on Wednesday this week and will call back if any problems.

## 2014-05-27 ENCOUNTER — Encounter (HOSPITAL_COMMUNITY): Payer: Self-pay | Admitting: Vascular Surgery

## 2014-05-28 ENCOUNTER — Encounter: Payer: Self-pay | Admitting: Family

## 2014-05-28 ENCOUNTER — Ambulatory Visit (INDEPENDENT_AMBULATORY_CARE_PROVIDER_SITE_OTHER): Payer: Self-pay | Admitting: Family

## 2014-05-28 VITALS — BP 128/67 | HR 88 | Temp 97.5°F | Resp 18 | Ht 60.0 in | Wt 195.8 lb

## 2014-05-28 DIAGNOSIS — I770 Arteriovenous fistula, acquired: Secondary | ICD-10-CM

## 2014-05-28 DIAGNOSIS — N186 End stage renal disease: Secondary | ICD-10-CM

## 2014-05-28 DIAGNOSIS — T82898A Other specified complication of vascular prosthetic devices, implants and grafts, initial encounter: Secondary | ICD-10-CM

## 2014-05-28 NOTE — Progress Notes (Signed)
Established Dialysis Access  History of Present Illness  [name] is a 79 y.o. (October 20, 1934) female who is s/p left upper arm AV Graft 05-23-2014 by Dr. Scot Dock. Prior to this she underwent a basilic vein transposition done in 2 stages by Dr. Bridgett Larsson. This failed to mature adequately. She underwent a vein mapping and the only adequate vein appeared to be the basilic vein on the right side. She underwent a basilic vein transposition on 04/18/2014 but unfortunately developed a steal syndrome and this was ligated on 04/19/14.  Pt returns today with c/o intermittent numbness of left hand and left forearm since 05/25/2014. Patient denies fever of chills.  She had pain in the left upper arm one morning, but this seems to have resolved with use of her analgesic. She has been elevating left arm, working left hand with putty. She is dialyzed T-Th-Sat via a right side of chest temporary catheter.     Past Medical History  Diagnosis Date  . Hypertension   . Heart failure December 07, 2012    Right side  . Hypercalcemia     2nd to hyperparathyroidism  . Thyroid disease     Hyperparathyroidism  . Thyroid disease     Hypothyroidism  . Dyslipidemia   . CHF (congestive heart failure)   . Myocardial infarction 10/28/2010  . Peripheral vascular disease   . Short-term memory loss   . Stroke 2010ish    no residual  . Shingles 2010 ish  . Anginal pain   . Coronary artery disease   . Diabetes mellitus without complication     TYPE 2  . Shortness of breath   . Parathyroid cyst   . Headache(784.0)     CALCIFICATION LT SKULL  . Arthritis     Gout  . Cancer     kidney left removed  . Sleep apnea     Recently test in [place](NEG) NO CPAP   . ESRD (end stage renal disease)     dialysis T-TH-SAT  . Hypothyroidism     Social History History  Substance Use Topics  . Smoking status: Former Smoker -- 46 years    Types: Cigarettes    Quit date: 01/08/1998  . Smokeless tobacco: Never  Used  . Alcohol Use: No    Family History Family History  Problem Relation Age of Onset  . Diabetes Sister   . Heart disease Sister     before age 41  . Hyperlipidemia Sister   . Hypertension Sister   . Diabetes Sister   . Hyperlipidemia Sister   . Hypertension Sister     Surgical History Past Surgical History  Procedure Laterality Date  . Nephrectomy Left 2005  . Abdominal hysterectomy  2005  . Cholecystectomy  2005  . [place] stent    . Coronary stent placement Left 10/2010  . Av fistula placement Left 01/09/2013    Procedure: ARTERIOVENOUS (AV) FISTULA CREATION;  Surgeon: Mal Misty, MD;  Location: [facility];  Service: Vascular;  Laterality: Left;  Creation of Left Brachial Cephalic Arteriovenous Fistula  . Bascilic vein transposition Left 07/30/2013    Procedure: 1ST Stage BASCILIC VEIN TRANSPOSITION;  Surgeon: Angelia Mould, MD;  Location: [facility];  Service: Vascular;  Laterality: Left;  . Cardiac catheterization      2013 STENT PLACED  . Cataract extraction      BILAT  . Bascilic vein transposition Left 12/17/2013    Procedure: BASILIC VEIN TRANSPOSITION - LEFT ARM 2ND STAGE;  Surgeon: Angelia Mould, MD;  Location: [facility];  Service: Vascular;  Laterality: Left;  . Insertion of dialysis catheter Left 03/04/2014    Procedure: INSERTION OF DIALYSIS CATHETER;  Surgeon: Conrad Hilltop, MD;  Location: [facility];  Service: Vascular;  Laterality: Left;  Marland Kitchen Eye surgery Bilateral     cataract surgery  . Appendectomy    . Bascilic vein transposition Right 04/18/2014    Procedure: BASILIC VEIN TRANSPOSITION;  Surgeon: Angelia Mould, MD;  Location: [facility];  Service: Vascular;  Laterality: Right;  . Ligation of arteriovenous  fistula Right 04/19/2014    Procedure: LIGATION OF ARTERIOVENOUS  FISTULA;  Surgeon: Serafina Mitchell, MD;  Location: [facility];  Service: Vascular;  Laterality: Right;  . Av fistula placement Left 05/23/2014    Procedure: INSERTION OF ARTERIOVENOUS (AV)  GORE-TEX GRAFT ARM;  Surgeon: Angelia Mould, MD;  Location: [facility];  Service: Vascular;  Laterality: Left;    Allergies  Allergen Reactions  . Macrobid [Nitrofurantoin Macrocrystal] Anaphylaxis and Rash  . Ace Inhibitors Other (See Comments)    hyperkalemia  . Angiotensin Receptor Blockers Other (See Comments)    hyperkalemia  . Influenza Vaccines Itching, Rash and Other (See Comments)    blisters    Current Outpatient Prescriptions  Medication Sig Dispense Refill  . acetaminophen (TYLENOL) 500 MG tablet Take 1,000 mg by mouth 3 (three) times daily as needed for mild pain.       Marland Kitchen albuterol (PROVENTIL HFA;VENTOLIN HFA) 108 (90 BASE) MCG/ACT inhaler Inhale 2 puffs into the lungs every 6 (six) hours as needed for wheezing or shortness of breath.      Marland Kitchen albuterol (PROVENTIL) (2.5 MG/3ML) 0.083% nebulizer solution Take 2.5 mg by nebulization every 6 (six) hours as needed for wheezing or shortness of breath.       Marland Kitchen aspirin 81 MG chewable tablet Chew 81 mg by mouth every evening.      . calcitRIOL (ROCALTROL) 0.25 MCG capsule Take 0.25 mcg by mouth 3 (three) times a week. Tuesday, Thursday, and Saturday.      . fenofibrate 54 MG tablet Take 54 mg by mouth daily.       Marland Kitchen gabapentin (NEURONTIN) 400 MG capsule Take 400 mg by mouth 3 (three) times daily.      . insulin aspart (NOVOLOG) 100 UNIT/ML injection Inject 0-22 Units into the skin 3 (three) times daily with meals. CBG: 0-70= 0 units 71-100=9 units after meal 101-150=14 before meal 151-200=16 units before meal 201-250=17 units before meal 251-300=18 units before meal 301-350=19 units before meal 351-400=20 units before meal Over 400=22 units before meal      . insulin glargine (LANTUS) 100 UNIT/ML injection Inject 40 Units into the skin at bedtime.      Marland Kitchen levothyroxine (SYNTHROID, LEVOTHROID) 88 MCG tablet Take 88 mcg by mouth daily before breakfast.      . nitroGLYCERIN (NITROSTAT) 0.4 MG SL tablet Place 0.4 mg under the  tongue every 5 (five) minutes as needed for chest pain.      . Omega-3 Fatty Acids (FISH OIL) 1000 MG CAPS Take 1,000 mg by mouth 2 (two) times daily.      Marland Kitchen oxyCODONE-acetaminophen (ROXICET) 5-325 MG per tablet Take 1-2 tablets by mouth every 4 (four) hours as needed for severe pain.  20 tablet  0  . ranolazine (RANEXA) 500 MG 12 hr tablet Take 500 mg by mouth 2 (two) times daily.      . sevelamer carbonate (RENVELA)  800 MG tablet Take 1,600 mg by mouth 3 (three) times daily with meals.      . ticagrelor (BRILINTA) 90 MG TABS tablet Take 90 mg by mouth 2 (two) times daily.       No current facility-administered medications for this visit.     REVIEW OF SYSTEMS: see HPI for pertinent positives and negatives    PHYSICAL EXAMINATION:  Filed Vitals:   05/28/14 1514  BP: 128/67  Pulse: 88  Temp: 97.5 F (36.4 C)  Resp: 18  Height: 5' (1.524 m)  Weight: 195 lb 12.8 oz (88.814 kg)  SpO2: 98%   Body mass index is 38.24 kg/(m^2).  General: The patient appears their stated age, obese female, accompanied by her son. HEENT:  No gross abnormalities Pulmonary: Respirations are non-labored Abdomen: Soft and non-tender. Musculoskeletal: There are no major deformities.   Neurologic: No focal weakness Skin: There are no ulcers or rashes noted. Psychiatric: The patient has normal affect. Cardiovascular: There is a regular rate and rhythm. Left upper arm AV graft has an audible bruit, no drainage, mild erythema, moderate bruising. Right radial pulse is 2+ palpable. Left radial pulse is faintly palpable, is audible with Doppler and biphasic waveforms, left ulnar pulse is not palpable and is faintly monophasic by Doppler. Fingers of both hands are warm and pink.   Medical Decision Making  [name] is a 79 y.o. female who is s/p left upper arm AV Graft 05-23-2014. Dr. Scot Dock examined and spoke with patient and son.  Pt advised not to elevate left hand, she should keep left hand  dependent to encourage arterial flow to her left hand; also advised to work both hands with putty, et al, to encourage arterial flow. Return in 2-3 weeks to see Dr. Scot Dock, reevaluate left upper arm AVG and LUE. Patient advised to call our office sooner should she have worsening steal symptoms or signs/symptoms of infection.  If this AVG needs to eventually be ligated, her next option for HD access seems to be a thigh graft which carries more risk of failure and infection than an arm access.   NICKEL, Sharmon Leyden, RN, MSN, FNP-C [facility] Office: [phone]  05/28/2014, 3:25 PM  Clinic MD: Scot Dock

## 2014-06-04 ENCOUNTER — Encounter: Payer: Medicare Other | Admitting: Vascular Surgery

## 2014-06-04 ENCOUNTER — Other Ambulatory Visit (HOSPITAL_COMMUNITY): Payer: Medicare Other

## 2014-06-10 ENCOUNTER — Encounter: Payer: Self-pay | Admitting: Vascular Surgery

## 2014-06-11 ENCOUNTER — Ambulatory Visit (INDEPENDENT_AMBULATORY_CARE_PROVIDER_SITE_OTHER): Payer: Medicare Other | Admitting: Vascular Surgery

## 2014-06-11 ENCOUNTER — Encounter: Payer: Self-pay | Admitting: Vascular Surgery

## 2014-06-11 VITALS — BP 152/61 | HR 94 | Ht 60.0 in | Wt 191.9 lb

## 2014-06-11 DIAGNOSIS — N186 End stage renal disease: Secondary | ICD-10-CM

## 2014-06-11 MED ORDER — OXYCODONE-ACETAMINOPHEN 5-325 MG PO TABS: 1.0000 | ORAL_TABLET | ORAL | Status: DC | PRN

## 2014-06-11 NOTE — Assessment & Plan Note (Signed)
Her left upper arm graft is working well. She has mild steal symptoms which are stable. I used a 4 mm tapered graft so there really no good options to salvage the graft and improve her steal symptoms. She is not a good candidate for a thigh graft given her obesity. If her symptoms progress the only other option would be to consider a DRIL procedure. I will see her back if her symptoms do not improve.

## 2014-06-11 NOTE — Progress Notes (Signed)
   Patient name: [name] MRN: [id number] DOB: 1935-03-19 Sex: female  REASON FOR VISIT: Follow up after left upper arm AV graft.  HPI: [name] is a 79 y.o. female who had a left upper arm AV graft placed on 05/23/2014. She previously had steal syndrome in her right upper extremity. She previously had a basilic vein transposition on the left. I set her up her follow up visit as she was having some mild paresthesias in the left hand and mild steal symptoms. She continues to have some steal symptoms on the left. She hasn't tingling. She is exercising her hand and the pain has been stable and tolerable.  REVIEW OF SYSTEMS: Valu.Nieves ] denotes positive finding; [  ] denotes negative finding  CARDIOVASCULAR:  [ ]  chest pain   [ ]  dyspnea on exertion    CONSTITUTIONAL:  [ ]  fever   [ ]  chills  PHYSICAL EXAM: Filed Vitals:   06/11/14 0854  BP: 152/61  Pulse: 94  Height: 5' (1.524 m)  Weight: 191 lb 14.4 oz (87.045 kg)  SpO2: 94%   Body mass index is 37.48 kg/(m^2). GENERAL: The patient is a well-nourished female, in no acute distress. The vital signs are documented above. CARDIOVASCULAR: There is a regular rate and rhythm. PULMONARY: There is good air exchange bilaterally without wheezing or rales. She has an excellent thrill in her left upper arm graft. She has a radial and ulnar signal with the Doppler and a palm are arch signal. These augment with compression of her graft.  MEDICAL ISSUES:  End stage renal disease Her left upper arm graft is working well. She has mild steal symptoms which are stable. I used a 4 mm tapered graft so there really no good options to salvage the graft and improve her steal symptoms. She is not a good candidate for a thigh graft given her obesity. If her symptoms progress the only other option would be to consider a DRIL procedure. I will see her back if her symptoms do not improve.   [place] [facility]  [place] Beeper: [phone]

## 2014-08-07 ENCOUNTER — Encounter (HOSPITAL_COMMUNITY): Payer: Self-pay | Admitting: Vascular Surgery

## 2015-01-12 IMAGING — CR DG CHEST 1V PORT SAME DAY
1 series · 1 of 1 positions shown · non-contrast
Comparison: 02/28/2014

CLINICAL DATA: Shortness of breath.

EXAM:
PORTABLE CHEST - 1 VIEW SAME DAY

[AP]
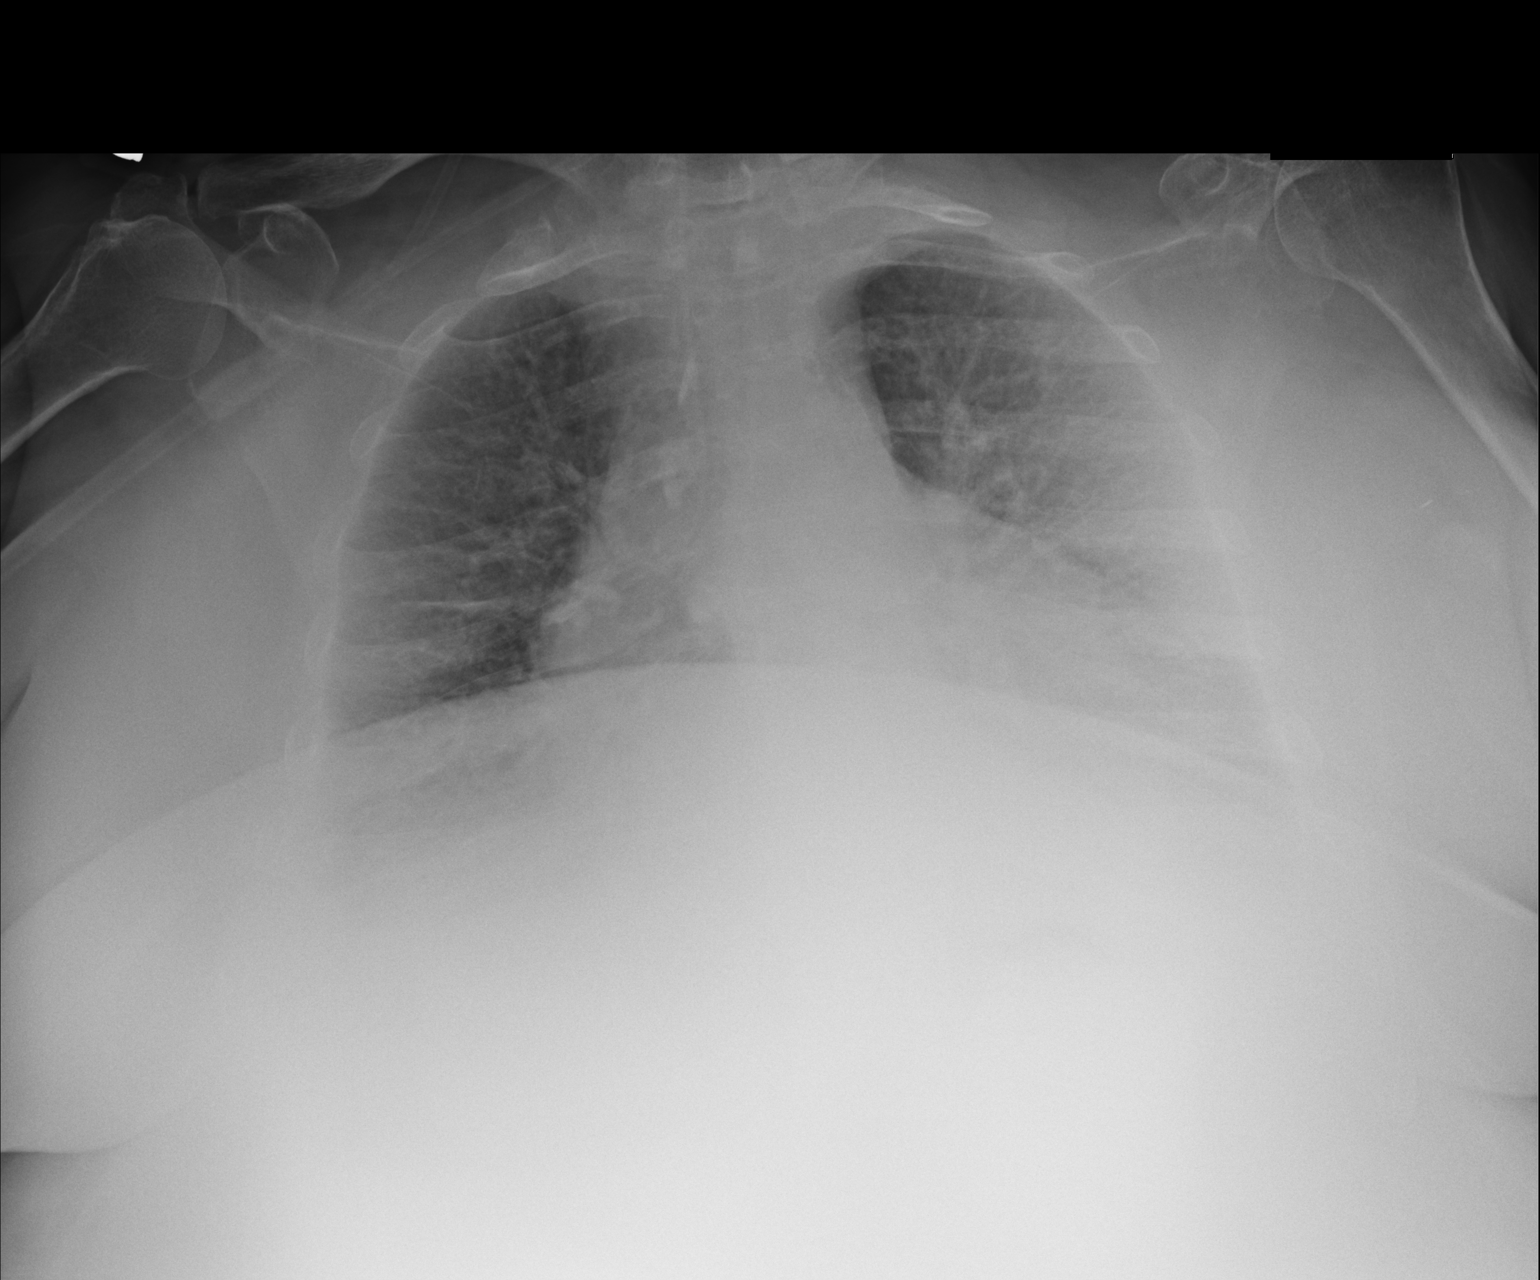

[1 of 1 positions shown; findings below may reference images not displayed]

FINDINGS: Study limited by body habitus and portable nature of the study.
There is cardiomegaly with vascular congestion. No definite focal
airspace opacity or effusion. Diffuse interstitial prominence may
reflect early interstitial edema.
IMPRESSION: Limited study by body habitus. Cardiomegaly with vascular congestion
and suggestion of early interstitial edema.

## 2015-01-15 IMAGING — CR DG CHEST 1V PORT
1 series · 1 of 1 positions shown · non-contrast
Comparison: Portable chest x-ray March 02, 2014

CLINICAL DATA: Status post diet tech catheter insertion

EXAM:
PORTABLE CHEST - 1 VIEW

[AP]
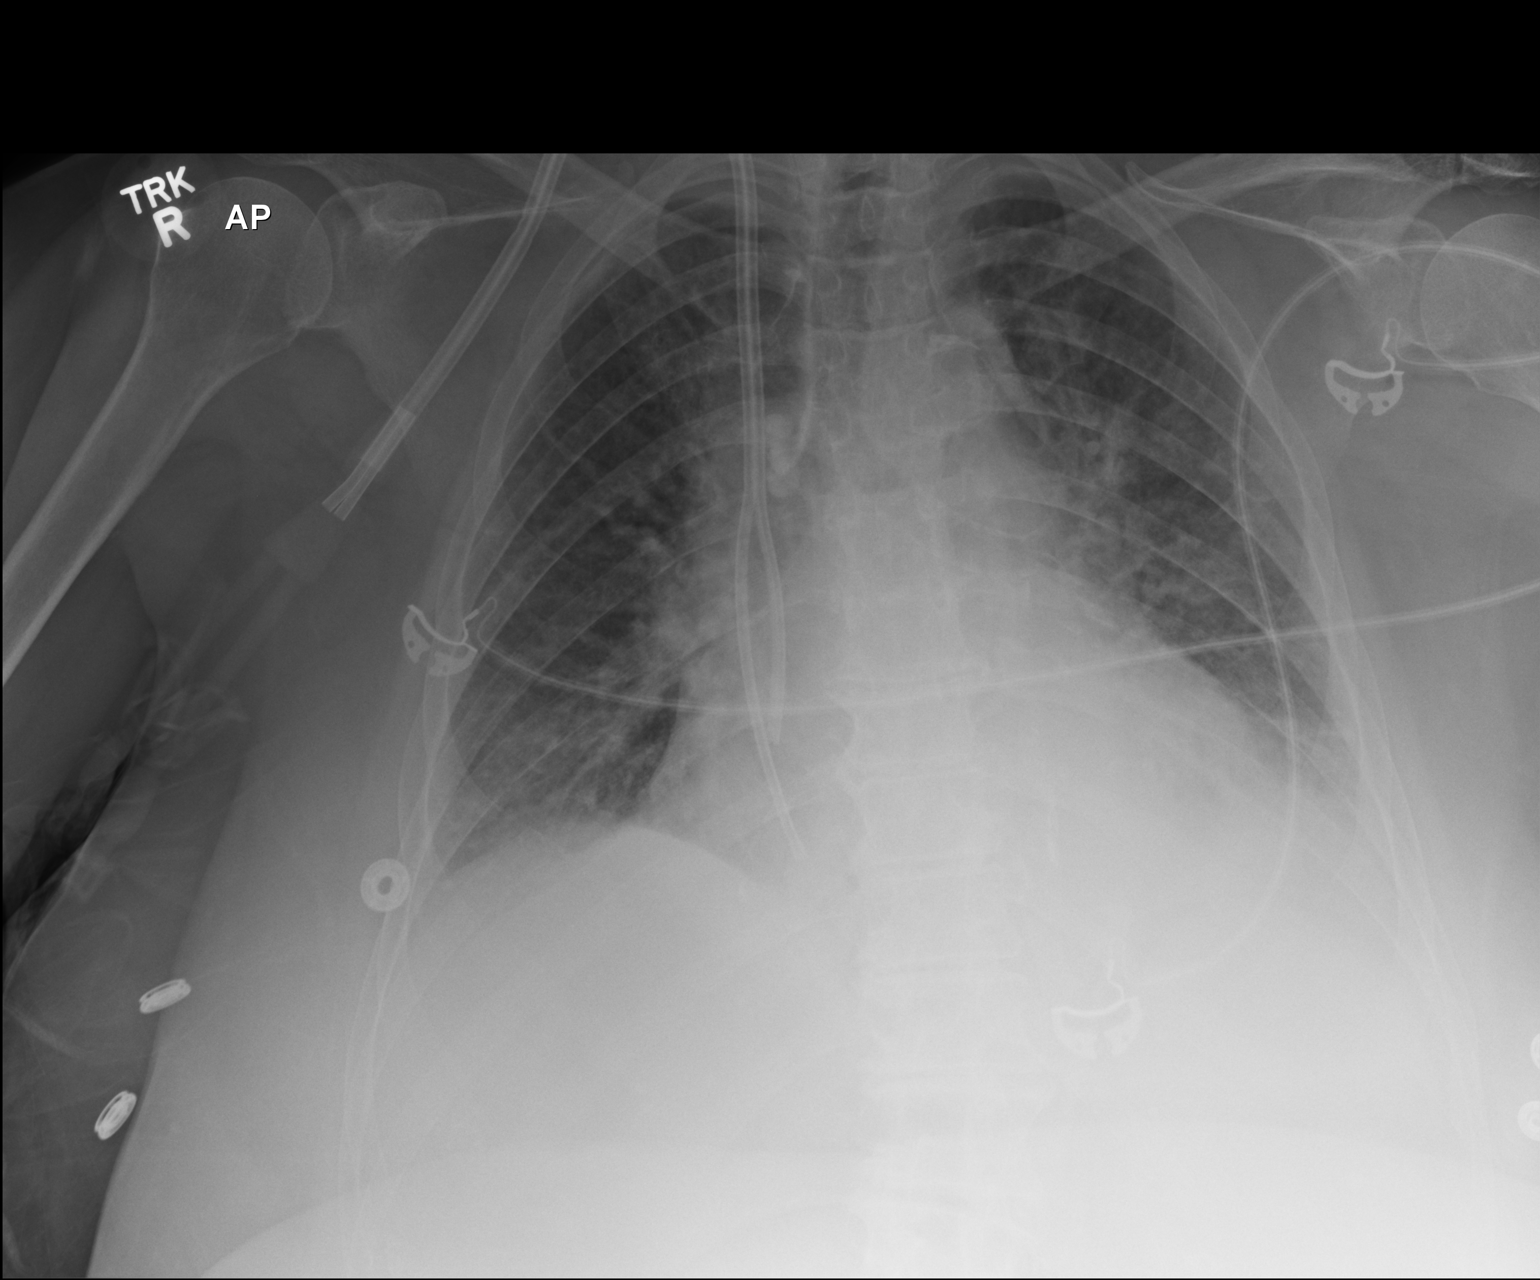

[1 of 1 positions shown; findings below may reference images not displayed]

FINDINGS: There has been interval removal of the previous catheter with
placement of the diatek catheter. There is no postprocedure
pneumothorax. The tip of the catheter lies in the cavoatrial
junction. The cardiac silhouette is mildly enlarged but stable. The
central pulmonary vascularity is prominent. The interstitial
markings remain mildly increased. The left hemidiaphragm remains
obscured.
IMPRESSION: There is no immediate postprocedure complication following placement
of the diet tech catheter. There is stable low-grade CHF.

## 2015-08-28 ENCOUNTER — Other Ambulatory Visit: Payer: Self-pay | Admitting: *Deleted

## 2015-08-28 DIAGNOSIS — N189 Chronic kidney disease, unspecified: Secondary | ICD-10-CM

## 2015-08-28 DIAGNOSIS — Z01810 Encounter for preprocedural cardiovascular examination: Secondary | ICD-10-CM

## 2015-09-02 ENCOUNTER — Encounter (HOSPITAL_COMMUNITY): Payer: Self-pay

## 2015-09-02 ENCOUNTER — Other Ambulatory Visit (HOSPITAL_COMMUNITY): Payer: Self-pay

## 2015-09-11 ENCOUNTER — Ambulatory Visit: Payer: Self-pay | Admitting: Vascular Surgery

## 2015-10-02 ENCOUNTER — Encounter: Payer: Self-pay | Admitting: Vascular Surgery

## 2015-10-07 ENCOUNTER — Ambulatory Visit (HOSPITAL_COMMUNITY)
Admission: RE | Admit: 2015-10-07 | Discharge: 2015-10-07 | Disposition: A | Discharge status: Home / Self Care | Payer: Medicare Other | Source: Ambulatory Visit | Attending: Vascular Surgery | Admitting: Vascular Surgery

## 2015-10-07 ENCOUNTER — Ambulatory Visit (INDEPENDENT_AMBULATORY_CARE_PROVIDER_SITE_OTHER): Payer: Medicare Other | Admitting: Vascular Surgery

## 2015-10-07 ENCOUNTER — Encounter: Payer: Self-pay | Admitting: Vascular Surgery

## 2015-10-07 ENCOUNTER — Ambulatory Visit (INDEPENDENT_AMBULATORY_CARE_PROVIDER_SITE_OTHER)
Admission: RE | Admit: 2015-10-07 | Discharge: 2015-10-07 | Disposition: A | Discharge status: Home / Self Care | Payer: Medicare Other | Source: Ambulatory Visit | Attending: Vascular Surgery | Admitting: Vascular Surgery

## 2015-10-07 VITALS — BP 151/74 | HR 65 | Ht 60.0 in | Wt 196.4 lb

## 2015-10-07 DIAGNOSIS — I120 Hypertensive chronic kidney disease with stage 5 chronic kidney disease or end stage renal disease: Secondary | ICD-10-CM | POA: Diagnosis not present

## 2015-10-07 DIAGNOSIS — N185 Chronic kidney disease, stage 5: Secondary | ICD-10-CM

## 2015-10-07 DIAGNOSIS — Z01810 Encounter for preprocedural cardiovascular examination: Secondary | ICD-10-CM

## 2015-10-07 DIAGNOSIS — I7025 Atherosclerosis of native arteries of other extremities with ulceration: Secondary | ICD-10-CM

## 2015-10-07 DIAGNOSIS — E785 Hyperlipidemia, unspecified: Secondary | ICD-10-CM | POA: Diagnosis not present

## 2015-10-07 DIAGNOSIS — E1122 Type 2 diabetes mellitus with diabetic chronic kidney disease: Secondary | ICD-10-CM | POA: Insufficient documentation

## 2015-10-07 DIAGNOSIS — N189 Chronic kidney disease, unspecified: Secondary | ICD-10-CM

## 2015-10-07 DIAGNOSIS — N186 End stage renal disease: Secondary | ICD-10-CM | POA: Diagnosis not present

## 2015-10-07 NOTE — Progress Notes (Signed)
[facility]  Patient name: [name] MRN: [id number] DOB: 08/22/1935 Sex: female  REASON FOR CONSULT: Evaluate for new hemodialysis access. Referred by Dr. Lorrene Reid.  HPI: [name] is a 81 y.o. female, who has been a difficult access challenge. Her only option for a fistula in the right arm was a basilic vein transposition. She had a basilic vein transposition on the right but had significant steal symptoms and this had to be ligated. Subsequently, she had access placed in the left arm and required placement of a left upper arm graft because of the very small size of her veins. She has had several interventions by [facility] and according to the patient ultimately required a stent in the outflow vein. When no further options were possible to salvage the left upper arm graft a right IJ tunneled dialysis catheter was placed and she was sent for evaluation for new access.  The situation is further complicated by a wound on her right great toe. She had a toenail trimmed and developed a wound on the right great toe. I do not get any clear-cut history of claudication although her activity I believe is fairly limited. She denies rest pain in her feet.  Past Medical History  Diagnosis Date  . Hypertension   . Heart failure [facility]) December 07, 2012    Right side  . Hypercalcemia     2nd to hyperparathyroidism  . Thyroid disease     Hyperparathyroidism  . Thyroid disease     Hypothyroidism  . Dyslipidemia   . CHF (congestive heart failure) ([place])   . Myocardial infarction ([place]) 10/28/2010  . Peripheral vascular disease ([place])   . Short-term memory loss   . Stroke ([place]) 2010ish    no residual  . Shingles 2010 ish  . Anginal pain ([place])   . Coronary artery disease   . Diabetes mellitus without complication ([place])     TYPE 2  . Shortness of breath   . Parathyroid cyst ([place])   . Headache(784.0)     CALCIFICATION LT SKULL  . Arthritis     Gout  .  Cancer ([place])     kidney left removed  . Sleep apnea     Recently test in [place](NEG) NO CPAP   . ESRD (end stage renal disease) ([place])     dialysis T-TH-SAT  . Hypothyroidism     Family History  Problem Relation Age of Onset  . Diabetes Sister   . Heart disease Sister     before age 61  . Hyperlipidemia Sister   . Hypertension Sister   . Diabetes Sister   . Hyperlipidemia Sister   . Hypertension Sister     SOCIAL HISTORY: Social History   Social History  . Marital Status: Widowed    Spouse Name: N/A  . Number of Children: N/A  . Years of Education: N/A   Occupational History  . Not on file.   Social History Main Topics  . Smoking status: Former Smoker -- 46 years    Types: Cigarettes    Quit date: 01/08/1998  . Smokeless tobacco: Never Used  . Alcohol Use: No  . Drug Use: No  . Sexual Activity: Not on file   Other Topics Concern  . Not on file   Social History Narrative    Allergies  Allergen Reactions  . Macrobid [Nitrofurantoin Macrocrystal] Anaphylaxis and Rash  . Ace Inhibitors Other (See Comments)    hyperkalemia  .  Angiotensin Receptor Blockers Other (See Comments)    hyperkalemia  . Influenza Vaccines Itching, Rash and Other (See Comments)    blisters    Current Outpatient Prescriptions  Medication Sig Dispense Refill  . acetaminophen (TYLENOL) 500 MG tablet Take 1,000 mg by mouth 3 (three) times daily as needed for mild pain.     Marland Kitchen albuterol (PROVENTIL HFA;VENTOLIN HFA) 108 (90 BASE) MCG/ACT inhaler Inhale 2 puffs into the lungs every 6 (six) hours as needed for wheezing or shortness of breath.    Marland Kitchen albuterol (PROVENTIL) (2.5 MG/3ML) 0.083% nebulizer solution Take 2.5 mg by nebulization every 6 (six) hours as needed for wheezing or shortness of breath.     Marland Kitchen aspirin 81 MG chewable tablet Chew 325 mg by mouth every evening.     . calcitRIOL (ROCALTROL) 0.25 MCG capsule Take 0.25 mcg by mouth 3 (three) times a week. Tuesday, Thursday, and  Saturday.    . cinacalcet (SENSIPAR) 30 MG tablet Take 30 mg by mouth daily with supper.    . fenofibrate 54 MG tablet Take 54 mg by mouth daily.     . folic acid-vitamin b complex-vitamin c-selenium-zinc (DIALYVITE) 3 MG TABS tablet Take 1 tablet by mouth daily.    Marland Kitchen gabapentin (NEURONTIN) 400 MG capsule Take 400 mg by mouth 3 (three) times daily.    . insulin aspart (NOVOLOG) 100 UNIT/ML injection Inject 0-22 Units into the skin 3 (three) times daily with meals. CBG: 0-70= 0 units 71-100=9 units after meal 101-150=14 before meal 151-200=16 units before meal 201-250=17 units before meal 251-300=18 units before meal 301-350=19 units before meal 351-400=20 units before meal Over 400=22 units before meal    . levothyroxine (SYNTHROID, LEVOTHROID) 88 MCG tablet Take 88 mcg by mouth daily before breakfast.    . nitroGLYCERIN (NITROSTAT) 0.4 MG SL tablet Place 0.4 mg under the tongue every 5 (five) minutes as needed for chest pain.    . Omega-3 Fatty Acids (FISH OIL) 1000 MG CAPS Take 1,000 mg by mouth 2 (two) times daily.    . insulin glargine (LANTUS) 100 UNIT/ML injection Inject 40 Units into the skin at bedtime. Reported on 10/07/2015    . oxyCODONE-acetaminophen (ROXICET) 5-325 MG per tablet Take 1-2 tablets by mouth every 4 (four) hours as needed for severe pain. (Patient not taking: Reported on 10/07/2015) 20 tablet 0  . oxyCODONE-acetaminophen (ROXICET) 5-325 MG per tablet Take 1-2 tablets by mouth every 4 (four) hours as needed for severe pain. (Patient not taking: Reported on 10/07/2015) 30 tablet 0  . ranolazine (RANEXA) 500 MG 12 hr tablet Take 500 mg by mouth 2 (two) times daily. Reported on 10/07/2015    . sevelamer carbonate (RENVELA) 800 MG tablet Take 1,600 mg by mouth 3 (three) times daily with meals. Reported on 10/07/2015    . ticagrelor (BRILINTA) 90 MG TABS tablet Take 90 mg by mouth 2 (two) times daily. Reported on 10/07/2015     No current facility-administered medications for this  visit.    REVIEW OF SYSTEMS:  [X]  denotes positive finding, [ ]  denotes negative finding Cardiac  Comments:  Chest pain or chest pressure:    Shortness of breath upon exertion:    Short of breath when lying flat:    Irregular heart rhythm:        Vascular    Pain in calf, thigh, or hip brought on by ambulation: X Mild bilateral calf  Pain in feet at night that wakes you up from your sleep:  Blood clot in your veins:    Leg swelling:         Pulmonary    Oxygen at home:    Productive cough:     Wheezing:         Neurologic    Sudden weakness in arms or legs:     Sudden numbness in arms or legs:     Sudden onset of difficulty speaking or slurred speech:    Temporary loss of vision in one eye:     Problems with dizziness:         Gastrointestinal    Blood in stool:     Vomited blood:         Genitourinary    Burning when urinating:     Blood in urine:        Psychiatric    Major depression:         Hematologic    Bleeding problems:    Problems with blood clotting too easily:        Skin    Rashes or ulcers:        Constitutional    Fever or chills:      PHYSICAL EXAM: Filed Vitals:   10/07/15 1522 10/07/15 1526  BP: 165/70 151/74  Pulse: 65   Height: 5' (1.524 m)   Weight: 196 lb 6.4 oz (89.086 kg)   SpO2: 98%    Body mass index is 38.36 kg/(m^2).  GENERAL: The patient is a morbidly obese female, in no acute distress. The vital signs are documented above. CARDIAC: There is a regular rate and rhythm.  VASCULAR: She has palpable femoral pulses although they are difficult to palpate because of her obesity. PULMONARY: There is good air exchange bilaterally without wheezing or rales. ABDOMEN: Soft and non-tender with normal pitched bowel sounds.  MUSCULOSKELETAL: There are no major deformities. NEUROLOGIC: No focal weakness or paresthesias are detected. SKIN: She has a wound on her right great toe. PSYCHIATRIC: The patient has a normal affect.  DATA:    UPPER EXTREMITY ARTERIAL DUPLEX: I have independently interpreted her upper chimney arterial duplex scan. On the right side there is a triphasic radial and ulnar signal.   On the left side there is a biphasic radial and ulnar signal. Allen's test is normal on the left and slightly abnormal on the right.  UPPER EXTREMITY VEIN MAP:  On the right side, the forearm cephalic vein looks very small. The upper arm cephalic vein looks small the antecubital level but is larger further proximally. The basilic vein was very short on the right. On the left side it was a graft in the upper arm the cephalic vein did not look adequate in the upper arm or forearm.  MEDICAL ISSUES:  STAGE V CHRONIC KIDNEY DISEASE:  She has no further options for access in the left arm. On the right side she could potentially have an AV graft placed however she previously had steal in the right arm. I think it might still be possible to place a graft if I used a 4-7 mm PTFE graft.Given the wound on her right foot she is clearly not a candidate for a right thigh graft. She also has evidence of significant peripheral vascular disease on the left and is not a good candidate for a left thigh graft. In addition because of her obesity she is not a good candidate for thigh graft. Given that she needs an arteriogram because of her wound on the right foot which  is a limb threatening problem, I have recommended that we also study the right upper extremity arteries in order to help determine if she is a candidate for a right upper arm graft. I will make further recommendations pending the results of her arteriogram.  PERIPHERAL VASCULAR DISEASE WITH WOUND ON RIGHT GREAT TOE: Given her diabetes, peripheral vascular disease, and the wound on her right great toe this is clearly a limb threatening situation. I have recommended that we proceed with arteriography in order to determine if there are any options for revascularization. I have reviewed with  the patient the indications for arteriography. In addition, I have reviewed the potential complications of arteriography including but not limited to: Bleeding, arterial injury, arterial thrombosis, dye action, renal insufficiency, or other unpredictable medical problems. I have explained to the patient that if we find disease amenable to angioplasty we could potentially address this at the same time. I have discussed the potential complications of angioplasty and stenting, including but not limited to: Bleeding, arterial thrombosis, arterial injury, dissection, or the need for surgical intervention. Her arteriogram is scheduled for 10/12/2015. I'll make further recommendations pending these results.  HYPERTENSION: The patient's initial blood pressure today was elevated. We repeated this and this was still elevated. We have encouraged the patient to follow up with their primary care physician for management of their blood pressure.  [name] [facility] [place]: [phone]

## 2015-10-08 ENCOUNTER — Other Ambulatory Visit: Payer: Self-pay

## 2015-10-12 ENCOUNTER — Other Ambulatory Visit: Payer: Self-pay | Admitting: *Deleted

## 2015-10-12 ENCOUNTER — Encounter (HOSPITAL_COMMUNITY)
Admission: RE | Disposition: A | Discharge status: Home / Self Care | Payer: Self-pay | Source: Ambulatory Visit | Attending: Vascular Surgery

## 2015-10-12 ENCOUNTER — Encounter: Payer: Self-pay | Admitting: Nephrology

## 2015-10-12 ENCOUNTER — Ambulatory Visit (HOSPITAL_COMMUNITY)
Admission: RE | Admit: 2015-10-12 | Discharge: 2015-10-12 | Disposition: A | Discharge status: Home / Self Care | Payer: Medicare Other | Source: Ambulatory Visit | Attending: Vascular Surgery | Admitting: Vascular Surgery

## 2015-10-12 DIAGNOSIS — M1990 Unspecified osteoarthritis, unspecified site: Secondary | ICD-10-CM | POA: Insufficient documentation

## 2015-10-12 DIAGNOSIS — Z6838 Body mass index (BMI) 38.0-38.9, adult: Secondary | ICD-10-CM | POA: Insufficient documentation

## 2015-10-12 DIAGNOSIS — Z7982 Long term (current) use of aspirin: Secondary | ICD-10-CM | POA: Insufficient documentation

## 2015-10-12 DIAGNOSIS — E1122 Type 2 diabetes mellitus with diabetic chronic kidney disease: Secondary | ICD-10-CM | POA: Diagnosis not present

## 2015-10-12 DIAGNOSIS — E785 Hyperlipidemia, unspecified: Secondary | ICD-10-CM | POA: Diagnosis not present

## 2015-10-12 DIAGNOSIS — E214 Other specified disorders of parathyroid gland: Secondary | ICD-10-CM | POA: Diagnosis not present

## 2015-10-12 DIAGNOSIS — Z905 Acquired absence of kidney: Secondary | ICD-10-CM | POA: Insufficient documentation

## 2015-10-12 DIAGNOSIS — E213 Hyperparathyroidism, unspecified: Secondary | ICD-10-CM | POA: Insufficient documentation

## 2015-10-12 DIAGNOSIS — I70202 Unspecified atherosclerosis of native arteries of extremities, left leg: Secondary | ICD-10-CM | POA: Diagnosis not present

## 2015-10-12 DIAGNOSIS — L97519 Non-pressure chronic ulcer of other part of right foot with unspecified severity: Secondary | ICD-10-CM | POA: Insufficient documentation

## 2015-10-12 DIAGNOSIS — Z8249 Family history of ischemic heart disease and other diseases of the circulatory system: Secondary | ICD-10-CM | POA: Diagnosis not present

## 2015-10-12 DIAGNOSIS — I70235 Atherosclerosis of native arteries of right leg with ulceration of other part of foot: Secondary | ICD-10-CM | POA: Diagnosis not present

## 2015-10-12 DIAGNOSIS — I252 Old myocardial infarction: Secondary | ICD-10-CM | POA: Insufficient documentation

## 2015-10-12 DIAGNOSIS — I132 Hypertensive heart and chronic kidney disease with heart failure and with stage 5 chronic kidney disease, or end stage renal disease: Secondary | ICD-10-CM | POA: Insufficient documentation

## 2015-10-12 DIAGNOSIS — I509 Heart failure, unspecified: Secondary | ICD-10-CM | POA: Diagnosis not present

## 2015-10-12 DIAGNOSIS — Z01810 Encounter for preprocedural cardiovascular examination: Secondary | ICD-10-CM

## 2015-10-12 DIAGNOSIS — N186 End stage renal disease: Secondary | ICD-10-CM | POA: Diagnosis present

## 2015-10-12 DIAGNOSIS — Z85528 Personal history of other malignant neoplasm of kidney: Secondary | ICD-10-CM | POA: Insufficient documentation

## 2015-10-12 DIAGNOSIS — I2510 Atherosclerotic heart disease of native coronary artery without angina pectoris: Secondary | ICD-10-CM | POA: Diagnosis not present

## 2015-10-12 DIAGNOSIS — Z992 Dependence on renal dialysis: Secondary | ICD-10-CM | POA: Insufficient documentation

## 2015-10-12 DIAGNOSIS — Z87891 Personal history of nicotine dependence: Secondary | ICD-10-CM | POA: Diagnosis not present

## 2015-10-12 DIAGNOSIS — I739 Peripheral vascular disease, unspecified: Secondary | ICD-10-CM

## 2015-10-12 DIAGNOSIS — Z794 Long term (current) use of insulin: Secondary | ICD-10-CM | POA: Diagnosis not present

## 2015-10-12 DIAGNOSIS — G4730 Sleep apnea, unspecified: Secondary | ICD-10-CM | POA: Insufficient documentation

## 2015-10-12 DIAGNOSIS — M109 Gout, unspecified: Secondary | ICD-10-CM | POA: Insufficient documentation

## 2015-10-12 DIAGNOSIS — Z8673 Personal history of transient ischemic attack (TIA), and cerebral infarction without residual deficits: Secondary | ICD-10-CM | POA: Insufficient documentation

## 2015-10-12 HISTORY — PX: PERIPHERAL VASCULAR CATHETERIZATION: SHX172C

## 2015-10-12 LAB — POCT I-STAT, CHEM 8
BUN: 51 mg/dL — AB (ref 6–20)
CALCIUM ION: 1.08 mmol/L — AB (ref 1.13–1.30)
CHLORIDE: 104 mmol/L (ref 101–111)
Creatinine, Ser: 4.8 mg/dL — ABNORMAL HIGH (ref 0.44–1.00)
GLUCOSE: 197 mg/dL — AB (ref 65–99)
HCT: 43 % (ref 36.0–46.0)
Hemoglobin: 14.6 g/dL (ref 12.0–15.0)
Potassium: 4 mmol/L (ref 3.5–5.1)
SODIUM: 138 mmol/L (ref 135–145)
TCO2: 23 mmol/L (ref 0–100)

## 2015-10-12 LAB — GLUCOSE, CAPILLARY: Glucose-Capillary: 165 mg/dL — ABNORMAL HIGH (ref 65–99)

## 2015-10-12 SURGERY — ABDOMINAL AORTOGRAM W/LOWER EXTREMITY

## 2015-10-12 MED ORDER — SODIUM CHLORIDE 0.9 % IV SOLN: INTRAVENOUS | Status: DC

## 2015-10-12 MED ORDER — ACETAMINOPHEN 325 MG PO TABS
ORAL_TABLET | ORAL | Status: AC
  Filled 2015-10-12: qty 2

## 2015-10-12 MED ORDER — CEPHALEXIN 250 MG PO CAPS: 250.0000 mg | ORAL_CAPSULE | Freq: Three times a day (TID) | ORAL | Status: DC

## 2015-10-12 MED ORDER — FENTANYL CITRATE (PF) 100 MCG/2ML IJ SOLN
INTRAMUSCULAR | Status: AC
  Filled 2015-10-12: qty 2

## 2015-10-12 MED ORDER — HEPARIN (PORCINE) IN NACL 2-0.9 UNIT/ML-% IJ SOLN
INTRAMUSCULAR | Status: AC
  Filled 2015-10-12: qty 1000

## 2015-10-12 MED ORDER — IODIXANOL 320 MG/ML IV SOLN
INTRAVENOUS | Status: DC | PRN
  Administered 2015-10-12: 200 mL via INTRAVENOUS

## 2015-10-12 MED ORDER — MIDAZOLAM HCL 2 MG/2ML IJ SOLN
INTRAMUSCULAR | Status: DC | PRN
  Administered 2015-10-12: 1 mg via INTRAVENOUS

## 2015-10-12 MED ORDER — HEPARIN (PORCINE) IN NACL 2-0.9 UNIT/ML-% IJ SOLN
INTRAMUSCULAR | Status: DC | PRN
  Administered 2015-10-12: 1000 mL via INTRA_ARTERIAL

## 2015-10-12 MED ORDER — MIDAZOLAM HCL 2 MG/2ML IJ SOLN
INTRAMUSCULAR | Status: AC
  Filled 2015-10-12: qty 2

## 2015-10-12 MED ORDER — LIDOCAINE HCL (PF) 1 % IJ SOLN
INTRAMUSCULAR | Status: DC | PRN
  Administered 2015-10-12: 8 mL via SUBCUTANEOUS

## 2015-10-12 MED ORDER — LIDOCAINE HCL (PF) 1 % IJ SOLN
INTRAMUSCULAR | Status: AC
  Filled 2015-10-12: qty 30

## 2015-10-12 MED ORDER — ACETAMINOPHEN 325 MG PO TABS
650.0000 mg | ORAL_TABLET | Freq: Four times a day (QID) | ORAL | Status: DC | PRN
  Administered 2015-10-12: 650 mg via ORAL
  Filled 2015-10-12: qty 2

## 2015-10-12 MED ORDER — FENTANYL CITRATE (PF) 100 MCG/2ML IJ SOLN
INTRAMUSCULAR | Status: DC | PRN
  Administered 2015-10-12: 50 ug via INTRAVENOUS

## 2015-10-12 SURGICAL SUPPLY — 14 items
CATH ANGIO 5F BER2 100CM (CATHETERS) ×2 IMPLANT
CATH ANGIO 5F PIGTAIL 100CM (CATHETERS) ×2 IMPLANT
CATH HEADHUNTER H1 5F 100CM (CATHETERS) ×2 IMPLANT
COVER PRB 48X5XTLSCP FOLD TPE (BAG) IMPLANT
COVER PROBE 5X48 (BAG) ×3
GUIDEWIRE ANGLED .035X260CM (WIRE) ×2 IMPLANT
KIT MICROINTRODUCER STIFF 5F (SHEATH) ×2 IMPLANT
KIT PV (KITS) ×3 IMPLANT
SHEATH PINNACLE 5F 10CM (SHEATH) ×2 IMPLANT
SYR MEDRAD MARK V 150ML (SYRINGE) ×3 IMPLANT
TRANSDUCER W/STOPCOCK (MISCELLANEOUS) ×3 IMPLANT
TRAY PV CATH (CUSTOM PROCEDURE TRAY) ×3 IMPLANT
WIRE BENTSON .035X145CM (WIRE) ×2 IMPLANT
WIRE TORQFLEX AUST .018X40CM (WIRE) ×4 IMPLANT

## 2015-10-12 NOTE — Interval H&P Note (Signed)
History and Physical Interval Note:  10/12/2015 9:34 AM  [name]  has presented today for surgery, with the diagnosis of pvd w/right great toe ulcer/stage 5 cronic kidney disease  The various methods of treatment have been discussed with the patient and family. After consideration of risks, benefits and other options for treatment, the patient has consented to  Procedure(s): Abdominal Aortogram w/Lower Extremity (N/A) Upper Extremity Angiography/Right (N/A) as a surgical intervention .  The patient's history has been reviewed, patient examined, no change in status, stable for surgery.  I have reviewed the patient's chart and labs.  Questions were answered to the patient's satisfaction.     [name]

## 2015-10-12 NOTE — Op Note (Signed)
PATIENT: [name]  MRN: [id number] DOB: 1935/03/19    DATE OF PROCEDURE: 10/12/2015  INDICATIONS: [name] is a 81 y.o. female who dialyzes with a right IJ tunneled dialysis catheter. She is not a candidate for access in the left arm. She's previously had steal symptoms in the right arm despite a normal arterial Doppler study on the right. She also has a nonhealing wound of the right great toe. She presents for diagnostic arteriography and also upper extremity arteriogram to determine if she might be a candidate for a right arm graft in the future.  PROCEDURE:  1. Ultrasound-guided access to the right common femoral artery 2. Aortogram with bilateral iliac arteriogram and bilateral lower extreme runoff 3. Arch aortogram 4. Selective innominate arteriogram  SURGEON: Judeth Cornfield. Scot Dock, MD, FACS  ANESTHESIA: local with sedation   EBL: minimal  TECHNIQUE: The patient was brought to the peripheral vascular lab. She received 1 mg of Versed and 50 g of fentanyl at 10:04 AM. I observed her during the entire procedure and her blood pressure, heart rate, and pulse ox were monitored by the nurse throughout the case. Both groins were prepped and draped in usual sterile fashion. Under ultrasound guidance, after the skin was anesthetized, the right common femoral artery was cannulated with a micropuncture needle and a micropuncture sheath introduced over the wire. This was exchanged for a 5 [place] sheath over a [employer] wire. Pigtail catheter was positioned at the L1 vertebral body and flush aortogram obtained. Catheter was in position above the aortic bifurcation and an oblique iliac projection was obtained. Next bilateral lower extreme runoff films were obtained.  Next the cath was advanced into the ascending aortic arch and arch aortogram obtained at a 40 LAO projection. I then exchanged this catheter for an H1 catheter and selectively cannulated the innominate artery. Selective  innominate arteriogram was obtained. I attempted to cannulate the subclavian artery for selective subclavian arteriogram [name] despite multiple attempts, because of the angulation was not successful. I did not want to persist given the risk of stroke as the area near the takeoff of the common carotid artery was being manipulated to try to cannulate the subclavian artery.  At the completion of the procedure the patient was transferred to the holding area for removal of the sheath. No immediate competitions were noted.  FINDINGS:  1. The infrarenal aorta, bilateral common iliac arteries, bilateral external iliac arteries and bilateral hypogastric arteries are patent. 2. On the right side, which is a symptom and excised, there is an eccentric plaque in the common femoral artery and then moderate to severe disease throughout the superficial femoral artery. The popliteal artery is occluded above the knee. There is reconstitution of the below-knee pop to artery with two-vessel runoff via the anterior tibial and peroneal arteries. The posterior tibial artery on the right is occluded. 3. In the left side there is a moderate stenosis in the proximal superficial femoral artery and also moderate disease at the adductor canal. The popliteal artery is patent. The posterior tibial artery and the left is occluded. There is two-vessel runoff in the left anterior tibial appeared arteries. 4. The arch is patent without significant occlusive disease. The innominate artery, proximal right common carotid artery, subclavian artery proximally, left common carotid artery and left subclavian artery are patent. There is no evidence of stenosis in the subclavian noted to the level of the mid humeral level.  CLINICAL NOTE: Given the nonhealing wound of the right great toe  with severe infrainguinal arterial occlusive disease I think this is a limb threatening situation. I think her only option for revascularization on the right would  be a right femoral to below-knee popliteal artery bypass. I will arrange for preoperative cardiac clearance and also vein mapped in the right leg. She could potentially have an AV graft placed in the right arm however I would not want to place a prosthetic graft in the arm until the issue with the toe is resolved.  [name], MD, FACS [facility]  DATE OF DICTATION:   10/12/2015

## 2015-10-12 NOTE — Progress Notes (Signed)
Site area: rt groin Site Prior to Removal:  Level 0 Pressure Applied For:  20 minutes Manual:   yes Patient Status During Pull:  stable Post Pull Site:  Level  0 Post Pull Instructions Given:  yes Post Pull Pulses Present: yes Dressing Applied:  tegaderm Bedrest begins @  1150 Comments:  IV saline locked

## 2015-10-12 NOTE — H&P (View-Only) (Signed)
[facility] [place]  Patient name: [name] MRN: [id number] DOB: 1935/05/30 Sex: female  REASON FOR CONSULT: Evaluate for new hemodialysis access. Referred by Dr. Lorrene Reid.  HPI: [name] is a 81 y.o. female, who has been a difficult access challenge. Her only option for a fistula in the right arm was a basilic vein transposition. She had a basilic vein transposition on the right but had significant steal symptoms and this had to be ligated. Subsequently, she had access placed in the left arm and required placement of a left upper arm graft because of the very small size of her veins. She has had several interventions by [facility] and according to the patient ultimately required a stent in the outflow vein. When no further options were possible to salvage the left upper arm graft a right IJ tunneled dialysis catheter was placed and she was sent for evaluation for new access.  The situation is further complicated by a wound on her right great toe. She had a toenail trimmed and developed a wound on the right great toe. I do not get any clear-cut history of claudication although her activity I believe is fairly limited. She denies rest pain in her feet.  Past Medical History  Diagnosis Date  . Hypertension   . Heart failure [facility]) December 07, 2012    Right side  . Hypercalcemia     2nd to hyperparathyroidism  . Thyroid disease     Hyperparathyroidism  . Thyroid disease     Hypothyroidism  . Dyslipidemia   . CHF (congestive heart failure) ([place])   . Myocardial infarction ([place]) 10/28/2010  . Peripheral vascular disease ([place])   . Short-term memory loss   . Stroke ([place]) 2010ish    no residual  . Shingles 2010 ish  . Anginal pain ([place])   . Coronary artery disease   . Diabetes mellitus without complication ([place])     TYPE 2  . Shortness of breath   . Parathyroid cyst ([place])   . Headache(784.0)     CALCIFICATION LT SKULL  . Arthritis     Gout  .  Cancer ([place])     kidney left removed  . Sleep apnea     Recently test in [place](NEG) NO CPAP   . ESRD (end stage renal disease) ([place])     dialysis T-TH-SAT  . Hypothyroidism     Family History  Problem Relation Age of Onset  . Diabetes Sister   . Heart disease Sister     before age 18  . Hyperlipidemia Sister   . Hypertension Sister   . Diabetes Sister   . Hyperlipidemia Sister   . Hypertension Sister     SOCIAL HISTORY: Social History   Social History  . Marital Status: Widowed    Spouse Name: N/A  . Number of Children: N/A  . Years of Education: N/A   Occupational History  . Not on file.   Social History Main Topics  . Smoking status: Former Smoker -- 46 years    Types: Cigarettes    Quit date: 01/08/1998  . Smokeless tobacco: Never Used  . Alcohol Use: No  . Drug Use: No  . Sexual Activity: Not on file   Other Topics Concern  . Not on file   Social History Narrative    Allergies  Allergen Reactions  . Macrobid [Nitrofurantoin Macrocrystal] Anaphylaxis and Rash  . Ace Inhibitors Other (See Comments)    hyperkalemia  .  Angiotensin Receptor Blockers Other (See Comments)    hyperkalemia  . Influenza Vaccines Itching, Rash and Other (See Comments)    blisters    Current Outpatient Prescriptions  Medication Sig Dispense Refill  . acetaminophen (TYLENOL) 500 MG tablet Take 1,000 mg by mouth 3 (three) times daily as needed for mild pain.     Marland Kitchen albuterol (PROVENTIL HFA;VENTOLIN HFA) 108 (90 BASE) MCG/ACT inhaler Inhale 2 puffs into the lungs every 6 (six) hours as needed for wheezing or shortness of breath.    Marland Kitchen albuterol (PROVENTIL) (2.5 MG/3ML) 0.083% nebulizer solution Take 2.5 mg by nebulization every 6 (six) hours as needed for wheezing or shortness of breath.     Marland Kitchen aspirin 81 MG chewable tablet Chew 325 mg by mouth every evening.     . calcitRIOL (ROCALTROL) 0.25 MCG capsule Take 0.25 mcg by mouth 3 (three) times a week. Tuesday, Thursday, and  Saturday.    . cinacalcet (SENSIPAR) 30 MG tablet Take 30 mg by mouth daily with supper.    . fenofibrate 54 MG tablet Take 54 mg by mouth daily.     . folic acid-vitamin b complex-vitamin c-selenium-zinc (DIALYVITE) 3 MG TABS tablet Take 1 tablet by mouth daily.    Marland Kitchen gabapentin (NEURONTIN) 400 MG capsule Take 400 mg by mouth 3 (three) times daily.    . insulin aspart (NOVOLOG) 100 UNIT/ML injection Inject 0-22 Units into the skin 3 (three) times daily with meals. CBG: 0-70= 0 units 71-100=9 units after meal 101-150=14 before meal 151-200=16 units before meal 201-250=17 units before meal 251-300=18 units before meal 301-350=19 units before meal 351-400=20 units before meal Over 400=22 units before meal    . levothyroxine (SYNTHROID, LEVOTHROID) 88 MCG tablet Take 88 mcg by mouth daily before breakfast.    . nitroGLYCERIN (NITROSTAT) 0.4 MG SL tablet Place 0.4 mg under the tongue every 5 (five) minutes as needed for chest pain.    . Omega-3 Fatty Acids (FISH OIL) 1000 MG CAPS Take 1,000 mg by mouth 2 (two) times daily.    . insulin glargine (LANTUS) 100 UNIT/ML injection Inject 40 Units into the skin at bedtime. Reported on 10/07/2015    . oxyCODONE-acetaminophen (ROXICET) 5-325 MG per tablet Take 1-2 tablets by mouth every 4 (four) hours as needed for severe pain. (Patient not taking: Reported on 10/07/2015) 20 tablet 0  . oxyCODONE-acetaminophen (ROXICET) 5-325 MG per tablet Take 1-2 tablets by mouth every 4 (four) hours as needed for severe pain. (Patient not taking: Reported on 10/07/2015) 30 tablet 0  . ranolazine (RANEXA) 500 MG 12 hr tablet Take 500 mg by mouth 2 (two) times daily. Reported on 10/07/2015    . sevelamer carbonate (RENVELA) 800 MG tablet Take 1,600 mg by mouth 3 (three) times daily with meals. Reported on 10/07/2015    . ticagrelor (BRILINTA) 90 MG TABS tablet Take 90 mg by mouth 2 (two) times daily. Reported on 10/07/2015     No current facility-administered medications for this  visit.    REVIEW OF SYSTEMS:  [X]  denotes positive finding, [ ]  denotes negative finding Cardiac  Comments:  Chest pain or chest pressure:    Shortness of breath upon exertion:    Short of breath when lying flat:    Irregular heart rhythm:        Vascular    Pain in calf, thigh, or hip brought on by ambulation: X Mild bilateral calf  Pain in feet at night that wakes you up from your sleep:  Blood clot in your veins:    Leg swelling:         Pulmonary    Oxygen at home:    Productive cough:     Wheezing:         Neurologic    Sudden weakness in arms or legs:     Sudden numbness in arms or legs:     Sudden onset of difficulty speaking or slurred speech:    Temporary loss of vision in one eye:     Problems with dizziness:         Gastrointestinal    Blood in stool:     Vomited blood:         Genitourinary    Burning when urinating:     Blood in urine:        Psychiatric    Major depression:         Hematologic    Bleeding problems:    Problems with blood clotting too easily:        Skin    Rashes or ulcers:        Constitutional    Fever or chills:      PHYSICAL EXAM: Filed Vitals:   10/07/15 1522 10/07/15 1526  BP: 165/70 151/74  Pulse: 65   Height: 5' (1.524 m)   Weight: 196 lb 6.4 oz (89.086 kg)   SpO2: 98%    Body mass index is 38.36 kg/(m^2).  GENERAL: The patient is a morbidly obese female, in no acute distress. The vital signs are documented above. CARDIAC: There is a regular rate and rhythm.  VASCULAR: She has palpable femoral pulses although they are difficult to palpate because of her obesity. PULMONARY: There is good air exchange bilaterally without wheezing or rales. ABDOMEN: Soft and non-tender with normal pitched bowel sounds.  MUSCULOSKELETAL: There are no major deformities. NEUROLOGIC: No focal weakness or paresthesias are detected. SKIN: She has a wound on her right great toe. PSYCHIATRIC: The patient has a normal affect.  DATA:    UPPER EXTREMITY ARTERIAL DUPLEX: I have independently interpreted her upper chimney arterial duplex scan. On the right side there is a triphasic radial and ulnar signal.   On the left side there is a biphasic radial and ulnar signal. Allen's test is normal on the left and slightly abnormal on the right.  UPPER EXTREMITY VEIN MAP:  On the right side, the forearm cephalic vein looks very small. The upper arm cephalic vein looks small the antecubital level but is larger further proximally. The basilic vein was very short on the right. On the left side it was a graft in the upper arm the cephalic vein did not look adequate in the upper arm or forearm.  MEDICAL ISSUES:  STAGE V CHRONIC KIDNEY DISEASE:  She has no further options for access in the left arm. On the right side she could potentially have an AV graft placed however she previously had steal in the right arm. I think it might still be possible to place a graft if I used a 4-7 mm PTFE graft.Given the wound on her right foot she is clearly not a candidate for a right thigh graft. She also has evidence of significant peripheral vascular disease on the left and is not a good candidate for a left thigh graft. In addition because of her obesity she is not a good candidate for thigh graft. Given that she needs an arteriogram because of her wound on the right foot which  is a limb threatening problem, I have recommended that we also study the right upper extremity arteries in order to help determine if she is a candidate for a right upper arm graft. I will make further recommendations pending the results of her arteriogram.  PERIPHERAL VASCULAR DISEASE WITH WOUND ON RIGHT GREAT TOE: Given her diabetes, peripheral vascular disease, and the wound on her right great toe this is clearly a limb threatening situation. I have recommended that we proceed with arteriography in order to determine if there are any options for revascularization. I have reviewed with  the patient the indications for arteriography. In addition, I have reviewed the potential complications of arteriography including but not limited to: Bleeding, arterial injury, arterial thrombosis, dye action, renal insufficiency, or other unpredictable medical problems. I have explained to the patient that if we find disease amenable to angioplasty we could potentially address this at the same time. I have discussed the potential complications of angioplasty and stenting, including but not limited to: Bleeding, arterial thrombosis, arterial injury, dissection, or the need for surgical intervention. Her arteriogram is scheduled for 10/12/2015. I'll make further recommendations pending these results.  HYPERTENSION: The patient's initial blood pressure today was elevated. We repeated this and this was still elevated. We have encouraged the patient to follow up with their primary care physician for management of their blood pressure.  [name] [facility] [place]: [phone]

## 2015-10-12 NOTE — Discharge Instructions (Signed)
Angiogram, Care After °Refer to this sheet in the next few weeks. These instructions provide you with information about caring for yourself after your procedure. Your health care provider may also give you more specific instructions. Your treatment has been planned according to current medical practices, but problems sometimes occur. Call your health care provider if you have any problems or questions after your procedure. °WHAT TO EXPECT AFTER THE PROCEDURE °After your procedure, it is typical to have the following: °· Bruising at the catheter insertion site that usually fades within 1-2 weeks. °· Blood collecting in the tissue (hematoma) that may be painful to the touch. It should usually decrease in size and tenderness within 1-2 weeks. °HOME CARE INSTRUCTIONS °· Take medicines only as directed by your health care provider. °· You may shower 24-48 hours after the procedure or as directed by your health care provider. Remove the bandage (dressing) and gently wash the site with plain soap and water. Pat the area dry with a clean towel. Do not rub the site, because this may cause bleeding. °· Do not take baths, swim, or use a hot tub until your health care provider approves. °· Check your insertion site every day for redness, swelling, or drainage. °· Do not apply powder or lotion to the site. °· Do not lift over 10 lb (4.5 kg) for 5 days after your procedure or as directed by your health care provider. °· Ask your health care provider when it is okay to: °¨ Return to work or school. °¨ Resume usual physical activities or sports. °¨ Resume sexual activity. °· Do not drive home if you are discharged the same day as the procedure. Have someone else drive you. °· You may drive 24 hours after the procedure unless otherwise instructed by your health care provider. °· Do not operate machinery or power tools for 24 hours after the procedure or as directed by your health care provider. °· If your procedure was done as an  outpatient procedure, which means that you went home the same day as your procedure, a responsible adult should be with you for the first 24 hours after you arrive home. °· Keep all follow-up visits as directed by your health care provider. This is important. °SEEK MEDICAL CARE IF: °· You have a fever. °· You have chills. °· You have increased bleeding from the catheter insertion site. Hold pressure on the site and call 911. °SEEK IMMEDIATE MEDICAL CARE IF: °· You have unusual pain at the catheter insertion site. °· You have redness, warmth, or swelling at the catheter insertion site. °· You have drainage (other than a small amount of blood on the dressing) from the catheter insertion site. °· The catheter insertion site is bleeding, and the bleeding does not stop after 30 minutes of holding steady pressure on the site. °· The area near or just beyond the catheter insertion site becomes pale, cool, tingly, or numb. °  °This information is not intended to replace advice given to you by your health care provider. Make sure you discuss any questions you have with your health care provider. °  °Document Released: 03/03/2005 Document Revised: 09/05/2014 Document Reviewed: 01/16/2013 °[employer]vier Interactive Patient Education ©2016 [employer] ° °

## 2015-10-13 ENCOUNTER — Other Ambulatory Visit: Payer: Self-pay

## 2015-10-13 ENCOUNTER — Encounter (HOSPITAL_COMMUNITY): Payer: Self-pay | Admitting: Vascular Surgery

## 2015-10-14 ENCOUNTER — Ambulatory Visit (INDEPENDENT_AMBULATORY_CARE_PROVIDER_SITE_OTHER): Payer: Medicare Other | Admitting: Physician Assistant

## 2015-10-14 ENCOUNTER — Encounter: Payer: Self-pay | Admitting: Physician Assistant

## 2015-10-14 VITALS — BP 110/58 | HR 96 | Ht 60.0 in | Wt 195.0 lb

## 2015-10-14 DIAGNOSIS — I2510 Atherosclerotic heart disease of native coronary artery without angina pectoris: Secondary | ICD-10-CM | POA: Diagnosis not present

## 2015-10-14 DIAGNOSIS — Z01810 Encounter for preprocedural cardiovascular examination: Secondary | ICD-10-CM

## 2015-10-14 DIAGNOSIS — I739 Peripheral vascular disease, unspecified: Secondary | ICD-10-CM

## 2015-10-14 DIAGNOSIS — L97513 Non-pressure chronic ulcer of other part of right foot with necrosis of muscle: Secondary | ICD-10-CM

## 2015-10-14 NOTE — Progress Notes (Signed)
Cardiology Office Note   Date:  10/14/2015   ID:  [name], DOB 1935/01/12, MRN [id number]  PCP:  Rochel Brome, MD  Cardiologist:  Dr. Geraldo Pitter (previously Dr. Lia Foyer patient)  Chief Complaint  Patient presents with  . New Patient (Initial Visit)    seen with DOD Dr. Mare Ferrari  . Pre-op Exam      History of Present Illness: [name] is a 81 y.o. female who presents for preop clearance for R fem pop referred by Dr. Scot Dock. She has a PMH of hyperlipidemia, takotsubo cardiomyopathy, CVA, CAD, DM, OSA, kidney CA s/p L nephrectomy, OSA, hypothyroidism and ESRD on HD TTS. She did have 2 back-to-back cardiac catheterization in 2005 done by Dr. Lia Foyer, since then she has been followed by Dr. Geraldo Pitter in [place]. Her originally cardiac catheterization was done on 05/08/2004 for chest pain and large wall motion abnormalities seen on echocardiogram involving distal anterior, anteroapical, and the distal inferior segment. Cardiac catheterization showed bifurcation disease involving LAD and D2 roughly 70-75% narrowing. The case has been discussed with Dr. Roxan Hockey, she was eventually return to the Cath Lab on 05/12/2004, cardiac catheterization shows bifurcation disease in the LAD and diagonal but appears to be 60-70%, compared to the previous study to wall motion abnormality has improved. She was diagnosed was Takotsubo cardiomyopathy and was discharged on medical therapy. According to the patient, she received a stent to the heart in [facility] around the same time, she was under the impression the cardiac catheterization was done in [place] not here. She says she was given a card, however did not bring it today. She denies any further cardiac catheterization since 2005. She just had a echocardiogram in January 2017 which reportedly was normal. She has been on hemodialysis for the past 2 years. She previously had a left upper extremity AV fistula placed, however  due to steal syndrome and other issues, it has not been functional. She has been using a R tunnelled catheter since. She was last seen by her cardiologist Dr. Geraldo Pitter on 08/17/2015, an echocardiogram was repeated on 09/02/2015, based on outside record, EF 55-60%, concentric hypertrophy of LV, grade II diastolic dysfunction, no RWMA, mild MR.   For the past 5 weeks, patient has been having pain in the right big toe with nonhealing ulcers. Eventually she underwent lower extremity angiography by Dr. Scot Dock on 10/12/2015, the finding was consistent with severe inferainguinal arterial occlusive disease which is potentially limb threatening. Her only option for revascularization would be right femoral below-knee popliteal artery bypass. She presents today for cardiology pre-op clearance. She states she has not really experienced chest discomfort since 2005. Her chronic issues including nonhealing right toe ulcer does inhibit her ability to get around, however she continued to walk around with a walker. She denies any recent fever, chill, or cough. She states her only issue is the RLE nonhealing ulcer, otherwise has been doing well.    Past Medical History  Diagnosis Date  . Hypertension   . Heart failure [facility]) December 07, 2012    Right side  . Hypercalcemia     2nd to hyperparathyroidism  . Thyroid disease     Hyperparathyroidism  . Thyroid disease     Hypothyroidism  . Dyslipidemia   . CHF (congestive heart failure) ([place])   . Myocardial infarction ([place]) 10/28/2010  . Peripheral vascular disease ([place])   . Short-term memory loss   . Stroke [facility]) 2010ish    no residual  . Shingles  2010 ish  . Anginal pain ([place])   . Coronary artery disease   . Diabetes mellitus without complication ([place])     TYPE 2  . Shortness of breath   . Parathyroid cyst ([place])   . Headache(784.0)     CALCIFICATION LT SKULL  . Arthritis     Gout  . Cancer ([place])     kidney left removed  . Sleep apnea     Recently test in  [place](NEG) NO CPAP   . ESRD (end stage renal disease) ([place])     dialysis T-TH-SAT  . Hypothyroidism     Past Surgical History  Procedure Laterality Date  . Nephrectomy Left 2005  . Abdominal hysterectomy  2005  . Cholecystectomy  2005  . [place] stent    . Coronary stent placement Left 10/2010  . Av fistula placement Left 01/09/2013    Procedure: ARTERIOVENOUS (AV) FISTULA CREATION;  Surgeon: Mal Misty, MD;  Location: [place];  Service: Vascular;  Laterality: Left;  Creation of Left Brachial Cephalic Arteriovenous Fistula  . Bascilic vein transposition Left 07/30/2013    Procedure: 1ST Stage BASCILIC VEIN TRANSPOSITION;  Surgeon: Angelia Mould, MD;  Location: [place];  Service: Vascular;  Laterality: Left;  . Cardiac catheterization      2013 STENT PLACED  . Cataract extraction      BILAT  . Bascilic vein transposition Left 12/17/2013    Procedure: BASILIC VEIN TRANSPOSITION - LEFT ARM 2ND STAGE;  Surgeon: Angelia Mould, MD;  Location: [place];  Service: Vascular;  Laterality: Left;  . Insertion of dialysis catheter Left 03/04/2014    Procedure: INSERTION OF DIALYSIS CATHETER;  Surgeon: Conrad [place], MD;  Location: [place];  Service: Vascular;  Laterality: Left;  Marland Kitchen Eye surgery Bilateral     cataract surgery  . Appendectomy    . Bascilic vein transposition Right 04/18/2014    Procedure: BASILIC VEIN TRANSPOSITION;  Surgeon: Angelia Mould, MD;  Location: [place];  Service: Vascular;  Laterality: Right;  . Ligation of arteriovenous  fistula Right 04/19/2014    Procedure: LIGATION OF ARTERIOVENOUS  FISTULA;  Surgeon: Serafina Mitchell, MD;  Location: [place];  Service: Vascular;  Laterality: Right;  . Av fistula placement Left 05/23/2014    Procedure: INSERTION OF ARTERIOVENOUS (AV) GORE-TEX GRAFT ARM;  Surgeon: Angelia Mould, MD;  Location: [place];  Service: Vascular;  Laterality: Left;  . Shuntogram Left 02/10/2014    Procedure: FISTULOGRAM;  Surgeon: Angelia Mould, MD;  Location: [facility] C[facility] LAB;  Service: Cardiovascular;  Laterality: Left;  . Peripheral vascular catheterization N/A 10/12/2015    Procedure: Abdominal Aortogram w/Lower Extremity;  Surgeon: Angelia Mould, MD;  Location: [place] CV LAB;  Service: Cardiovascular;  Laterality: N/A;  . Peripheral vascular catheterization N/A 10/12/2015    Procedure: Upper Extremity Angiography/Right;  Surgeon: Angelia Mould, MD;  Location: [place] CV LAB;  Service: Cardiovascular;  Laterality: N/A;     Current Outpatient Prescriptions  Medication Sig Dispense Refill  . acetaminophen (TYLENOL) 500 MG tablet Take 1,000 mg by mouth 3 (three) times daily as needed for mild pain.     Marland Kitchen albuterol (PROVENTIL HFA;VENTOLIN HFA) 108 (90 BASE) MCG/ACT inhaler Inhale 2 puffs into the lungs every 6 (six) hours as needed for wheezing or shortness of breath.    Marland Kitchen albuterol (PROVENTIL) (2.5 MG/3ML) 0.083% nebulizer solution Take 2.5 mg by nebulization every 6 (six) hours as needed for wheezing or shortness of  breath.     Marland Kitchen aspirin 325 MG tablet Take 325 mg by mouth daily.    . calcitRIOL (ROCALTROL) 0.25 MCG capsule Take 0.25 mcg by mouth 3 (three) times a week. Tuesday, Thursday, and Saturday.    . calcium acetate (PHOSLO) 667 MG capsule Take 1,334 mg by mouth 3 (three) times daily with meals.     . cephALEXin (KEFLEX) 250 MG capsule Take 1 capsule (250 mg total) by mouth 3 (three) times daily. 42 capsule 1  . cinacalcet (SENSIPAR) 30 MG tablet Take 30 mg by mouth daily with supper.    . fenofibrate 54 MG tablet Take 54 mg by mouth daily.     . folic acid-vitamin b complex-vitamin c-selenium-zinc (DIALYVITE) 3 MG TABS tablet Take 1 tablet by mouth daily.    Marland Kitchen FOSRENOL 1000 MG chewable tablet Chew 1,000 mg by mouth 3 (three) times daily before meals.    . gabapentin (NEURONTIN) 400 MG capsule Take 400 mg by mouth 3 (three) times daily.    . insulin aspart (NOVOLOG) 100 UNIT/ML injection Inject 0-22  Units into the skin 3 (three) times daily with meals. CBG: 0-70= 0 units 71-100=9 units after meal 101-150=14 before meal 151-200=16 units before meal 201-250=17 units before meal 251-300=18 units before meal 301-350=19 units before meal 351-400=20 units before meal Over 400=22 units before meal    . levothyroxine (SYNTHROID, LEVOTHROID) 88 MCG tablet Take 88 mcg by mouth daily before breakfast.    . nitroGLYCERIN (NITROSTAT) 0.4 MG SL tablet Place 0.4 mg under the tongue every 5 (five) minutes as needed for chest pain.    . Omega-3 Fatty Acids (FISH OIL) 1000 MG CAPS Take 1,000 mg by mouth 2 (two) times daily.     No current facility-administered medications for this visit.    Allergies:   Macrobid; Ace inhibitors; Angiotensin receptor blockers; and Influenza vaccines    Social History:  The patient  reports that she quit smoking about 17 years ago. Her smoking use included Cigarettes. She quit after 46 years of use. She has never used smokeless tobacco. She reports that she does not drink alcohol or use illicit drugs.   Family History:  The patient's family history includes Diabetes in her sister and sister; Heart disease in her sister; Hyperlipidemia in her sister and sister; Hypertension in her sister and sister.    ROS:  Please see the history of present illness.   Otherwise, review of systems are positive for leg swelling, R foot pain.   All other systems are reviewed and negative.    PHYSICAL EXAM: VS:  BP 110/58 mmHg  Pulse 96  Ht 5' (1.524 m)  Wt 195 lb (88.451 kg)  BMI 38.08 kg/m2 , BMI Body mass index is 38.08 kg/(m^2). GEN: Well nourished, well developed, in no acute distress HEENT: normal Neck: no JVD, carotid bruits, or masses Cardiac: RRR; no murmurs, rubs, or gallops,no edema Tunneled catheter noted on R side Respiratory:  clear to auscultation bilaterally, normal work of breathing GI: soft, nontender, nondistended, + BS MS: no deformity or atrophy Skin:  Decreased pulse in bilateral LE, R foot erythematous, tender to touch, part of R toe black, no active drainage Neuro:  Strength and sensation are intact Psych: euthymic mood, full affect   EKG:  EKG is not ordered today.   Recent Labs: 10/12/2015: BUN 51*; Creatinine, Ser 4.80*; Hemoglobin 14.6; Potassium 4.0; Sodium 138    Lipid Panel    Component Value Date/Time   CHOL  06/05/2010 0230    115        ATP III CLASSIFICATION:  <200     mg/dL   Desirable  200-239  mg/dL   Borderline High  >=240    mg/dL   High          TRIG 122 06/05/2010 0230   HDL 37* 06/05/2010 0230   CHOLHDL 3.1 06/05/2010 0230   VLDL 24 06/05/2010 0230   LDLCALC  06/05/2010 0230    54        Total Cholesterol/HDL:CHD Risk Coronary Heart Disease Risk Table                     Men   Women  1/2 Average Risk   3.4   3.3  Average Risk       5.0   4.4  2 X Average Risk   9.6   7.1  3 X Average Risk  23.4   11.0        Use the calculated Patient Ratio above and the CHD Risk Table to determine the patient's CHD Risk.        ATP III CLASSIFICATION (LDL):  <100     mg/dL   Optimal  100-129  mg/dL   Near or Above                    Optimal  130-159  mg/dL   Borderline  160-189  mg/dL   High  >190     mg/dL   Very High      Wt Readings from Last 3 Encounters:  10/14/15 195 lb (88.451 kg)  10/12/15 198 lb (89.812 kg)  10/07/15 196 lb 6.4 oz (89.086 kg)      Other studies Reviewed: Additional studies/ records that were reviewed today include:   Cardiac cath 05/08/2004  CONCLUSIONS: 1. Significant wall motion abnormality involving the distal anterior,  anteroapical, and distal inferior segments. 2. Bifurcational disease involving the LAD diagonal 2 and LAD.  DISCUSSION: The patient has a very large wall motion abnormality. This wall motion abnormality seems to be, to some extent, larger than the vascular territory supplied by the LAD diagonal 2 distribution. Nonetheless, it is  possible that this could be responsible, although I doubt this is the case. Furthermore, the patient provides a history of rather severe emotional distress. This could represent a __________ presentation with a large anteroapical wall motion abnormality in the setting of emotional distress. At the present time, we will monitor her closely in the CCU. Importantly, the patient's creatinine is elevated, we used 61 mL of total contrast, her LVEDP is low, and we will give her normal saline bolus to try to reduce post-procedural change in renal function. If she does well, we will potentially plan to study her later in the week to see if the LAD diagonal is less severe, and also to see if, in fact, the anterior wall has recovered. I have asked Dr. Merilynn Finland of the surgical service to see her as she may need revascularization surgery with the LAD diagonal disease. We will get his opinion, and we will follow her closely.  Cardiac cath 05/12/2004  CONCLUSIONS: 1. Compared to the previous study wall motion is improved, but still  involves the distal inferior wall. 2. There is bifurcational disease of the left anterior descending and  diagonal branch but it appears to be 60 to perhaps 70. It is not clear  that this represents flow-limiting disease.  DISPOSITION: I plan to review the films carefully with Dr. Roxan Hockey before making a final disposition. Her heparin will be discontinued. She will be treated medically at the present time. Further decision will be pending review with the surgeons.  LE angiography 10/12/2015 FINDINGS:  1. The infrarenal aorta, bilateral common iliac arteries, bilateral external iliac arteries and bilateral hypogastric arteries are patent. 2. On the right side, which is a symptom and excised, there is an eccentric plaque in the common femoral artery and then moderate to severe disease throughout the superficial  femoral artery. The popliteal artery is occluded above the knee. There is reconstitution of the below-knee pop to artery with two-vessel runoff via the anterior tibial and peroneal arteries. The posterior tibial artery on the right is occluded. 3. In the left side there is a moderate stenosis in the proximal superficial femoral artery and also moderate disease at the adductor canal. The popliteal artery is patent. The posterior tibial artery and the left is occluded. There is two-vessel runoff in the left anterior tibial appeared arteries. 4. The arch is patent without significant occlusive disease. The innominate artery, proximal right common carotid artery, subclavian artery proximally, left common carotid artery and left subclavian artery are patent. There is no evidence of stenosis in the subclavian noted to the level of the mid humeral level.  CLINICAL NOTE: Given the nonhealing wound of the right great toe with severe infrainguinal arterial occlusive disease I think this is a limb threatening situation. I think her only option for revascularization on the right would be a right femoral to below-knee popliteal artery bypass. I will arrange for preoperative cardiac clearance and also vein mapped in the right leg. She could potentially have an AV graft placed in the right arm however I would not want to place a prosthetic graft in the arm until the issue with the toe is resolved.   Review of the above records demonstrates:   Previous cath in 2005, recent LE angiography showed potential critical limb ischemia. Here for preop consult.     ASSESSMENT AND PLAN:  1. preop clearance for R below knee fem pop bypass  - reviewed echocardiogram from the patient's primary cardiologist office, normal EF. Although we are not sure if patient truly had a stent in 2005 in [place], her previous cardiac catheterization was reviewed and felt she likely had Takotsubo cardiomyopathy at the time.  - I have  discussed with DOD Dr. Mare Ferrari, given lack of chest discomfort and urgent need for revascularization therapy due critical limb ischemia, no further cardiac workup was felt necessary in this case. She is at higher surgical risk given her comorbidities, however the risk is acceptable given proposed benefit of the procedure.  2. PAD with critical limb ischemia in RLE requiring R below knee fem pop bypass  3. CAD: last cath 2005, see above for report. Unclear if she truly ever received a stent in [place] as she believed her two cath in 2005 was done in [place] when they were clearly done here. I am not sure if she later went to [place] and got a stent there. Either way, she has not experienced any chest discomfort.   4. takotsubo cardiomyopathy: despite cath in 2005 showing CAD, it did not explain her significant wall motion abnormality on echo. Repeat cath showed improved wall motion likely represent takotsubo cardiomyopathy  5. ESRD on HD TTS  - kidney CA s/p L nephrectomy  Current medicines are reviewed at length with the patient today.  The patient does not have concerns regarding medicines.  The following changes have been made:  no change  Labs/ tests ordered today include:  No orders of the defined types were placed in this encounter.     Disposition:   FU with Dr. Geraldo Pitter after vascular surgery  Signed, Almyra Deforest, [place]  10/14/2015 5:18 PM    [facility] [place], [place], [place]  [address] Phone: [phone]; Fax: [phone]

## 2015-10-14 NOTE — Patient Instructions (Signed)
Medication Instructions:  Your physician recommends that you continue on your current medications as directed. Please refer to the Current Medication list given to you today.   Labwork: None ordered  Testing/Procedures:   Follow-Up: Your physician recommends that you schedule a follow-up appointment with your primary cardilogist.   Any Other Special Instructions Will Be Listed Below (If Applicable). YOU HAVE BEEN CLEARED FOR SURGERY. WE WILL SEND DR. DICKSON A NOTE.  If you need a refill on your cardiac medications before your next appointment, please call your pharmacy.

## 2015-10-16 ENCOUNTER — Encounter: Payer: Self-pay | Admitting: Vascular Surgery

## 2015-10-19 ENCOUNTER — Encounter: Payer: Self-pay | Admitting: Physician Assistant

## 2015-10-20 ENCOUNTER — Ambulatory Visit (HOSPITAL_COMMUNITY)
Admission: RE | Admit: 2015-10-20 | Discharge: 2015-10-20 | Disposition: A | Discharge status: Home / Self Care | Payer: Medicare Other | Source: Ambulatory Visit | Attending: Vascular Surgery | Admitting: Vascular Surgery

## 2015-10-20 DIAGNOSIS — I739 Peripheral vascular disease, unspecified: Secondary | ICD-10-CM

## 2015-10-20 DIAGNOSIS — I509 Heart failure, unspecified: Secondary | ICD-10-CM | POA: Insufficient documentation

## 2015-10-20 DIAGNOSIS — E1122 Type 2 diabetes mellitus with diabetic chronic kidney disease: Secondary | ICD-10-CM | POA: Diagnosis not present

## 2015-10-20 DIAGNOSIS — Z01810 Encounter for preprocedural cardiovascular examination: Secondary | ICD-10-CM | POA: Diagnosis not present

## 2015-10-20 DIAGNOSIS — N186 End stage renal disease: Secondary | ICD-10-CM | POA: Insufficient documentation

## 2015-10-20 DIAGNOSIS — Z992 Dependence on renal dialysis: Secondary | ICD-10-CM | POA: Diagnosis not present

## 2015-10-20 DIAGNOSIS — I132 Hypertensive heart and chronic kidney disease with heart failure and with stage 5 chronic kidney disease, or end stage renal disease: Secondary | ICD-10-CM | POA: Insufficient documentation

## 2015-10-20 DIAGNOSIS — E785 Hyperlipidemia, unspecified: Secondary | ICD-10-CM | POA: Insufficient documentation

## 2015-10-20 NOTE — Pre-Procedure Instructions (Signed)
YANIA MIRABILE  10/20/2015      [id number] - [place], [address] AT [address] [place] [address] Phone: [phone] Fax: [phone]    Your procedure is scheduled on Tuesday, February 28th, 2017.  Report to [facility] Admitting at 5:30 A.M.  Call this number if you have problems the morning of surgery:  [phone]   Remember:  Do not eat food or drink liquids after midnight.   Take these medicines the morning of surgery with A SIP OF WATER:  Tylenol if needed, albuterol inhaler/ nebulizer, gabapentin ,levothyroxine  (STOP ASPIRIN, FISH OIL, IBUPROFEN/ ADVIL/ MOTRIN, GOODY POWDERS/ BC'S, HERBAL MEDICINES, VITAMINS)   Do not wear jewelry, make-up or nail polish.  Do not wear lotions, powders, or perfumes.    Do not shave 48 hours prior to surgery.    Do not bring valuables to the hospital.   [facility] is not responsible for any belongings or valuables.  Contacts, dentures or bridgework may not be worn into surgery.  Leave your suitcase in the car.  After surgery it may be brought to your room.  For patients admitted to the hospital, discharge time will be determined by your treatment team.  Patients discharged the day of surgery will not be allowed to drive home.   Special instructions:  See attached.   Please read over the following fact sheets that you were given. Pain Booklet, Coughing and Deep Breathing, Blood Transfusion Information, MRSA Information and Surgical Site Infection Prevention    How to Manage Your Diabetes Before Surgery   Why is it important to control my blood sugar before and after surgery?   Improving blood sugar levels before and after surgery helps healing and can limit problems.  A way of improving blood sugar control is eating a healthy diet by:  - Eating less sugar and carbohydrates  - Increasing activity/exercise  - Talk with your doctor about reaching  your blood sugar goals  High blood sugars (greater than 180 mg/dL) can raise your risk of infections and slow down your recovery so you will need to focus on controlling your diabetes during the weeks before surgery.  Make sure that the doctor who takes care of your diabetes knows about your planned surgery including the date and location.  How do I manage my blood sugars before surgery?   Check your blood sugar at least 4 times a day, 2 days before surgery to make sure that they are not too high or low.   Check your blood sugar the morning of your surgery when you wake up and every 2 hours until you get to the Short-Stay unit.  If your blood sugar is less than 70 mg/dL, you will need to treat for low blood sugar by:  Treat a low blood sugar (less than 70 mg/dL) with 1/2 cup of clear juice (cranberry or apple), 4 glucose tablets, OR glucose gel.  Recheck blood sugar in 15 minutes after treatment (to make sure it is greater than 70 mg/dL).  If blood sugar is not greater than 70 mg/dL on re-check, call [phone] for further instructions.   Report your blood sugar to the Short-Stay nurse when you get to Short-Stay.  References:  [facility], 2007 "How to Manage your Diabetes Before and After Surgery".  What do I do about my diabetes medications?   Do not take oral diabetes medicines (  pills) the morning of surgery.     Do not take other diabetes injectables the day of surgery including Byetta, Victoza, Bydureon, and Trulicity.    If your CBG is greater than 220 mg/dL, you may take 1/2 of your sliding scale (correction) dose of insulin.   For patients with "Insulin Pumps":  Contact your diabetes doctor for specific instructions before surgery.   Decrease basal insulin rates by 20% at midnight the night before surgery.  Note that if your surgery is planned to be longer than 2 hours, your insulin pump will be removed and intravenous (IV) insulin  will be started and managed by the nurses and anesthesiologist.  You will be able to restart your insulin pump once you are awake and able to manage it.  Make sure to bring insulin pump supplies to the hospital with you in case your site needs to be changed.

## 2015-10-21 ENCOUNTER — Encounter (HOSPITAL_COMMUNITY): Payer: Self-pay

## 2015-10-21 ENCOUNTER — Encounter (HOSPITAL_COMMUNITY)
Admission: RE | Admit: 2015-10-21 | Discharge: 2015-10-21 | Disposition: A | Discharge status: Home / Self Care | Payer: Medicare Other | Source: Ambulatory Visit | Attending: Vascular Surgery | Admitting: Vascular Surgery

## 2015-10-21 ENCOUNTER — Encounter: Payer: Self-pay | Admitting: Vascular Surgery

## 2015-10-21 ENCOUNTER — Ambulatory Visit (INDEPENDENT_AMBULATORY_CARE_PROVIDER_SITE_OTHER): Payer: Medicare Other | Admitting: Vascular Surgery

## 2015-10-21 VITALS — BP 140/71 | HR 79 | Temp 97.9°F | Resp 18 | Ht 60.0 in | Wt 196.5 lb

## 2015-10-21 DIAGNOSIS — Z01812 Encounter for preprocedural laboratory examination: Secondary | ICD-10-CM | POA: Insufficient documentation

## 2015-10-21 DIAGNOSIS — Z0183 Encounter for blood typing: Secondary | ICD-10-CM | POA: Insufficient documentation

## 2015-10-21 DIAGNOSIS — I70261 Atherosclerosis of native arteries of extremities with gangrene, right leg: Secondary | ICD-10-CM | POA: Diagnosis not present

## 2015-10-21 LAB — COMPREHENSIVE METABOLIC PANEL
ALBUMIN: 2.9 g/dL — AB (ref 3.5–5.0)
ALT: 13 U/L — ABNORMAL LOW (ref 14–54)
ANION GAP: 16 — AB (ref 5–15)
AST: 16 U/L (ref 15–41)
Alkaline Phosphatase: 82 U/L (ref 38–126)
BILIRUBIN TOTAL: 0.4 mg/dL (ref 0.3–1.2)
BUN: 41 mg/dL — ABNORMAL HIGH (ref 6–20)
CO2: 21 mmol/L — AB (ref 22–32)
Calcium: 9.9 mg/dL (ref 8.9–10.3)
Chloride: 102 mmol/L (ref 101–111)
Creatinine, Ser: 5.17 mg/dL — ABNORMAL HIGH (ref 0.44–1.00)
GFR calc Af Amer: 8 mL/min — ABNORMAL LOW (ref >60)
GFR calc non Af Amer: 7 mL/min — ABNORMAL LOW (ref >60)
GLUCOSE: 180 mg/dL — AB (ref 65–99)
POTASSIUM: 4.2 mmol/L (ref 3.5–5.1)
SODIUM: 139 mmol/L (ref 135–145)
TOTAL PROTEIN: 7 g/dL (ref 6.5–8.1)

## 2015-10-21 LAB — CBC
HCT: 39.8 % (ref 36.0–46.0)
Hemoglobin: 12.6 g/dL (ref 12.0–15.0)
MCH: 28.9 pg (ref 26.0–34.0)
MCHC: 31.7 g/dL (ref 30.0–36.0)
MCV: 91.3 fL (ref 78.0–100.0)
PLATELETS: 326 10*3/uL (ref 150–400)
RBC: 4.36 MIL/uL (ref 3.87–5.11)
RDW: 13.7 % (ref 11.5–15.5)
WBC: 10.4 10*3/uL (ref 4.0–10.5)

## 2015-10-21 LAB — TYPE AND SCREEN
ABO/RH(D): A POS
ANTIBODY SCREEN: NEGATIVE

## 2015-10-21 LAB — SURGICAL PCR SCREEN
MRSA, PCR: NEGATIVE
Staphylococcus aureus: NEGATIVE

## 2015-10-21 LAB — GLUCOSE, CAPILLARY: GLUCOSE-CAPILLARY: 161 mg/dL — AB (ref 65–99)

## 2015-10-21 LAB — APTT: APTT: 31 s (ref 24–37)

## 2015-10-21 LAB — PROTIME-INR
INR: 1.08 (ref 0.00–1.49)
Prothrombin Time: 14.2 seconds (ref 11.6–15.2)

## 2015-10-21 LAB — ABO/RH: ABO/RH(D): A POS

## 2015-10-21 NOTE — Progress Notes (Signed)
PATIENT AND HER SON BOTH STATED THAT DR. DICKSON WANTED PATIENT TO CONTINUE ASPIRIN.

## 2015-10-21 NOTE — Progress Notes (Signed)
[facility] of [place]  Patient name: [name] MRN: [id number] DOB: [birth year] Sex: female  REASON FOR VISIT: To discuss possible right lower extremity revascularization.  HPI: [name] is a 81 y.o. female, who I have followed with hemodialysis issues. She currently has a right IJ tunneled dialysis catheter. She is not a candidate for access in her left arm. She previously had symptoms of steal in her right upper extremity despite a normal arterial Doppler study on the right. I was considering her for right arm access. However, she developed a nonhealing wound on her right great toe. She was therefore set up for an arteriogram. 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  He was evaluated by cardiology. Her primary cardiologist is Dr. Geraldo Pitter. She was seen on 10/14/2015. Her echocardiogram was reviewed and she had a normal EF. Her case was discussed with Dr. Mare Ferrari. Given the limb threatening situation it was felt that no further cardiac workup was necessary at this time. She was felt to be at higher risk for surgery given her comorbidities however the wrist was acceptable given the potential benefit.  She normally dialyzes on Tuesdays Thursdays and Saturdays.  Past Medical History  Diagnosis Date  . Hypertension   . Heart failure [facility]) December 07, 2012    Right side  . Hypercalcemia     2nd to hyperparathyroidism  . Thyroid disease     Hyperparathyroidism  . Thyroid disease     Hypothyroidism  . Dyslipidemia   . CHF (congestive heart failure) ([place])   . Myocardial infarction ([place]) 10/28/2010  . Peripheral vascular disease ([place])   . Short-term memory loss   . Stroke ([place]) 2010ish    no residual  . Shingles 2010 ish  . Anginal pain ([place])   . Coronary artery disease   . Diabetes mellitus without complication ([place])     TYPE 2  . Shortness of breath   .  Parathyroid cyst ([place])   . Headache(784.0)     CALCIFICATION LT SKULL  . Arthritis     Gout  . Cancer ([place])     kidney left removed  . Sleep apnea     Recently test in [place](NEG) NO CPAP   . ESRD (end stage renal disease) ([place])     dialysis T-TH-SAT  . Hypothyroidism     Family History  Problem Relation Age of Onset  . Diabetes Sister   . Heart disease Sister     before age 65  . Hyperlipidemia Sister   . Hypertension Sister   . Diabetes Sister   . Hyperlipidemia Sister   . Hypertension Sister     SOCIAL HISTORY: Social History   Social History  . Marital Status: Widowed    Spouse Name: N/A  . Number of Children: N/A  . Years of Education: N/A   Occupational History  . Not on file.   Social History Main Topics  . Smoking status: Former Smoker -- 46 years    Types: Cigarettes    Quit date: 01/08/1998  . Smokeless tobacco: Never Used  . Alcohol Use: No  . Drug Use: No  . Sexual Activity: Not on file   Other Topics Concern  . Not on file   Social History Narrative    Allergies  Allergen Reactions  . Macrobid [Nitrofurantoin Macrocrystal] Anaphylaxis and Rash  . Ace Inhibitors Other (See Comments)    hyperkalemia  .  Angiotensin Receptor Blockers Other (See Comments)    hyperkalemia  . Influenza Vaccines Itching, Rash and Other (See Comments)    blisters    Current Outpatient Prescriptions  Medication Sig Dispense Refill  . acetaminophen (TYLENOL) 500 MG tablet Take 1,000 mg by mouth 3 (three) times daily as needed for mild pain.     Marland Kitchen albuterol (PROVENTIL HFA;VENTOLIN HFA) 108 (90 BASE) MCG/ACT inhaler Inhale 2 puffs into the lungs every 6 (six) hours as needed for wheezing or shortness of breath.    Marland Kitchen albuterol (PROVENTIL) (2.5 MG/3ML) 0.083% nebulizer solution Take 2.5 mg by nebulization every 6 (six) hours as needed for wheezing or shortness of breath.     Marland Kitchen aspirin 325 MG tablet Take 325 mg by mouth daily.    . calcitRIOL (ROCALTROL) 0.25 MCG  capsule Take 0.25 mcg by mouth 3 (three) times a week. Tuesday, Thursday, and Saturday.    . calcium acetate (PHOSLO) 667 MG capsule Take 1,334 mg by mouth 3 (three) times daily with meals.     . cephALEXin (KEFLEX) 250 MG capsule Take 1 capsule (250 mg total) by mouth 3 (three) times daily. 42 capsule 1  . cinacalcet (SENSIPAR) 30 MG tablet Take 30 mg by mouth daily with supper.    . fenofibrate 54 MG tablet Take 54 mg by mouth daily.     . folic acid-vitamin b complex-vitamin c-selenium-zinc (DIALYVITE) 3 MG TABS tablet Take 1 tablet by mouth daily.    Marland Kitchen FOSRENOL 1000 MG chewable tablet Chew 1,000 mg by mouth 3 (three) times daily before meals.    . gabapentin (NEURONTIN) 400 MG capsule Take 400 mg by mouth 3 (three) times daily.    . insulin aspart (NOVOLOG) 100 UNIT/ML injection Inject 0-22 Units into the skin 3 (three) times daily with meals. CBG: 0-70= 0 units 71-100=9 units after meal 101-150=14 before meal 151-200=16 units before meal 201-250=17 units before meal 251-300=18 units before meal 301-350=19 units before meal 351-400=20 units before meal Over 400=22 units before meal    . levothyroxine (SYNTHROID, LEVOTHROID) 88 MCG tablet Take 88 mcg by mouth daily before breakfast.    . nitroGLYCERIN (NITROSTAT) 0.4 MG SL tablet Place 0.4 mg under the tongue every 5 (five) minutes as needed for chest pain.    . Omega-3 Fatty Acids (FISH OIL) 1000 MG CAPS Take 1,000 mg by mouth 2 (two) times daily.     No current facility-administered medications for this visit.    REVIEW OF SYSTEMS:  [X]  denotes positive finding, [ ]  denotes negative finding Cardiac  Comments:  Chest pain or chest pressure:    Shortness of breath upon exertion:    Short of breath when lying flat:    Irregular heart rhythm:        Vascular    Pain in calf, thigh, or hip brought on by ambulation:    Pain in feet at night that wakes you up from your sleep:     Blood clot in your veins:    Leg swelling:           Pulmonary    Oxygen at home:    Productive cough:     Wheezing:         Neurologic    Sudden weakness in arms or legs:     Sudden numbness in arms or legs:     Sudden onset of difficulty speaking or slurred speech:    Temporary loss of vision in one eye:  Problems with dizziness:         Gastrointestinal    Blood in stool:     Vomited blood:         Genitourinary    Burning when urinating:     Blood in urine:        Psychiatric    Major depression:         Hematologic    Bleeding problems:    Problems with blood clotting too easily:        Skin    Rashes or ulcers:        Constitutional    Fever or chills:      PHYSICAL EXAM: Filed Vitals:   10/21/15 1219  BP: 140/71  Pulse: 79  Temp: 97.9 F (36.6 C)  TempSrc: Oral  Resp: 18  Height: 5' (1.524 m)  Weight: 196 lb 8 oz (89.132 kg)  SpO2: 98%    GENERAL: The patient is a well-nourished female, in no acute distress. The vital signs are documented above. CARDIAC: There is a regular rate and rhythm.  VASCULAR: she has palpable femoral pulses. PULMONARY: There is good air exchange bilaterally without wheezing or rales. ABDOMEN: Soft and non-tender with normal pitched bowel sounds.  MUSCULOSKELETAL: There are no major deformities or cyanosis. NEUROLOGIC: No focal weakness or paresthesias are detected. SKIN: she has dry gangrene of the right great toe with some mild cellulitis of the forefoot. PSYCHIATRIC: The patient has a normal affect.  DATA:   ARTERIOGRAM: I have reviewed the images of her arteriogram. On the right side, which is the symptom attic site, she has a eccentric plaque in the common femoral artery. She has moderate to severe disease of the superficial femoral artery with occlusion of the popliteal artery above the knee. There is reconstitution of the below-knee popliteal artery with two-vessel runoff via the anterior tibial and peroneal arteries.  There was no proximal left subclavian artery  stenosis noted.  VEIN MAP: I have independently interpreted her bilateral lower extremity vein map. The vein in the right thigh there is down to 0.22 cm in the distal thigh. The vein otherwise appears reasonable in size. Likewise the vein on the left looks reasonable.  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 I think without revascularization the wound will not heal nor would she heal a toe amputation. Her only option for revascularization is a right femoral to below-knee popliteal artery bypass. Her vein narrows down in some areas but hopefully she can have bypass with autogenous graft. I have reviewed the indications for lower extremity bypass. I have also reviewed the potential complications of surgery including but not limited to: wound healing problems, infection, graft thrombosis, limb loss, or other unpredictable medical problems. All the patient's questions were answered and they are agreeable to proceed. Certainly her risk is increased because of her age, obesity, end-stage renal disease, and other multiple comorbidities. However without revascularization she will require primary amputation which we have discussed. Her surgery is scheduled for a 10/27/15.   [name] [facility] [place]: [phone]

## 2015-10-21 NOTE — Progress Notes (Signed)
   10/21/15 1344  OBSTRUCTIVE SLEEP APNEA  Have you ever been diagnosed with sleep apnea through a sleep study? Yes (no CPAP )  If yes, do you have and use a CPAP or BPAP machine every night? 0  Do you snore loudly (loud enough to be heard through closed doors)?  1  Do you often feel tired, fatigued, or sleepy during the daytime (such as falling asleep during driving or talking to someone)? 0  Has anyone observed you stop breathing during your sleep? 0  Do you have, or are you being treated for high blood pressure? 1  BMI more than 35 kg/m2? 1  Age > 50 (1-yes) 1  Neck circumference greater than:Female 16 inches or larger, Female 17inches or larger? 1  Female Gender (Yes=1) 0  Obstructive Sleep Apnea Score 5  Score 5 or greater  Results sent to PCP

## 2015-10-22 LAB — HEMOGLOBIN A1C
Hgb A1c MFr Bld: 7.9 % — ABNORMAL HIGH (ref 4.8–5.6)
MEAN PLASMA GLUCOSE: 180 mg/dL

## 2015-10-26 MED ORDER — DEXTROSE 5 % IV SOLN
1.5000 g | INTRAVENOUS | Status: AC
  Administered 2015-10-27: 1.5 g via INTRAVENOUS
  Filled 2015-10-26: qty 1.5

## 2015-10-26 MED ORDER — SODIUM CHLORIDE 0.9 % IV SOLN
INTRAVENOUS | Status: DC
  Administered 2015-10-27 (×4): via INTRAVENOUS

## 2015-10-27 ENCOUNTER — Inpatient Hospital Stay (HOSPITAL_COMMUNITY): Payer: Medicare Other

## 2015-10-27 ENCOUNTER — Inpatient Hospital Stay (HOSPITAL_COMMUNITY): Payer: Medicare Other | Admitting: Certified Registered"

## 2015-10-27 ENCOUNTER — Encounter (HOSPITAL_COMMUNITY): Payer: Self-pay | Admitting: *Deleted

## 2015-10-27 ENCOUNTER — Inpatient Hospital Stay (HOSPITAL_COMMUNITY)
Admission: AD | Admit: 2015-10-27 | Discharge: 2015-11-03 | DRG: 252 | Disposition: A | Discharge status: Home / Self Care | Payer: Medicare Other | Source: Ambulatory Visit | Attending: Vascular Surgery | Admitting: Vascular Surgery

## 2015-10-27 ENCOUNTER — Encounter (HOSPITAL_COMMUNITY)
Admission: AD | Disposition: A | Discharge status: Home / Self Care | Payer: Self-pay | Source: Ambulatory Visit | Attending: Vascular Surgery

## 2015-10-27 DIAGNOSIS — Z79899 Other long term (current) drug therapy: Secondary | ICD-10-CM

## 2015-10-27 DIAGNOSIS — I739 Peripheral vascular disease, unspecified: Secondary | ICD-10-CM | POA: Diagnosis present

## 2015-10-27 DIAGNOSIS — Z8673 Personal history of transient ischemic attack (TIA), and cerebral infarction without residual deficits: Secondary | ICD-10-CM | POA: Diagnosis not present

## 2015-10-27 DIAGNOSIS — E1122 Type 2 diabetes mellitus with diabetic chronic kidney disease: Secondary | ICD-10-CM | POA: Diagnosis present

## 2015-10-27 DIAGNOSIS — I998 Other disorder of circulatory system: Secondary | ICD-10-CM | POA: Diagnosis present

## 2015-10-27 DIAGNOSIS — Z955 Presence of coronary angioplasty implant and graft: Secondary | ICD-10-CM

## 2015-10-27 DIAGNOSIS — Z992 Dependence on renal dialysis: Secondary | ICD-10-CM | POA: Diagnosis not present

## 2015-10-27 DIAGNOSIS — L03115 Cellulitis of right lower limb: Secondary | ICD-10-CM | POA: Diagnosis present

## 2015-10-27 DIAGNOSIS — E669 Obesity, unspecified: Secondary | ICD-10-CM | POA: Diagnosis present

## 2015-10-27 DIAGNOSIS — Z833 Family history of diabetes mellitus: Secondary | ICD-10-CM | POA: Diagnosis not present

## 2015-10-27 DIAGNOSIS — I120 Hypertensive chronic kidney disease with stage 5 chronic kidney disease or end stage renal disease: Secondary | ICD-10-CM | POA: Diagnosis present

## 2015-10-27 DIAGNOSIS — I743 Embolism and thrombosis of arteries of the lower extremities: Secondary | ICD-10-CM | POA: Diagnosis present

## 2015-10-27 DIAGNOSIS — M899 Disorder of bone, unspecified: Secondary | ICD-10-CM | POA: Diagnosis present

## 2015-10-27 DIAGNOSIS — I2510 Atherosclerotic heart disease of native coronary artery without angina pectoris: Secondary | ICD-10-CM | POA: Diagnosis present

## 2015-10-27 DIAGNOSIS — Z8249 Family history of ischemic heart disease and other diseases of the circulatory system: Secondary | ICD-10-CM

## 2015-10-27 DIAGNOSIS — E1152 Type 2 diabetes mellitus with diabetic peripheral angiopathy with gangrene: Secondary | ICD-10-CM | POA: Diagnosis present

## 2015-10-27 DIAGNOSIS — L97519 Non-pressure chronic ulcer of other part of right foot with unspecified severity: Secondary | ICD-10-CM | POA: Diagnosis present

## 2015-10-27 DIAGNOSIS — Z794 Long term (current) use of insulin: Secondary | ICD-10-CM | POA: Diagnosis not present

## 2015-10-27 DIAGNOSIS — Z9889 Other specified postprocedural states: Secondary | ICD-10-CM

## 2015-10-27 DIAGNOSIS — N186 End stage renal disease: Secondary | ICD-10-CM | POA: Diagnosis present

## 2015-10-27 DIAGNOSIS — Z888 Allergy status to other drugs, medicaments and biological substances status: Secondary | ICD-10-CM | POA: Diagnosis not present

## 2015-10-27 DIAGNOSIS — E785 Hyperlipidemia, unspecified: Secondary | ICD-10-CM | POA: Diagnosis present

## 2015-10-27 DIAGNOSIS — Z887 Allergy status to serum and vaccine status: Secondary | ICD-10-CM

## 2015-10-27 DIAGNOSIS — N2581 Secondary hyperparathyroidism of renal origin: Secondary | ICD-10-CM | POA: Diagnosis present

## 2015-10-27 DIAGNOSIS — I748 Embolism and thrombosis of other arteries: Secondary | ICD-10-CM | POA: Diagnosis present

## 2015-10-27 DIAGNOSIS — Z7982 Long term (current) use of aspirin: Secondary | ICD-10-CM

## 2015-10-27 DIAGNOSIS — M1990 Unspecified osteoarthritis, unspecified site: Secondary | ICD-10-CM | POA: Diagnosis present

## 2015-10-27 DIAGNOSIS — I70261 Atherosclerosis of native arteries of extremities with gangrene, right leg: Secondary | ICD-10-CM | POA: Diagnosis not present

## 2015-10-27 DIAGNOSIS — Z6838 Body mass index (BMI) 38.0-38.9, adult: Secondary | ICD-10-CM | POA: Diagnosis not present

## 2015-10-27 DIAGNOSIS — Z85528 Personal history of other malignant neoplasm of kidney: Secondary | ICD-10-CM | POA: Diagnosis not present

## 2015-10-27 DIAGNOSIS — E039 Hypothyroidism, unspecified: Secondary | ICD-10-CM | POA: Diagnosis present

## 2015-10-27 DIAGNOSIS — Z905 Acquired absence of kidney: Secondary | ICD-10-CM | POA: Diagnosis not present

## 2015-10-27 DIAGNOSIS — D649 Anemia, unspecified: Secondary | ICD-10-CM | POA: Diagnosis present

## 2015-10-27 DIAGNOSIS — G4730 Sleep apnea, unspecified: Secondary | ICD-10-CM | POA: Diagnosis present

## 2015-10-27 DIAGNOSIS — I252 Old myocardial infarction: Secondary | ICD-10-CM

## 2015-10-27 DIAGNOSIS — Z419 Encounter for procedure for purposes other than remedying health state, unspecified: Secondary | ICD-10-CM

## 2015-10-27 DIAGNOSIS — Z87891 Personal history of nicotine dependence: Secondary | ICD-10-CM

## 2015-10-27 HISTORY — PX: VEIN HARVEST: SHX6363

## 2015-10-27 HISTORY — PX: FEMORAL-POPLITEAL BYPASS GRAFT: SHX937

## 2015-10-27 HISTORY — PX: PATCH ANGIOPLASTY: SHX6230

## 2015-10-27 HISTORY — PX: ENDARTERECTOMY FEMORAL: SHX5804

## 2015-10-27 HISTORY — PX: INTRAOPERATIVE ARTERIOGRAM: SHX5157

## 2015-10-27 LAB — CBC
HEMATOCRIT: 37 % (ref 36.0–46.0)
HEMOGLOBIN: 11.4 g/dL — AB (ref 12.0–15.0)
MCH: 28.7 pg (ref 26.0–34.0)
MCHC: 30.8 g/dL (ref 30.0–36.0)
MCV: 93.2 fL (ref 78.0–100.0)
Platelets: 254 10*3/uL (ref 150–400)
RBC: 3.97 MIL/uL (ref 3.87–5.11)
RDW: 14.3 % (ref 11.5–15.5)
WBC: 14.3 10*3/uL — AB (ref 4.0–10.5)

## 2015-10-27 LAB — GLUCOSE, CAPILLARY
GLUCOSE-CAPILLARY: 177 mg/dL — AB (ref 65–99)
GLUCOSE-CAPILLARY: 135 mg/dL — AB (ref 65–99)
Glucose-Capillary: 105 mg/dL — ABNORMAL HIGH (ref 65–99)
Glucose-Capillary: 156 mg/dL — ABNORMAL HIGH (ref 65–99)

## 2015-10-27 LAB — POCT I-STAT 4, (NA,K, GLUC, HGB,HCT)
Glucose, Bld: 189 mg/dL — ABNORMAL HIGH (ref 65–99)
HCT: 40 % (ref 36.0–46.0)
Hemoglobin: 13.6 g/dL (ref 12.0–15.0)
Potassium: 4.4 mmol/L (ref 3.5–5.1)
Sodium: 139 mmol/L (ref 135–145)

## 2015-10-27 LAB — BASIC METABOLIC PANEL
ANION GAP: 13 (ref 5–15)
BUN: 48 mg/dL — AB (ref 6–20)
CHLORIDE: 106 mmol/L (ref 101–111)
CO2: 20 mmol/L — ABNORMAL LOW (ref 22–32)
Calcium: 8.3 mg/dL — ABNORMAL LOW (ref 8.9–10.3)
Creatinine, Ser: 5.65 mg/dL — ABNORMAL HIGH (ref 0.44–1.00)
GFR calc Af Amer: 8 mL/min — ABNORMAL LOW (ref >60)
GFR calc non Af Amer: 6 mL/min — ABNORMAL LOW (ref >60)
GLUCOSE: 170 mg/dL — AB (ref 65–99)
POTASSIUM: 5.1 mmol/L (ref 3.5–5.1)
Sodium: 139 mmol/L (ref 135–145)

## 2015-10-27 SURGERY — BYPASS GRAFT FEMORAL-POPLITEAL ARTERY
Anesthesia: General | Site: Leg Lower | Laterality: Right

## 2015-10-27 MED ORDER — POTASSIUM CHLORIDE CRYS ER 20 MEQ PO TBCR: 20.0000 meq | EXTENDED_RELEASE_TABLET | Freq: Every day | ORAL | Status: DC | PRN

## 2015-10-27 MED ORDER — LEVOTHYROXINE SODIUM 88 MCG PO TABS
88.0000 ug | ORAL_TABLET | Freq: Every day | ORAL | Status: DC
  Administered 2015-10-29 – 2015-11-03 (×6): 88 ug via ORAL
  Filled 2015-10-27 (×6): qty 1

## 2015-10-27 MED ORDER — HYDROMORPHONE HCL 1 MG/ML IJ SOLN
INTRAMUSCULAR | Status: AC
  Filled 2015-10-27: qty 1

## 2015-10-27 MED ORDER — INSULIN ASPART 100 UNIT/ML ~~LOC~~ SOLN
SUBCUTANEOUS | Status: DC | PRN
  Administered 2015-10-27: 5 [IU] via SUBCUTANEOUS

## 2015-10-27 MED ORDER — CHLORHEXIDINE GLUCONATE CLOTH 2 % EX PADS: 6.0000 | MEDICATED_PAD | Freq: Once | CUTANEOUS | Status: DC

## 2015-10-27 MED ORDER — DOCUSATE SODIUM 100 MG PO CAPS
100.0000 mg | ORAL_CAPSULE | Freq: Every day | ORAL | Status: DC
  Administered 2015-10-29 – 2015-11-03 (×6): 100 mg via ORAL
  Filled 2015-10-27 (×6): qty 1

## 2015-10-27 MED ORDER — GUAIFENESIN-DM 100-10 MG/5ML PO SYRP
15.0000 mL | ORAL_SOLUTION | ORAL | Status: DC | PRN
  Filled 2015-10-27: qty 15

## 2015-10-27 MED ORDER — PANTOPRAZOLE SODIUM 40 MG PO TBEC
40.0000 mg | DELAYED_RELEASE_TABLET | Freq: Every day | ORAL | Status: DC
  Administered 2015-10-27 – 2015-11-03 (×7): 40 mg via ORAL
  Filled 2015-10-27 (×7): qty 1

## 2015-10-27 MED ORDER — EPHEDRINE SULFATE 50 MG/ML IJ SOLN
INTRAMUSCULAR | Status: AC
  Filled 2015-10-27: qty 1

## 2015-10-27 MED ORDER — ASPIRIN 325 MG PO TABS
325.0000 mg | ORAL_TABLET | Freq: Every day | ORAL | Status: DC
  Administered 2015-10-27 – 2015-11-03 (×7): 325 mg via ORAL
  Filled 2015-10-27 (×7): qty 1

## 2015-10-27 MED ORDER — ROCURONIUM BROMIDE 50 MG/5ML IV SOLN
INTRAVENOUS | Status: AC
  Filled 2015-10-27: qty 1

## 2015-10-27 MED ORDER — ONDANSETRON HCL 4 MG/2ML IJ SOLN
INTRAMUSCULAR | Status: AC
  Filled 2015-10-27: qty 2

## 2015-10-27 MED ORDER — NEOSTIGMINE METHYLSULFATE 10 MG/10ML IV SOLN
INTRAVENOUS | Status: DC | PRN
  Administered 2015-10-27: 3 mg via INTRAVENOUS

## 2015-10-27 MED ORDER — ENOXAPARIN SODIUM 30 MG/0.3ML ~~LOC~~ SOLN
30.0000 mg | SUBCUTANEOUS | Status: DC
  Administered 2015-10-29 – 2015-11-03 (×6): 30 mg via SUBCUTANEOUS
  Filled 2015-10-27 (×6): qty 0.3

## 2015-10-27 MED ORDER — CALCITRIOL 0.5 MCG PO CAPS: 0.7500 ug | ORAL_CAPSULE | ORAL | Status: DC

## 2015-10-27 MED ORDER — STERILE WATER FOR INJECTION IJ SOLN
INTRAMUSCULAR | Status: AC
  Filled 2015-10-27: qty 10

## 2015-10-27 MED ORDER — PROPOFOL 10 MG/ML IV BOLUS
INTRAVENOUS | Status: DC | PRN
  Administered 2015-10-27: 90 mg via INTRAVENOUS

## 2015-10-27 MED ORDER — PAPAVERINE HCL 30 MG/ML IJ SOLN
INTRAMUSCULAR | Status: AC
  Filled 2015-10-27: qty 2

## 2015-10-27 MED ORDER — IOHEXOL 300 MG/ML  SOLN
INTRAMUSCULAR | Status: DC | PRN
  Administered 2015-10-27: 50 mL via INTRAVENOUS

## 2015-10-27 MED ORDER — GABAPENTIN 400 MG PO CAPS
400.0000 mg | ORAL_CAPSULE | Freq: Three times a day (TID) | ORAL | Status: DC
  Administered 2015-10-28 – 2015-11-03 (×18): 400 mg via ORAL
  Filled 2015-10-27 (×18): qty 1

## 2015-10-27 MED ORDER — SODIUM CHLORIDE 0.9 % IV SOLN
INTRAVENOUS | Status: DC | PRN
  Administered 2015-10-27: 09:00:00

## 2015-10-27 MED ORDER — MORPHINE SULFATE (PF) 2 MG/ML IV SOLN
2.0000 mg | INTRAVENOUS | Status: DC | PRN
Start: 2015-10-27 — End: 2015-11-03
  Administered 2015-10-27 – 2015-10-31 (×3): 2 mg via INTRAVENOUS
  Filled 2015-10-27 (×3): qty 1

## 2015-10-27 MED ORDER — LIDOCAINE HCL (CARDIAC) 20 MG/ML IV SOLN
INTRAVENOUS | Status: DC | PRN
  Administered 2015-10-27: 70 mg via INTRAVENOUS

## 2015-10-27 MED ORDER — FENTANYL CITRATE (PF) 250 MCG/5ML IJ SOLN
INTRAMUSCULAR | Status: AC
  Filled 2015-10-27: qty 5

## 2015-10-27 MED ORDER — 0.9 % SODIUM CHLORIDE (POUR BTL) OPTIME
TOPICAL | Status: DC | PRN
  Administered 2015-10-27: 2000 mL

## 2015-10-27 MED ORDER — CALCIUM ACETATE (PHOS BINDER) 667 MG PO CAPS
1334.0000 mg | ORAL_CAPSULE | Freq: Three times a day (TID) | ORAL | Status: DC
  Administered 2015-10-27 – 2015-11-03 (×17): 1334 mg via ORAL
  Filled 2015-10-27 (×17): qty 2

## 2015-10-27 MED ORDER — SODIUM CHLORIDE 0.9 % IV SOLN
INTRAVENOUS | Status: DC
  Administered 2015-10-27: 14:00:00 via INTRAVENOUS

## 2015-10-27 MED ORDER — ONDANSETRON HCL 4 MG/2ML IJ SOLN
4.0000 mg | Freq: Four times a day (QID) | INTRAMUSCULAR | Status: DC | PRN
  Administered 2015-10-29 – 2015-11-02 (×5): 4 mg via INTRAVENOUS
  Filled 2015-10-27 (×6): qty 2

## 2015-10-27 MED ORDER — SODIUM CHLORIDE 0.9 % IV SOLN
500.0000 mL | Freq: Once | INTRAVENOUS | Status: AC | PRN
  Administered 2015-10-27: 500 mL via INTRAVENOUS

## 2015-10-27 MED ORDER — HYDRALAZINE HCL 20 MG/ML IJ SOLN: 5.0000 mg | INTRAMUSCULAR | Status: DC | PRN

## 2015-10-27 MED ORDER — HYDROMORPHONE HCL 1 MG/ML IJ SOLN
0.2500 mg | INTRAMUSCULAR | Status: DC | PRN
  Administered 2015-10-27: 0.25 mg via INTRAVENOUS
  Administered 2015-10-27: 0.5 mg via INTRAVENOUS
  Administered 2015-10-27: 0.25 mg via INTRAVENOUS

## 2015-10-27 MED ORDER — CINACALCET HCL 30 MG PO TABS
30.0000 mg | ORAL_TABLET | Freq: Every day | ORAL | Status: DC
  Administered 2015-10-27 – 2015-11-03 (×8): 30 mg via ORAL
  Filled 2015-10-27 (×8): qty 1

## 2015-10-27 MED ORDER — MAGNESIUM SULFATE 2 GM/50ML IV SOLN
2.0000 g | Freq: Every day | INTRAVENOUS | Status: DC | PRN
  Filled 2015-10-27: qty 50

## 2015-10-27 MED ORDER — PROPOFOL 10 MG/ML IV BOLUS
INTRAVENOUS | Status: AC
  Filled 2015-10-27: qty 20

## 2015-10-27 MED ORDER — PHENOL 1.4 % MT LIQD: 1.0000 | OROMUCOSAL | Status: DC | PRN

## 2015-10-27 MED ORDER — CEFUROXIME SODIUM 1.5 G IJ SOLR: 1.5000 g | Freq: Two times a day (BID) | INTRAMUSCULAR | Status: DC

## 2015-10-27 MED ORDER — BISACODYL 10 MG RE SUPP
10.0000 mg | Freq: Every day | RECTAL | Status: DC | PRN
  Administered 2015-11-01: 10 mg via RECTAL
  Filled 2015-10-27: qty 1

## 2015-10-27 MED ORDER — GLYCOPYRROLATE 0.2 MG/ML IJ SOLN
INTRAMUSCULAR | Status: AC
  Filled 2015-10-27: qty 1

## 2015-10-27 MED ORDER — INSULIN ASPART 100 UNIT/ML ~~LOC~~ SOLN
0.0000 [IU] | Freq: Three times a day (TID) | SUBCUTANEOUS | Status: DC
  Administered 2015-10-27: 16 [IU] via SUBCUTANEOUS
  Administered 2015-10-28: 17 [IU] via SUBCUTANEOUS
  Administered 2015-10-29: 16 [IU] via SUBCUTANEOUS
  Administered 2015-10-29: 14 [IU] via SUBCUTANEOUS
  Administered 2015-10-30: 17 [IU] via SUBCUTANEOUS
  Administered 2015-10-30: 16 [IU] via SUBCUTANEOUS
  Administered 2015-10-31: 17 [IU] via SUBCUTANEOUS
  Administered 2015-11-01 – 2015-11-02 (×4): 14 [IU] via SUBCUTANEOUS
  Administered 2015-11-02 – 2015-11-03 (×2): 16 [IU] via SUBCUTANEOUS
  Administered 2015-11-03: 17 [IU] via SUBCUTANEOUS

## 2015-10-27 MED ORDER — OXYCODONE-ACETAMINOPHEN 5-325 MG PO TABS
1.0000 | ORAL_TABLET | ORAL | Status: DC | PRN
  Administered 2015-10-27 – 2015-10-29 (×6): 2 via ORAL
  Administered 2015-11-01: 1 via ORAL
  Administered 2015-11-02 – 2015-11-03 (×2): 2 via ORAL
  Filled 2015-10-27: qty 1
  Filled 2015-10-27 (×4): qty 2
  Filled 2015-10-27: qty 1
  Filled 2015-10-27 (×4): qty 2

## 2015-10-27 MED ORDER — CEFUROXIME SODIUM 1.5 G IJ SOLR
1.5000 g | Freq: Once | INTRAMUSCULAR | Status: DC
  Filled 2015-10-27 (×2): qty 1.5

## 2015-10-27 MED ORDER — NITROGLYCERIN 0.4 MG SL SUBL: 0.4000 mg | SUBLINGUAL_TABLET | SUBLINGUAL | Status: DC | PRN

## 2015-10-27 MED ORDER — SODIUM CHLORIDE 0.9 % IV SOLN
62.5000 mg | INTRAVENOUS | Status: DC
  Administered 2015-10-29: 62.5 mg via INTRAVENOUS
  Filled 2015-10-27 (×3): qty 5

## 2015-10-27 MED ORDER — FENTANYL CITRATE (PF) 100 MCG/2ML IJ SOLN
INTRAMUSCULAR | Status: DC | PRN
  Administered 2015-10-27 (×3): 50 ug via INTRAVENOUS
  Administered 2015-10-27: 100 ug via INTRAVENOUS

## 2015-10-27 MED ORDER — ACETAMINOPHEN 500 MG PO TABS
1000.0000 mg | ORAL_TABLET | Freq: Three times a day (TID) | ORAL | Status: DC | PRN
  Administered 2015-10-30 – 2015-11-01 (×4): 1000 mg via ORAL
  Filled 2015-10-27 (×4): qty 2

## 2015-10-27 MED ORDER — SENNOSIDES-DOCUSATE SODIUM 8.6-50 MG PO TABS
1.0000 | ORAL_TABLET | Freq: Every evening | ORAL | Status: DC | PRN
  Administered 2015-10-28: 1 via ORAL
  Filled 2015-10-27: qty 1

## 2015-10-27 MED ORDER — MIDAZOLAM HCL 2 MG/2ML IJ SOLN
INTRAMUSCULAR | Status: AC
  Filled 2015-10-27: qty 2

## 2015-10-27 MED ORDER — HEPARIN SODIUM (PORCINE) 1000 UNIT/ML IJ SOLN
INTRAMUSCULAR | Status: DC | PRN
  Administered 2015-10-27: 8000 [IU] via INTRAVENOUS

## 2015-10-27 MED ORDER — PROTAMINE SULFATE 10 MG/ML IV SOLN
INTRAVENOUS | Status: DC | PRN
  Administered 2015-10-27 (×4): 10 mg via INTRAVENOUS

## 2015-10-27 MED ORDER — ALBUTEROL SULFATE (2.5 MG/3ML) 0.083% IN NEBU: 2.5000 mg | INHALATION_SOLUTION | Freq: Four times a day (QID) | RESPIRATORY_TRACT | Status: DC | PRN

## 2015-10-27 MED ORDER — LIDOCAINE HCL (CARDIAC) 20 MG/ML IV SOLN
INTRAVENOUS | Status: AC
  Filled 2015-10-27: qty 5

## 2015-10-27 MED ORDER — HEPARIN SODIUM (PORCINE) 1000 UNIT/ML IJ SOLN
INTRAMUSCULAR | Status: AC
  Filled 2015-10-27: qty 1

## 2015-10-27 MED ORDER — HEMOSTATIC AGENTS (NO CHARGE) OPTIME
TOPICAL | Status: DC | PRN
  Administered 2015-10-27: 1

## 2015-10-27 MED ORDER — MIDAZOLAM HCL 5 MG/5ML IJ SOLN
INTRAMUSCULAR | Status: DC | PRN
  Administered 2015-10-27: 1 mg via INTRAVENOUS

## 2015-10-27 MED ORDER — PROTAMINE SULFATE 10 MG/ML IV SOLN
INTRAVENOUS | Status: AC
  Filled 2015-10-27: qty 5

## 2015-10-27 MED ORDER — ONDANSETRON HCL 4 MG/2ML IJ SOLN
INTRAMUSCULAR | Status: DC | PRN
  Administered 2015-10-27: 4 mg via INTRAVENOUS

## 2015-10-27 MED ORDER — METOPROLOL TARTRATE 1 MG/ML IV SOLN: 2.0000 mg | INTRAVENOUS | Status: DC | PRN

## 2015-10-27 MED ORDER — LABETALOL HCL 5 MG/ML IV SOLN: 10.0000 mg | INTRAVENOUS | Status: DC | PRN

## 2015-10-27 MED ORDER — ROCURONIUM BROMIDE 100 MG/10ML IV SOLN
INTRAVENOUS | Status: DC | PRN
  Administered 2015-10-27: 40 mg via INTRAVENOUS

## 2015-10-27 MED ORDER — PAPAVERINE HCL 30 MG/ML IJ SOLN
INTRAMUSCULAR | Status: DC | PRN
  Administered 2015-10-27: 60 mg

## 2015-10-27 MED ORDER — SUCCINYLCHOLINE CHLORIDE 20 MG/ML IJ SOLN
INTRAMUSCULAR | Status: AC
  Filled 2015-10-27: qty 1

## 2015-10-27 MED ORDER — DEXTROSE 5 % IV SOLN
10.0000 mg | INTRAVENOUS | Status: DC | PRN
  Administered 2015-10-27: 15 ug/min via INTRAVENOUS

## 2015-10-27 MED ORDER — NEOSTIGMINE METHYLSULFATE 10 MG/10ML IV SOLN
INTRAVENOUS | Status: AC
  Filled 2015-10-27: qty 1

## 2015-10-27 MED ORDER — FENOFIBRATE 54 MG PO TABS
54.0000 mg | ORAL_TABLET | Freq: Every day | ORAL | Status: DC
  Administered 2015-10-27 – 2015-11-03 (×7): 54 mg via ORAL
  Filled 2015-10-27 (×12): qty 1

## 2015-10-27 MED ORDER — PROMETHAZINE HCL 25 MG/ML IJ SOLN: 6.2500 mg | INTRAMUSCULAR | Status: DC | PRN

## 2015-10-27 MED ORDER — CALCITRIOL 0.25 MCG PO CAPS: 0.2500 ug | ORAL_CAPSULE | ORAL | Status: DC

## 2015-10-27 MED ORDER — ALUM & MAG HYDROXIDE-SIMETH 200-200-20 MG/5ML PO SUSP: 15.0000 mL | ORAL | Status: DC | PRN

## 2015-10-27 MED ORDER — ALBUTEROL SULFATE HFA 108 (90 BASE) MCG/ACT IN AERS
2.0000 | INHALATION_SPRAY | Freq: Four times a day (QID) | RESPIRATORY_TRACT | Status: DC | PRN
Start: 2015-10-27 — End: 2015-10-27

## 2015-10-27 MED ORDER — GLYCOPYRROLATE 0.2 MG/ML IJ SOLN
INTRAMUSCULAR | Status: DC | PRN
  Administered 2015-10-27: .4 mg via INTRAVENOUS

## 2015-10-27 SURGICAL SUPPLY — 73 items
AGENT HMST SPONGE THK3/8 (HEMOSTASIS) ×3
BANDAGE ELASTIC 4 VELCRO ST LF (GAUZE/BANDAGES/DRESSINGS) ×2 IMPLANT
BANDAGE ESMARK 6X9 LF (GAUZE/BANDAGES/DRESSINGS) IMPLANT
BNDG CMPR 9X6 STRL LF SNTH (GAUZE/BANDAGES/DRESSINGS) ×3
BNDG ESMARK 6X9 LF (GAUZE/BANDAGES/DRESSINGS) ×5
CANISTER SUCTION 2500CC (MISCELLANEOUS) ×5 IMPLANT
CANNULA VESSEL 3MM 2 BLNT TIP (CANNULA) ×12 IMPLANT
CLIP TI MEDIUM 24 (CLIP) ×5 IMPLANT
CLIP TI WIDE RED SMALL 24 (CLIP) ×7 IMPLANT
CUFF TOURNIQUET SINGLE 34IN LL (TOURNIQUET CUFF) ×2 IMPLANT
DRAIN CHANNEL 15F RND FF W/TCR (WOUND CARE) ×4 IMPLANT
DRAPE X-RAY CASS 24X20 (DRAPES) ×2 IMPLANT
ELECT CAUTERY BLADE 6.4 (BLADE) ×2 IMPLANT
ELECT REM PT RETURN 9FT ADLT (ELECTROSURGICAL) ×5
ELECTRODE REM PT RTRN 9FT ADLT (ELECTROSURGICAL) ×3 IMPLANT
EVACUATOR SILICONE 100CC (DRAIN) ×4 IMPLANT
GAUZE SPONGE 4X4 16PLY XRAY LF (GAUZE/BANDAGES/DRESSINGS) ×2 IMPLANT
GLOVE BIO SURGEON STRL SZ 6.5 (GLOVE) ×2 IMPLANT
GLOVE BIO SURGEON STRL SZ7.5 (GLOVE) ×5 IMPLANT
GLOVE BIO SURGEONS STRL SZ 6.5 (GLOVE) ×2
GLOVE BIOGEL PI IND STRL 6.5 (GLOVE) IMPLANT
GLOVE BIOGEL PI IND STRL 7.0 (GLOVE) IMPLANT
GLOVE BIOGEL PI IND STRL 7.5 (GLOVE) IMPLANT
GLOVE BIOGEL PI IND STRL 8 (GLOVE) ×3 IMPLANT
GLOVE BIOGEL PI INDICATOR 6.5 (GLOVE) ×4
GLOVE BIOGEL PI INDICATOR 7.0 (GLOVE) ×6
GLOVE BIOGEL PI INDICATOR 7.5 (GLOVE) ×2
GLOVE BIOGEL PI INDICATOR 8 (GLOVE) ×2
GLOVE ECLIPSE 7.0 STRL STRAW (GLOVE) ×2 IMPLANT
GLOVE SURG SS PI 7.0 STRL IVOR (GLOVE) ×2 IMPLANT
GOWN STRL REUS W/ TWL LRG LVL3 (GOWN DISPOSABLE) ×9 IMPLANT
GOWN STRL REUS W/ TWL XL LVL3 (GOWN DISPOSABLE) IMPLANT
GOWN STRL REUS W/TWL LRG LVL3 (GOWN DISPOSABLE) ×20
GOWN STRL REUS W/TWL XL LVL3 (GOWN DISPOSABLE) ×10
GRAFT PROPATEN THIN WALL 6X80 (Vascular Products) ×2 IMPLANT
HEMOSTAT SPONGE AVITENE ULTRA (HEMOSTASIS) ×2 IMPLANT
INSERT FOGARTY SM (MISCELLANEOUS) ×2 IMPLANT
KIT BASIN OR (CUSTOM PROCEDURE TRAY) ×5 IMPLANT
KIT ROOM TURNOVER OR (KITS) ×5 IMPLANT
LIQUID BAND (GAUZE/BANDAGES/DRESSINGS) ×6 IMPLANT
MARKER GRAFT CORONARY BYPASS (MISCELLANEOUS) IMPLANT
NDL 18GX1X1/2 (RX/OR ONLY) (NEEDLE) IMPLANT
NEEDLE 18GX1X1/2 (RX/OR ONLY) (NEEDLE) ×5 IMPLANT
NS IRRIG 1000ML POUR BTL (IV SOLUTION) ×10 IMPLANT
PACK PERIPHERAL VASCULAR (CUSTOM PROCEDURE TRAY) ×5 IMPLANT
PAD ARMBOARD 7.5X6 YLW CONV (MISCELLANEOUS) ×10 IMPLANT
PENCIL BUTTON HOLSTER BLD 10FT (ELECTRODE) ×2 IMPLANT
SET COLLECT BLD 21X3/4 12 (NEEDLE) ×2 IMPLANT
SPONGE SURGIFOAM ABS GEL 100 (HEMOSTASIS) IMPLANT
STAPLER VISISTAT (STAPLE) IMPLANT
STOPCOCK 4 WAY LG BORE MALE ST (IV SETS) ×2 IMPLANT
SUT ETHILON 3 0 PS 1 (SUTURE) ×4 IMPLANT
SUT PROLENE 5 0 C 1 24 (SUTURE) ×7 IMPLANT
SUT PROLENE 6 0 BV (SUTURE) ×11 IMPLANT
SUT PROLENE 7 0 BV 1 (SUTURE) IMPLANT
SUT SILK 2 0 (SUTURE) ×5
SUT SILK 2 0 FS (SUTURE) ×7 IMPLANT
SUT SILK 2 0 SH (SUTURE) ×3 IMPLANT
SUT SILK 2-0 18XBRD TIE 12 (SUTURE) IMPLANT
SUT SILK 3 0 (SUTURE) ×5
SUT SILK 3-0 18XBRD TIE 12 (SUTURE) IMPLANT
SUT VIC AB 2-0 CT1 27 (SUTURE) ×5
SUT VIC AB 2-0 CT1 36 (SUTURE) ×2 IMPLANT
SUT VIC AB 2-0 CT1 TAPERPNT 27 (SUTURE) IMPLANT
SUT VIC AB 2-0 CTB1 (SUTURE) ×12 IMPLANT
SUT VIC AB 3-0 SH 27 (SUTURE) ×15
SUT VIC AB 3-0 SH 27X BRD (SUTURE) ×6 IMPLANT
SUT VICRYL 4-0 PS2 18IN ABS (SUTURE) ×14 IMPLANT
SYR 20CC LL (SYRINGE) ×2 IMPLANT
SYR 3ML LL SCALE MARK (SYRINGE) ×2 IMPLANT
TUBING EXTENTION W/L.L. (IV SETS) ×2 IMPLANT
UNDERPAD 30X30 INCONTINENT (UNDERPADS AND DIAPERS) ×5 IMPLANT
WATER STERILE IRR 1000ML POUR (IV SOLUTION) ×5 IMPLANT

## 2015-10-27 NOTE — Anesthesia Preprocedure Evaluation (Addendum)
Anesthesia Evaluation  Patient identified by MRN, date of birth, ID band Patient awake    Reviewed: Allergy & Precautions, NPO status , Patient's Chart, lab work & pertinent test results  Airway Mallampati: II   Neck ROM: Limited  Mouth opening: Limited Mouth Opening  Dental  (+) Edentulous Upper, Dental Advisory Given   Pulmonary shortness of breath, sleep apnea , former smoker,    breath sounds clear to auscultation       Cardiovascular hypertension, + angina + CAD, + Past MI and +CHF   Rhythm:Regular Rate:Normal     Neuro/Psych CVA, No Residual Symptoms    GI/Hepatic   Endo/Other  diabetes, Poorly Controlled, Type 2Hypothyroidism   Renal/GU Renal disease     Musculoskeletal  (+) Arthritis ,   Abdominal (+) + obese,   Peds  Hematology   Anesthesia Other Findings   Reproductive/Obstetrics                          Anesthesia Physical Anesthesia Plan  ASA: III  Anesthesia Plan: General   Post-op Pain Management:    Induction: Intravenous  Airway Management Planned: Oral ETT  Additional Equipment:   Intra-op Plan:   Post-operative Plan: Extubation in OR and Possible Post-op intubation/ventilation  Informed Consent: I have reviewed the patients History and Physical, chart, labs and discussed the procedure including the risks, benefits and alternatives for the proposed anesthesia with the patient or authorized representative who has indicated his/her understanding and acceptance.     Plan Discussed with:   Anesthesia Plan Comments:         Anesthesia Quick Evaluation

## 2015-10-27 NOTE — Interval H&P Note (Signed)
History and Physical Interval Note:  10/27/2015 7:21 AM  [name]  has presented today for surgery, with the diagnosis of Nonhealing wound right great toe L97.509  The various methods of treatment have been discussed with the patient and family. After consideration of risks, benefits and other options for treatment, the patient has consented to  Procedure(s): BYPASS GRAFT FEMORAL-POPLITEAL ARTERY (Right) as a surgical intervention .  The patient's history has been reviewed, patient examined, no change in status, stable for surgery.  I have reviewed the patient's chart and labs.  Questions were answered to the patient's satisfaction.     [name]

## 2015-10-27 NOTE — Consult Note (Signed)
[facility] Renal Consultation Note    Indication for Consultation:  Management of ESRD/hemodialysis; anemia, hypertension/volume and secondary hyperparathyroidism  HPI: [name] is a 81 y.o. female with ESRD, DM, HTN, PVD, CAD,multiple vascular access problems, hx solitary kidney due to prior nephrectomy for cancer on TTS dialysis in [place] is admitted following RCF endarterectomy and  RLE bypass for critical limb ischemia. She has been dialyzing uneventfully in [place] and attaining her EDW with stable BP, modest volumes and stable Hgb.  Pt seen in PACU- c/o pain of burning pain in right leg. No SOB, CP, N, V.  Past Medical History  Diagnosis Date  . Hypertension   . Heart failure [facility]) December 07, 2012    Right side  . Hypercalcemia     2nd to hyperparathyroidism  . Thyroid disease     Hyperparathyroidism  . Thyroid disease     Hypothyroidism  . Dyslipidemia   . CHF (congestive heart failure) ([place])   . Myocardial infarction ([place]) 10/28/2010  . Peripheral vascular disease ([place])   . Short-term memory loss   . Stroke ([place]) 2010ish    no residual  . Shingles 2010 ish  . Anginal pain ([place])   . Coronary artery disease   . Diabetes mellitus without complication ([place])     TYPE 2  . Parathyroid cyst ([place])   . Headache(784.0)     CALCIFICATION LT SKULL  . Arthritis     Gout  . Cancer ([place])     kidney left removed  . Sleep apnea     Recently test in [place](NEG) NO CPAP   . ESRD (end stage renal disease) ([place])     dialysis T-TH-SAT  . Hypothyroidism   . Shortness of breath     with exertion    Past Surgical History  Procedure Laterality Date  . Nephrectomy Left 2005  . Abdominal hysterectomy  2005  . Cholecystectomy  2005  . [place] stent    . Coronary stent placement Left 10/2010  . Av fistula placement Left 01/09/2013    Procedure: ARTERIOVENOUS (AV) FISTULA CREATION;  Surgeon: Mal Misty, MD;  Location: [place];  Service: Vascular;   Laterality: Left;  Creation of Left Brachial Cephalic Arteriovenous Fistula  . Bascilic vein transposition Left 07/30/2013    Procedure: 1ST Stage BASCILIC VEIN TRANSPOSITION;  Surgeon: Angelia Mould, MD;  Location: [place];  Service: Vascular;  Laterality: Left;  . Cardiac catheterization      2013 STENT PLACED  . Cataract extraction      BILAT  . Bascilic vein transposition Left 12/17/2013    Procedure: BASILIC VEIN TRANSPOSITION - LEFT ARM 2ND STAGE;  Surgeon: Angelia Mould, MD;  Location: [place];  Service: Vascular;  Laterality: Left;  . Insertion of dialysis catheter Left 03/04/2014    Procedure: INSERTION OF DIALYSIS CATHETER;  Surgeon: Conrad Lockwood, MD;  Location: [place];  Service: Vascular;  Laterality: Left;  Marland Kitchen Eye surgery Bilateral     cataract surgery  . Appendectomy    . Bascilic vein transposition Right 04/18/2014    Procedure: BASILIC VEIN TRANSPOSITION;  Surgeon: Angelia Mould, MD;  Location: [place];  Service: Vascular;  Laterality: Right;  . Ligation of arteriovenous  fistula Right 04/19/2014    Procedure: LIGATION OF ARTERIOVENOUS  FISTULA;  Surgeon: Serafina Mitchell, MD;  Location: [place];  Service: Vascular;  Laterality: Right;  . Av fistula placement Left 05/23/2014    Procedure: INSERTION OF ARTERIOVENOUS (  AV) GORE-TEX GRAFT ARM;  Surgeon: Angelia Mould, MD;  Location: [place];  Service: Vascular;  Laterality: Left;  . Shuntogram Left 02/10/2014    Procedure: FISTULOGRAM;  Surgeon: Angelia Mould, MD;  Location: [facility] CATH LAB;  Service: Cardiovascular;  Laterality: Left;  . Peripheral vascular catheterization N/A 10/12/2015    Procedure: Abdominal Aortogram w/Lower Extremity;  Surgeon: Angelia Mould, MD;  Location: [place] CV LAB;  Service: Cardiovascular;  Laterality: N/A;  . Peripheral vascular catheterization N/A 10/12/2015    Procedure: Upper Extremity Angiography/Right;  Surgeon: Angelia Mould, MD;  Location: [place] CV LAB;   Service: Cardiovascular;  Laterality: N/A;   Family History  Problem Relation Age of Onset  . Diabetes Sister   . Heart disease Sister     before age 67  . Hyperlipidemia Sister   . Hypertension Sister   . Diabetes Sister   . Hyperlipidemia Sister   . Hypertension Sister    Social History:  reports that she quit smoking about 17 years ago. Her smoking use included Cigarettes. She quit after 46 years of use. She has never used smokeless tobacco. She reports that she does not drink alcohol or use illicit drugs. Allergies  Allergen Reactions  . Macrobid [Nitrofurantoin Macrocrystal] Anaphylaxis and Rash  . Ace Inhibitors Other (See Comments)    hyperkalemia  . Angiotensin Receptor Blockers Other (See Comments)    hyperkalemia  . Influenza Vaccines Itching, Rash and Other (See Comments)    blisters   Prior to Admission medications   Medication Sig Start Date End Date Taking? Authorizing Provider  acetaminophen (TYLENOL) 500 MG tablet Take 1,000 mg by mouth 3 (three) times daily as needed for mild pain.    Yes Historical Provider, MD  aspirin 325 MG tablet Take 325 mg by mouth daily.   Yes Historical Provider, MD  calcitRIOL (ROCALTROL) 0.25 MCG capsule Take 0.25 mcg by mouth 3 (three) times a week. Tuesday, Thursday, and Saturday.   Yes Historical Provider, MD  calcium acetate (PHOSLO) 667 MG capsule Take 1,334 mg by mouth 3 (three) times daily with meals.  09/28/15  Yes Historical Provider, MD  cephALEXin (KEFLEX) 250 MG capsule Take 1 capsule (250 mg total) by mouth 3 (three) times daily. 10/12/15  Yes Angelia Mould, MD  cinacalcet (SENSIPAR) 30 MG tablet Take 30 mg by mouth daily with supper.   Yes Historical Provider, MD  fenofibrate 54 MG tablet Take 54 mg by mouth daily.  05/06/14  Yes Historical Provider, MD  folic acid-vitamin b complex-vitamin c-selenium-zinc (DIALYVITE) 3 MG TABS tablet Take 1 tablet by mouth daily.   Yes Historical Provider, MD  FOSRENOL 1000 MG  chewable tablet Chew 1,000 mg by mouth 3 (three) times daily before meals. 09/17/15  Yes Historical Provider, MD  gabapentin (NEURONTIN) 400 MG capsule Take 400 mg by mouth 3 (three) times daily.   Yes Historical Provider, MD  insulin aspart (NOVOLOG) 100 UNIT/ML injection Inject 0-22 Units into the skin 3 (three) times daily with meals. CBG: 0-70= 0 units 71-100=9 units after meal 101-150=14 before meal 151-200=16 units before meal 201-250=17 units before meal 251-300=18 units before meal 301-350=19 units before meal 351-400=20 units before meal Over 400=22 units before meal 03/08/14  Yes Hosie Poisson, MD  levothyroxine (SYNTHROID, LEVOTHROID) 88 MCG tablet Take 88 mcg by mouth daily before breakfast.   Yes Historical Provider, MD  Omega-3 Fatty Acids (FISH OIL) 1000 MG CAPS Take 1,000 mg by mouth 2 (two) times  daily.   Yes Historical Provider, MD  albuterol (PROVENTIL HFA;VENTOLIN HFA) 108 (90 BASE) MCG/ACT inhaler Inhale 2 puffs into the lungs every 6 (six) hours as needed for wheezing or shortness of breath.    Historical Provider, MD  albuterol (PROVENTIL) (2.5 MG/3ML) 0.083% nebulizer solution Take 2.5 mg by nebulization every 6 (six) hours as needed for wheezing or shortness of breath.     Historical Provider, MD  nitroGLYCERIN (NITROSTAT) 0.4 MG SL tablet Place 0.4 mg under the tongue every 5 (five) minutes as needed for chest pain.    Historical Provider, MD   Current Facility-Administered Medications  Medication Dose Route Frequency Provider Last Rate Last Dose  . 0.9 %  sodium chloride infusion   Intravenous Continuous Angelia Mould, MD      . 0.9 %  sodium chloride infusion  500 mL Intravenous Once PRN Ulyses Amor, PA-C      . 0.9 %  sodium chloride infusion   Intravenous Continuous Ulyses Amor, PA-C 125 mL/hr at 10/27/15 1339    . Chlorhexidine Gluconate Cloth 2 % PADS 6 each  6 each Topical Once Angelia Mould, MD       And  . Chlorhexidine Gluconate Cloth 2 %  PADS 6 each  6 each Topical Once Angelia Mould, MD      . HYDROmorphone (DILAUDID) 1 MG/ML injection           . HYDROmorphone (DILAUDID) injection 0.25-0.5 mg  0.25-0.5 mg Intravenous Q5 min PRN Rica Koyanagi, MD   0.25 mg at 10/27/15 1341  . oxyCODONE-acetaminophen (PERCOCET/ROXICET) 5-325 MG per tablet 1-2 tablet  1-2 tablet Oral Q4H PRN Ulyses Amor, PA-C      . promethazine (PHENERGAN) injection 6.25-12.5 mg  6.25-12.5 mg Intravenous Q15 min PRN Rica Koyanagi, MD       Labs: Basic Metabolic Panel:  Recent Labs Lab 10/21/15 1415 10/27/15 0638  NA 139 139  K 4.2 4.4  CL 102  --   CO2 21*  --   GLUCOSE 180* 189*  BUN 41*  --   CREATININE 5.17*  --   CALCIUM 9.9  --    Liver Function Tests:  Recent Labs Lab 10/21/15 1415  AST 16  ALT 13*  ALKPHOS 82  BILITOT 0.4  PROT 7.0  ALBUMIN 2.9*  CBC:  Recent Labs Lab 10/21/15 1415 10/27/15 0638  WBC 10.4  --   HGB 12.6 13.6  HCT 39.8 40.0  MCV 91.3  --   PLT 326  --   CBG:  Recent Labs Lab 10/21/15 1334 10/27/15 0619 10/27/15 1231  GLUCAP 161* 177* 105*  ROS: As per HPI otherwise negative.  Physical Exam: Filed Vitals:   10/27/15 1300 10/27/15 1315 10/27/15 1330 10/27/15 1345  BP: 95/48 91/43 103/42 99/40  Pulse: 64 63 63 61  Temp:      TempSrc:      Resp: 18 13 15 18   Weight:      SpO2: 97% 96% 98% 96%     General: pale elderly obese WF uncomfortable post surgery Head: Normocephalic, atraumatic, sclera non-icteric, mucus membranes dry Neck: Supple. JVD not elevated. Lungs: Clear bilaterally anteriorly. Breathing is unlabored. Heart: RRR with S1 S2. No murmurs, rubs, or gallops appreciated. Abdomen: Soft, right LQ tender, non-distended  Lower extremities: bruising right groin, tr LE edema right LE wrapped; right great toe dry gangrene an slight erythema surrounding tissue; right foot warm Neuro:  Drowsy rouses to answer questions Dialysis  Access: right IJ  Dialysis Orders:  TTS [place] 160  400/! 1.5 EDW 88 right IJ heparin 4000 2K 3.5 Ca Mircera 50 q 4 weeks - last dose 2/21, calcitriol 0.75 venofer 50 /week - last 2/23 Recent labs: Hgb 11.7 32% sat ferritin 763 iPTH 690- down a little, Ca/P ok; average gains +/- 2 L - gets to EDW  Assessment/Plan: 1.  s/p RLE endarterectomy and bypass graft - per Dr. Scot Dock - hold heparin  2.  ESRD -  TTS - last HD was Saturday istat K was 4.4 - repeat post op - try to defer HD until tomorrow - no over indication at this point; prior failed accesses and ligated access due to steal. Not clear if she ever will be a good candidate - using catheter for now- hold dialysis heparin 3.  Hypertension/volume  - BP down some likely due to surgery /meds - no significant volume excess 4.  Anemia  - no ESA for now, suspect hgb will come down some due to surgery - continue weekly Fe - ESA due for redose in 3 weeks -  5.  Metabolic bone disease - normally dialyzes on 3.5 Ca bath, 0.75 calcitriol, sensipar 30 and fosrenol 1 gm ac-looking back, at one point she was on 2 of calcitriol - probably could use a lower Ca bath and higher calcitriol dose- follow while here 6.  Nutrition - needs renal carb mod/vit when eating 7. DM - per primary 8. CAD  Myriam Jacobson, PA-C [place] [phone] 10/27/2015, 1:47 PM

## 2015-10-27 NOTE — Op Note (Signed)
NAME: [name]   MRN: [id number] DOB: Nov 26, 1934    DATE OF OPERATION: 10/27/2015  PREOP DIAGNOSIS: Critical limb ischemia right lower extremity  POSTOP DIAGNOSIS: Same  PROCEDURE:  1. Harvest of right great saphenous vein  2. Right common femoral artery endarterectomy with vein patch angioplasty 3. Right common femoral artery to below knee popliteal artery bypass with 6 mm PTFE graft 4. Intraoperative arteriogram  SURGEON: Judeth Cornfield. Scot Dock, MD, FACS  ASSIST: Gerri Lins PA  ANESTHESIA: Gen.   EBL: 100 cc  INDICATIONS: [name] is a 81 y.o. female Who presented with a gangrenous wound on her right great toe and evidence of severe infrainguinal arterial occlusive disease. Arteriogram showed superficial femoral artery occlusion with reconstitution of the below-knee popliteal artery and two-vessel runoff via the anterior tibial and peroneal arteries. Was felt that her only chance for limb salvage was attempted revascularization.  FINDINGS: Bulky calcific plaque in the common femoral artery which was endarterectomized. The great saphenous vein had calcific sclerotic disease and did not distend and therefore was not usable. Completion arteriogram showed 2 vessel runoff via the anterior tibial artery and renal arteries which had moderate disease.  TECHNIQUE:  The patient was taken to the operating room and received a general anesthetic. The right lower extremity was prepped and draped in usual sterile fashion. Her pannus was taped superiorly. An oblique incision was made above the inguinal crease and dissection carried down to the common femoral artery. There was a large bulky plaque present here. I had to dissected up under the inguinal ligament in order to find a segment of the artery to clamp. The common femoral artery was controlled distally and several side branches were controlled with red vessel loops. Using 4 additional incisions along the medial aspect of  the right leg great saphenous vein was harvested from the saphenofemoral junction to the mid calf. Branches were divided between clips and 3-0 silk ties. There was some calcium within the vein but the vein appeared reasonable size. The vein was then ligated distally and removed from its bed. However, it would not distend with heparinized saline and the vein was not felt to be adequate over the majority of its length.  I therefore elected to do a bypass with 6 mm PTFE graft. Through the distal incision, the dissection was carried down to the below-knee popliteal vein which was controlled with a vessel loop and retracted posteriorly allowing exposure of the below knee popliteal artery. A tunnel was created from this incision to the groin incision the patient was then heparinized. A 6 mm PTFE graft was then brought through the tunnel. Attention was then turned to the right groin. The external iliac artery was clamped proximally and the common femoral artery clamped distally. A longitudinal arteriotomy was made in the common femoral artery. There was a large bulky calcific plaque present. In order to sew at this level had to endarterectomize this. I extended the arteriotomy down onto the common femoral artery. Once the artery was endarterectomized using the proximal segment of vein which was reasonable and opened it longitudinally to be used as a vein patch. This was sewn using continuous 5-0 Prolene suture. A longitudinal venotomy was made in the vein patch and then the 6 mm PTFE graft was spatulated and sewn end to side to the vein patch using continuous 6-0 Prolene suture. Prior to completing this anastomosis the graft was clamped and the arteries backbled and flushed appropriately and anastomosis was completed.  Attention was then turned to the below knee popliteal artery anastomosis.  A tourniquet was placed on the upper thigh. The leg was exsanguinated with an Esmarch bandage and the tourniquet inflated to 300  mmHg. Under tourniquet control, a longitudinal arteriotomy was made in the below-knee popliteal artery. There was some calcific plaque which I removed in order to allow me to sew at this level. PTFE graft was cut the appropriate length, spatulated, and sewn end to side to the below-knee popliteal artery using continuous 6-0 Prolene suture. Prior to completing this anastomosis, the tourniquet was released. The artery was backbled and flushed properly and the anastomosis completed. Flow was reestablished to the right leg. An intraoperative arteriogram was obtained by cannulating the proximal graft. This demonstrated no technical problems with two-vessel runoff via the anterior tibial and peroneal arteries. There was a good anterior tibial signal at the completion of the procedure.  The heparin was partially reversed with protamine. Each of the leg incisions was closed with 2 deep layers of 2-0 Vicryl and the skin closed with 4-0 Vicryl. Of note 2:15 Blake drains were placed through the vein tunnel sites. The groin incision was closed with a deep layer of 2-0 Vicryl, a subcutaneous layer of 2-0 Vicryl, a second subcutaneous layer of 3-0 Vicryl, and the skin was closed with a 4-0 Vicryl. Sterile dressing was applied. Patient tolerated the procedure well and was transferred to the recovery room in stable condition. All needle and sponge counts were correct.  [name], MD, FACS [facility]  DATE OF DICTATION:   10/27/2015

## 2015-10-27 NOTE — Anesthesia Procedure Notes (Signed)
Procedure Name: Intubation Date/Time: 10/27/2015 7:44 AM Performed by: Terrill Mohr Pre-anesthesia Checklist: Patient identified, Emergency Drugs available, Suction available and Patient being monitored Patient Re-evaluated:Patient Re-evaluated prior to inductionOxygen Delivery Method: Circle system utilized Preoxygenation: Pre-oxygenation with 100% oxygen Intubation Type: IV induction Ventilation: Oral airway inserted - appropriate to patient size and Two handed mask ventilation required Laryngoscope Size: Mac and 3 Grade View: Grade I Tube type: Oral Tube size: 7.0 mm Number of attempts: 1 Airway Equipment and Method: Stylet Placement Confirmation: ETT inserted through vocal cords under direct vision,  positive ETCO2 and breath sounds checked- equal and bilateral Secured at: 23 cm Tube secured with: Tape Dental Injury: Teeth and Oropharynx as per pre-operative assessment

## 2015-10-27 NOTE — Progress Notes (Signed)
   VASCULAR SURGERY POSTOP NOTE:  * Stable post op.  *  Appreciate Renal's help. For HD tomorrow.  SUBJECTIVE: Pain well controlled.  PHYSICAL EXAM: Filed Vitals:   10/27/15 1415 10/27/15 1445 10/27/15 1457 10/27/15 1500  BP: 104/46 107/45  114/47  Pulse: 63 70  69  Temp:    97.3 F (36.3 C)  TempSrc:      Resp: 13 10  13   Weight:      SpO2: 97% 94% 95% 99%   Brisk doppler flow Right ATA  LABS: Lab Results  Component Value Date   WBC 10.4 10/21/2015   HGB 13.6 10/27/2015   HCT 40.0 10/27/2015   MCV 91.3 10/21/2015   PLT 326 10/21/2015   Lab Results  Component Value Date   CREATININE 5.17* 10/21/2015   Lab Results  Component Value Date   INR 1.08 10/21/2015   CBG (last 3)   Recent Labs  10/27/15 0619 10/27/15 1231  GLUCAP 177* 105*    Active Problems:   PAD (peripheral artery disease) ([place])    Gae Gallop [id number] 10/27/2015

## 2015-10-27 NOTE — H&P (View-Only) (Signed)
[facility] [place]  Patient name: [name] MRN: [id number] DOB: [birth year] Sex: female  REASON FOR VISIT: To discuss possible right lower extremity revascularization.  HPI: [name] is a 81 y.o. female, who I have followed with hemodialysis issues. She currently has a right IJ tunneled dialysis catheter. She is not a candidate for access in her left arm. She previously had symptoms of steal in her right upper extremity despite a normal arterial Doppler study on the right. I was considering her for right arm access. However, she developed a nonhealing wound on her right great toe. She was therefore set up for an arteriogram. 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  He was evaluated by cardiology. Her primary cardiologist is Dr. Geraldo Pitter. She was seen on 10/14/2015. Her echocardiogram was reviewed and she had a normal EF. Her case was discussed with Dr. Mare Ferrari. Given the limb threatening situation it was felt that no further cardiac workup was necessary at this time. She was felt to be at higher risk for surgery given her comorbidities however the wrist was acceptable given the potential benefit.  She normally dialyzes on Tuesdays Thursdays and Saturdays.  Past Medical History  Diagnosis Date  . Hypertension   . Heart failure [facility]) December 07, 2012    Right side  . Hypercalcemia     2nd to hyperparathyroidism  . Thyroid disease     Hyperparathyroidism  . Thyroid disease     Hypothyroidism  . Dyslipidemia   . CHF (congestive heart failure) ([place])   . Myocardial infarction ([place]) 10/28/2010  . Peripheral vascular disease ([place])   . Short-term memory loss   . Stroke ([place]) 2010ish    no residual  . Shingles 2010 ish  . Anginal pain ([place])   . Coronary artery disease   . Diabetes mellitus without complication ([place])     TYPE 2  . Shortness of breath   .  Parathyroid cyst ([place])   . Headache(784.0)     CALCIFICATION LT SKULL  . Arthritis     Gout  . Cancer ([place])     kidney left removed  . Sleep apnea     Recently test in [place](NEG) NO CPAP   . ESRD (end stage renal disease) ([place])     dialysis T-TH-SAT  . Hypothyroidism     Family History  Problem Relation Age of Onset  . Diabetes Sister   . Heart disease Sister     before age 92  . Hyperlipidemia Sister   . Hypertension Sister   . Diabetes Sister   . Hyperlipidemia Sister   . Hypertension Sister     SOCIAL HISTORY: Social History   Social History  . Marital Status: Widowed    Spouse Name: N/A  . Number of Children: N/A  . Years of Education: N/A   Occupational History  . Not on file.   Social History Main Topics  . Smoking status: Former Smoker -- 46 years    Types: Cigarettes    Quit date: 01/08/1998  . Smokeless tobacco: Never Used  . Alcohol Use: No  . Drug Use: No  . Sexual Activity: Not on file   Other Topics Concern  . Not on file   Social History Narrative    Allergies  Allergen Reactions  . Macrobid [Nitrofurantoin Macrocrystal] Anaphylaxis and Rash  . Ace Inhibitors Other (See Comments)    hyperkalemia  .  Angiotensin Receptor Blockers Other (See Comments)    hyperkalemia  . Influenza Vaccines Itching, Rash and Other (See Comments)    blisters    Current Outpatient Prescriptions  Medication Sig Dispense Refill  . acetaminophen (TYLENOL) 500 MG tablet Take 1,000 mg by mouth 3 (three) times daily as needed for mild pain.     Marland Kitchen albuterol (PROVENTIL HFA;VENTOLIN HFA) 108 (90 BASE) MCG/ACT inhaler Inhale 2 puffs into the lungs every 6 (six) hours as needed for wheezing or shortness of breath.    Marland Kitchen albuterol (PROVENTIL) (2.5 MG/3ML) 0.083% nebulizer solution Take 2.5 mg by nebulization every 6 (six) hours as needed for wheezing or shortness of breath.     Marland Kitchen aspirin 325 MG tablet Take 325 mg by mouth daily.    . calcitRIOL (ROCALTROL) 0.25 MCG  capsule Take 0.25 mcg by mouth 3 (three) times a week. Tuesday, Thursday, and Saturday.    . calcium acetate (PHOSLO) 667 MG capsule Take 1,334 mg by mouth 3 (three) times daily with meals.     . cephALEXin (KEFLEX) 250 MG capsule Take 1 capsule (250 mg total) by mouth 3 (three) times daily. 42 capsule 1  . cinacalcet (SENSIPAR) 30 MG tablet Take 30 mg by mouth daily with supper.    . fenofibrate 54 MG tablet Take 54 mg by mouth daily.     . folic acid-vitamin b complex-vitamin c-selenium-zinc (DIALYVITE) 3 MG TABS tablet Take 1 tablet by mouth daily.    Marland Kitchen FOSRENOL 1000 MG chewable tablet Chew 1,000 mg by mouth 3 (three) times daily before meals.    . gabapentin (NEURONTIN) 400 MG capsule Take 400 mg by mouth 3 (three) times daily.    . insulin aspart (NOVOLOG) 100 UNIT/ML injection Inject 0-22 Units into the skin 3 (three) times daily with meals. CBG: 0-70= 0 units 71-100=9 units after meal 101-150=14 before meal 151-200=16 units before meal 201-250=17 units before meal 251-300=18 units before meal 301-350=19 units before meal 351-400=20 units before meal Over 400=22 units before meal    . levothyroxine (SYNTHROID, LEVOTHROID) 88 MCG tablet Take 88 mcg by mouth daily before breakfast.    . nitroGLYCERIN (NITROSTAT) 0.4 MG SL tablet Place 0.4 mg under the tongue every 5 (five) minutes as needed for chest pain.    . Omega-3 Fatty Acids (FISH OIL) 1000 MG CAPS Take 1,000 mg by mouth 2 (two) times daily.     No current facility-administered medications for this visit.    REVIEW OF SYSTEMS:  [X]  denotes positive finding, [ ]  denotes negative finding Cardiac  Comments:  Chest pain or chest pressure:    Shortness of breath upon exertion:    Short of breath when lying flat:    Irregular heart rhythm:        Vascular    Pain in calf, thigh, or hip brought on by ambulation:    Pain in feet at night that wakes you up from your sleep:     Blood clot in your veins:    Leg swelling:           Pulmonary    Oxygen at home:    Productive cough:     Wheezing:         Neurologic    Sudden weakness in arms or legs:     Sudden numbness in arms or legs:     Sudden onset of difficulty speaking or slurred speech:    Temporary loss of vision in one eye:  Problems with dizziness:         Gastrointestinal    Blood in stool:     Vomited blood:         Genitourinary    Burning when urinating:     Blood in urine:        Psychiatric    Major depression:         Hematologic    Bleeding problems:    Problems with blood clotting too easily:        Skin    Rashes or ulcers:        Constitutional    Fever or chills:      PHYSICAL EXAM: Filed Vitals:   10/21/15 1219  BP: 140/71  Pulse: 79  Temp: 97.9 F (36.6 C)  TempSrc: Oral  Resp: 18  Height: 5' (1.524 m)  Weight: 196 lb 8 oz (89.132 kg)  SpO2: 98%    GENERAL: The patient is a well-nourished female, in no acute distress. The vital signs are documented above. CARDIAC: There is a regular rate and rhythm.  VASCULAR: she has palpable femoral pulses. PULMONARY: There is good air exchange bilaterally without wheezing or rales. ABDOMEN: Soft and non-tender with normal pitched bowel sounds.  MUSCULOSKELETAL: There are no major deformities or cyanosis. NEUROLOGIC: No focal weakness or paresthesias are detected. SKIN: she has dry gangrene of the right great toe with some mild cellulitis of the forefoot. PSYCHIATRIC: The patient has a normal affect.  DATA:   ARTERIOGRAM: I have reviewed the images of her arteriogram. On the right side, which is the symptom attic site, she has a eccentric plaque in the common femoral artery. She has moderate to severe disease of the superficial femoral artery with occlusion of the popliteal artery above the knee. There is reconstitution of the below-knee popliteal artery with two-vessel runoff via the anterior tibial and peroneal arteries.  There was no proximal left subclavian artery  stenosis noted.  VEIN MAP: I have independently interpreted her bilateral lower extremity vein map. The vein in the right thigh there is down to 0.22 cm in the distal thigh. The vein otherwise appears reasonable in size. Likewise the vein on the left looks reasonable.  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 I think without revascularization the wound will not heal nor would she heal a toe amputation. Her only option for revascularization is a right femoral to below-knee popliteal artery bypass. Her vein narrows down in some areas but hopefully she can have bypass with autogenous graft. I have reviewed the indications for lower extremity bypass. I have also reviewed the potential complications of surgery including but not limited to: wound healing problems, infection, graft thrombosis, limb loss, or other unpredictable medical problems. All the patient's questions were answered and they are agreeable to proceed. Certainly her risk is increased because of her age, obesity, end-stage renal disease, and other multiple comorbidities. However without revascularization she will require primary amputation which we have discussed. Her surgery is scheduled for a 10/27/15.   [name] [facility] [place]: [phone]

## 2015-10-27 NOTE — Anesthesia Postprocedure Evaluation (Signed)
Anesthesia Post Note  Patient: [name]  Procedure(s) Performed: Procedure(s) (LRB): BYPASS GRAFT FEMORAL-POPLITEAL ARTERY USING 6MM X 80CM GORE PROPATEN VASCULAR GRAFT (Right) ENDARTERECTOMY RIGHT COMMON FEMORAL ARTERY (Right) VEIN HARVEST RIGHT GREATER SAPHENOUS (Right) VEIN PATCH ANGIOPLASTY RIGHT COMMON FEMORAL ARTERY (Right) INTRA OPERATIVE ARTERIOGRAM RIGHT LOWER LEG (Right)  Patient location during evaluation: PACU Anesthesia Type: General Level of consciousness: awake and alert Pain management: pain level controlled Vital Signs Assessment: post-procedure vital signs reviewed and stable Respiratory status: spontaneous breathing, nonlabored ventilation, respiratory function stable and patient connected to nasal cannula oxygen Cardiovascular status: blood pressure returned to baseline and stable Postop Assessment: no signs of nausea or vomiting Anesthetic complications: no    Last Vitals:  Filed Vitals:   10/27/15 1330 10/27/15 1345  BP: 103/42 99/40  Pulse: 63 61  Temp:    Resp: 15 18    Last Pain:  Filed Vitals:   10/27/15 1359  PainSc: Asleep                 Zenaida Deed

## 2015-10-27 NOTE — Transfer of Care (Signed)
Immediate Anesthesia Transfer of Care Note  Patient: [name]  Procedure(s) Performed: Procedure(s): BYPASS GRAFT FEMORAL-POPLITEAL ARTERY USING 6MM X 80CM GORE PROPATEN VASCULAR GRAFT (Right) ENDARTERECTOMY RIGHT COMMON FEMORAL ARTERY (Right) VEIN HARVEST RIGHT GREATER SAPHENOUS (Right) VEIN PATCH ANGIOPLASTY RIGHT COMMON FEMORAL ARTERY (Right) INTRA OPERATIVE ARTERIOGRAM RIGHT LOWER LEG (Right)  Patient Location: PACU  Anesthesia Type:General  Level of Consciousness: awake and patient cooperative  Airway & Oxygen Therapy: Awake and breathing spont with Face Mask O2  Post-op Assessment: Report given to RN  Post vital signs: Reviewed and stable  Last Vitals:  Filed Vitals:   10/27/15 0613  BP: 156/66  Pulse: 81  Temp: 36.6 C  Resp: 18    Complications: No apparent anesthesia complications

## 2015-10-27 NOTE — Progress Notes (Signed)
Pharmacy Consult: Antibiotics renal dose adjustment  24 YOF s/p R Fem-Pop bypass graft, getting zinacef for post-op prophylaxis. Pharmacy is consulted for antibiotics renal dose adjustment. Pt with ESRD on HD TTS, per renal note, will defer HD until tomorrow. She received pre-op dose zinacef 1.5g at 0742.  Plan: Zinacef 1.5 g IV x 1 doses at 0800 on 3/1 (give after HD if HD in the morning) Pharmacy sign off.   Thanks.  Maryanna Shape, PharmD, BCPS  Clinical Pharmacist  Pager: [phone]

## 2015-10-28 ENCOUNTER — Encounter (HOSPITAL_COMMUNITY): Payer: Self-pay | Admitting: Vascular Surgery

## 2015-10-28 ENCOUNTER — Encounter: Payer: Self-pay | Admitting: Vascular Surgery

## 2015-10-28 ENCOUNTER — Encounter (HOSPITAL_COMMUNITY): Payer: Medicare Other

## 2015-10-28 LAB — RENAL FUNCTION PANEL
Albumin: 2.3 g/dL — ABNORMAL LOW (ref 3.5–5.0)
Albumin: 2.5 g/dL — ABNORMAL LOW (ref 3.5–5.0)
Anion gap: 12 (ref 5–15)
Anion gap: 16 — ABNORMAL HIGH (ref 5–15)
BUN: 55 mg/dL — ABNORMAL HIGH (ref 6–20)
BUN: 19 mg/dL (ref 6–20)
CO2: 21 mmol/L — ABNORMAL LOW (ref 22–32)
CO2: 24 mmol/L (ref 22–32)
Calcium: 7.9 mg/dL — ABNORMAL LOW (ref 8.9–10.3)
Calcium: 9 mg/dL (ref 8.9–10.3)
Chloride: 102 mmol/L (ref 101–111)
Chloride: 95 mmol/L — ABNORMAL LOW (ref 101–111)
Creatinine, Ser: 6.37 mg/dL — ABNORMAL HIGH (ref 0.44–1.00)
Creatinine, Ser: 3.69 mg/dL — ABNORMAL HIGH (ref 0.44–1.00)
GFR calc Af Amer: 6 mL/min — ABNORMAL LOW (ref >60)
GFR calc Af Amer: 13 mL/min — ABNORMAL LOW (ref >60)
GFR calc non Af Amer: 6 mL/min — ABNORMAL LOW (ref >60)
GFR calc non Af Amer: 11 mL/min — ABNORMAL LOW (ref >60)
Glucose, Bld: 214 mg/dL — ABNORMAL HIGH (ref 65–99)
Glucose, Bld: 170 mg/dL — ABNORMAL HIGH (ref 65–99)
Phosphorus: 4.4 mg/dL (ref 2.5–4.6)
Phosphorus: 3.7 mg/dL (ref 2.5–4.6)
Potassium: 5.3 mmol/L — ABNORMAL HIGH (ref 3.5–5.1)
Potassium: 4.8 mmol/L (ref 3.5–5.1)
Sodium: 135 mmol/L (ref 135–145)
Sodium: 135 mmol/L (ref 135–145)

## 2015-10-28 LAB — GLUCOSE, CAPILLARY
GLUCOSE-CAPILLARY: 169 mg/dL — AB (ref 65–99)
GLUCOSE-CAPILLARY: 130 mg/dL — AB (ref 65–99)
Glucose-Capillary: 163 mg/dL — ABNORMAL HIGH (ref 65–99)
Glucose-Capillary: 133 mg/dL — ABNORMAL HIGH (ref 65–99)
Glucose-Capillary: 202 mg/dL — ABNORMAL HIGH (ref 65–99)
Glucose-Capillary: 148 mg/dL — ABNORMAL HIGH (ref 65–99)

## 2015-10-28 LAB — CBC
HCT: 34.1 % — ABNORMAL LOW (ref 36.0–46.0)
HCT: 36.7 % (ref 36.0–46.0)
Hemoglobin: 10.3 g/dL — ABNORMAL LOW (ref 12.0–15.0)
Hemoglobin: 11.8 g/dL — ABNORMAL LOW (ref 12.0–15.0)
MCH: 28.6 pg (ref 26.0–34.0)
MCH: 29.9 pg (ref 26.0–34.0)
MCHC: 30.2 g/dL (ref 30.0–36.0)
MCHC: 32.2 g/dL (ref 30.0–36.0)
MCV: 94.7 fL (ref 78.0–100.0)
MCV: 93.1 fL (ref 78.0–100.0)
Platelets: 250 10*3/uL (ref 150–400)
Platelets: ADEQUATE 10*3/uL (ref 150–400)
RBC: 3.6 MIL/uL — ABNORMAL LOW (ref 3.87–5.11)
RBC: 3.94 MIL/uL (ref 3.87–5.11)
RDW: 14.6 % (ref 11.5–15.5)
RDW: 14.6 % (ref 11.5–15.5)
WBC: 11 10*3/uL — ABNORMAL HIGH (ref 4.0–10.5)
WBC: 11 10*3/uL — ABNORMAL HIGH (ref 4.0–10.5)

## 2015-10-28 MED ORDER — ALTEPLASE 2 MG IJ SOLR
2.0000 mg | Freq: Once | INTRAMUSCULAR | Status: DC | PRN
Start: 2015-10-28 — End: 2015-10-29
  Filled 2015-10-28: qty 2

## 2015-10-28 MED ORDER — DEXTROSE 5 % IV SOLN
1.5000 g | Freq: Once | INTRAVENOUS | Status: AC
  Administered 2015-10-28: 1.5 g via INTRAVENOUS
  Filled 2015-10-28: qty 1.5

## 2015-10-28 MED ORDER — PENTAFLUOROPROP-TETRAFLUOROETH EX AERO: 1.0000 "application " | INHALATION_SPRAY | CUTANEOUS | Status: DC | PRN

## 2015-10-28 MED ORDER — LIDOCAINE-PRILOCAINE 2.5-2.5 % EX CREA
1.0000 "application " | TOPICAL_CREAM | CUTANEOUS | Status: DC | PRN
  Filled 2015-10-28: qty 5

## 2015-10-28 MED ORDER — LIDOCAINE HCL (PF) 1 % IJ SOLN
5.0000 mL | INTRAMUSCULAR | Status: DC | PRN
Start: 2015-10-28 — End: 2015-10-29

## 2015-10-28 MED ORDER — SODIUM CHLORIDE 0.9 % IV SOLN: 100.0000 mL | INTRAVENOUS | Status: DC | PRN

## 2015-10-28 MED ORDER — HEPARIN SODIUM (PORCINE) 1000 UNIT/ML DIALYSIS: 1000.0000 [IU] | INTRAMUSCULAR | Status: DC | PRN

## 2015-10-28 MED ORDER — OXYCODONE-ACETAMINOPHEN 5-325 MG PO TABS
ORAL_TABLET | ORAL | Status: AC
  Filled 2015-10-28: qty 2

## 2015-10-28 NOTE — Progress Notes (Signed)
   VASCULAR SURGERY ASSESSMENT & PLAN:  * 1 Day Post-Op s/p: Right CFA endarterectomy with VPA and right fem BK pop bypass with PTFE  *  RENAL: for HD today.   * ID: cellulitis- Ancef IV and convert to Keflex on D/C  * Meticulous wound care to right groin  * D/C JP   SUBJECTIVE: Pain adequately controlled.   PHYSICAL EXAM: Filed Vitals:   10/27/15 2000 10/28/15 0001 10/28/15 0010 10/28/15 0400  BP: 100/50  120/45 113/44  Pulse: 67  65 65  Temp:  97.9 F (36.6 C)  97.6 F (36.4 C)  TempSrc:  Oral  Oral  Resp: 17  20 16   Weight:      SpO2: 100%  100% 99%   Brisk ATA and peroneal signal with doppler. The PT is chronically occluded.  Incisions look good. Cellulitis unchanged right foot.  LABS: Lab Results  Component Value Date   WBC 14.3* 10/27/2015   HGB 11.4* 10/27/2015   HCT 37.0 10/27/2015   MCV 93.2 10/27/2015   PLT 254 10/27/2015   Lab Results  Component Value Date   CREATININE 5.65* 10/27/2015    CBG (last 3)   Recent Labs  10/27/15 1231 10/27/15 1746 10/27/15 2106  GLUCAP 105* 156* 135*    Active Problems:   PAD (peripheral artery disease) ([place])    Gae Gallop [id number] 10/28/2015

## 2015-10-28 NOTE — Progress Notes (Signed)
Patient arrived to unit by bed.  Reviewed treatment plan and this RN agrees with plan.  Report received from bedside RN, Jarrett Soho.  Consent obtained.  Patient A&OX4.   Lung sounds diminished to ausculation in all fields. Generazlied edema. Cardiac:  Regular R&R, bradycardic at times.  Removed caps and cleansed RIJ catheter with chlorhedxidine.  Aspirated ports of heparin and flushed them with saline per protocol.  Connected and secured lines, initiated treatment at 0903.  UF Goal of 2585mL and net fluid removal 2L.  Nephrologist at bedside at start of treatment.  Verbal order received to change treatment run time from 4 hours to 3.5 hours.  Will continue to monitor.

## 2015-10-28 NOTE — Progress Notes (Signed)
Pt returned from HD and placed back on monitor and HR now afib controlled 70-90's. No note of afib in history.  Pt is asymptomatic at this time. Notified [place], [place]. Will continue to monitor.

## 2015-10-28 NOTE — Progress Notes (Signed)
Venous chamber clotted.  Treatment paused, machine stripped and relined.  Treatment resumed.  Will continue to monitor.

## 2015-10-28 NOTE — Progress Notes (Signed)
Dialysis treatment completed.  2500 mL ultrafiltrated.  1500 mL net fluid removal.  Patient status unchanged. Lung sounds diminished to ausculation in all fields. Generalized edema. Cardiac: Regular R&R, some ectopy noted.  Cleansed RIJ catheter with chlorhexidine.  Disconnected lines and flushed ports with saline per protocol.  Ports locked with heparin and capped per protocol.  Dressing changed  Report given to bedside, RN Jarrett Soho.

## 2015-10-28 NOTE — Evaluation (Signed)
Physical Therapy Evaluation Patient Details Name: [name] MRN: [id number] DOB: 07-10-1935 Today's Date: 10/28/2015   History of Present Illness  MEGGIE CUMINGS is a 81 y.o. female with ESRD, DM, HTN, PVD, CAD,multiple vascular access problems, hx solitary kidney due to prior nephrectomy for cancer on TTS dialysis in [place] is admitted following RCF endarterectomy and RLE bypass for critical limb ischemia.   Clinical Impression  Pt s/p R fem-pop Day 1. Limited by surgical pain and generalized weakness. Pt requiring Min A for all mobility and transfers and displays extremely slow gait with RW and decreased activity tolerance. Limited ambulation to inside room today but educated pt on importance of activity throughout the day. Pt lives alone and was Mod I for all activities. At this time recommending ST-SNF however depending on mobility progress pt may be appropriate for HHPT and intermittent supervision from family.     Follow Up Recommendations SNF;Supervision/Assistance - 24 hour (HHPT if progressing well with independence and mobility)    Equipment Recommendations  None recommended by PT    Recommendations for Other Services       Precautions / Restrictions Precautions Precautions: Fall Restrictions Weight Bearing Restrictions: No      Mobility  Bed Mobility Overal bed mobility: Needs Assistance Bed Mobility: Sit to Supine       Sit to supine: Mod assist   General bed mobility comments: Asssist to bring LEs into bed, +2 to slide pt up in bed after unsuccessful trials to bridge and pull with UE  Transfers Overall transfer level: Needs assistance Equipment used: Rolling walker (2 wheeled) Transfers: Sit to/from Stand Sit to Stand: Min assist         General transfer comment: from chair good power up, cueing needed for hand placment for safety, assist to fully right trunk with manual cueing for hip extension and shoulder  retraction  Ambulation/Gait Ambulation/Gait assistance: Min assist Ambulation Distance (Feet): 10 Feet Assistive device: Rolling walker (2 wheeled) Gait Pattern/deviations: Step-to pattern;Decreased step length - left;Decreased stride length;Decreased weight shift to right;Shuffle;Antalgic Gait velocity: very slow Gait velocity interpretation: Below normal speed for age/gender General Gait Details: limited by pain, mild instability which improved with distance, excessive guarding of surgical leg, cues for weight shift and RLE ROM  Stairs            Wheelchair Mobility    Modified Rankin (Stroke Patients Only)       Balance Overall balance assessment: Needs assistance Sitting-balance support: Feet supported;No upper extremity supported Sitting balance-Leahy Scale: Good     Standing balance support: Bilateral upper extremity supported;During functional activity Standing balance-Leahy Scale: Fair                               Pertinent Vitals/Pain Pain Assessment: Faces Faces Pain Scale: Hurts even more Pain Location: R leg Pain Descriptors / Indicators: Operative site guarding Pain Intervention(s): Limited activity within patient's tolerance;Monitored during session    [place] expects to be discharged to:: Private residence Living Arrangements: Alone Available Help at Discharge: Family;Available PRN/intermittently Type of Home: House Home Access: Ramped entrance     Home Layout: One level Home Equipment: [place] - 4 wheels;Shower seat;Grab bars - toilet Additional Comments: son checks in on pt ferequently    Prior Function Level of Independence: Independent with assistive device(s)         Comments: pt drives occasionally to the grocery store/hair appts, typically son/family  drive pt     Hand Dominance        Extremity/Trunk Assessment   Upper Extremity Assessment: Generalized weakness           Lower Extremity  Assessment: Generalized weakness;RLE deficits/detail RLE Deficits / Details: pain due to surgical site, decreased AROM in hip and knee    Cervical / Trunk Assessment: Normal  Communication   Communication: No difficulties  Cognition Arousal/Alertness: Awake/alert Behavior During Therapy: WFL for tasks assessed/performed Overall Cognitive Status: Within Functional Limits for tasks assessed                      General Comments General comments (skin integrity, edema, etc.): extensive time spent educating on importance of moving and increasing ambualtion throughout the day    Exercises General Exercises - Lower Extremity Ankle Circles/Pumps: AROM;Both;10 reps;Seated Long Arc Quad: AROM;Both;10 reps;Seated (limted R ROM)      Assessment/Plan    PT Assessment Patient needs continued PT services  PT Diagnosis Difficulty walking;Acute pain;Generalized weakness   PT Problem List Decreased strength;Decreased range of motion;Decreased activity tolerance;Decreased balance;Decreased mobility;Decreased coordination;Pain  PT Treatment Interventions Gait training;DME instruction;Functional mobility training;Therapeutic activities;Therapeutic exercise;Balance training;Neuromuscular re-education   PT Goals (Current goals can be found in the Care Plan section) Acute Rehab PT Goals Patient Stated Goal: decrease pain PT Goal Formulation: With patient Time For Goal Achievement: 11/11/15 Potential to Achieve Goals: Good    Frequency Min 3X/week   Barriers to discharge Decreased caregiver support son works during the day    Co-evaluation               End of Session Equipment Utilized During Treatment: Gait belt Activity Tolerance: Patient tolerated treatment well;Patient limited by pain Patient left: in bed;with call bell/phone within reach Nurse Communication: Mobility status         Time: [id number] PT Time Calculation (min) (ACUTE ONLY): 25 min   Charges:   PT  Evaluation $PT Eval Moderate Complexity: 1 Procedure PT Treatments $Gait Training: 8-22 mins   PT G Codes:        Ara Kussmaul Nov 14, 2015, 11:33 AM Ara Kussmaul, Student Physical Therapist Acute Rehab [phone]

## 2015-10-28 NOTE — Procedures (Signed)
I was present at this dialysis session. I have reviewed the session itself and made appropriate changes.   Doing well. 2K bath.Hb stable UF goal 2L.   Pearson Grippe  MD 10/28/2015, 9:13 AM

## 2015-10-29 ENCOUNTER — Inpatient Hospital Stay (HOSPITAL_COMMUNITY): Payer: Medicare Other

## 2015-10-29 DIAGNOSIS — Z9889 Other specified postprocedural states: Secondary | ICD-10-CM

## 2015-10-29 LAB — GLUCOSE, CAPILLARY
GLUCOSE-CAPILLARY: 188 mg/dL — AB (ref 65–99)
GLUCOSE-CAPILLARY: 138 mg/dL — AB (ref 65–99)
GLUCOSE-CAPILLARY: 124 mg/dL — AB (ref 65–99)
Glucose-Capillary: 74 mg/dL (ref 65–99)

## 2015-10-29 LAB — RENAL FUNCTION PANEL
ALBUMIN: 2.5 g/dL — AB (ref 3.5–5.0)
ANION GAP: 12 (ref 5–15)
BUN: 30 mg/dL — ABNORMAL HIGH (ref 6–20)
CALCIUM: 8.8 mg/dL — AB (ref 8.9–10.3)
CO2: 25 mmol/L (ref 22–32)
CREATININE: 5.11 mg/dL — AB (ref 0.44–1.00)
Chloride: 96 mmol/L — ABNORMAL LOW (ref 101–111)
GFR calc non Af Amer: 7 mL/min — ABNORMAL LOW (ref >60)
GFR, EST AFRICAN AMERICAN: 8 mL/min — AB (ref >60)
Glucose, Bld: 104 mg/dL — ABNORMAL HIGH (ref 65–99)
PHOSPHORUS: 3.9 mg/dL (ref 2.5–4.6)
Potassium: 4 mmol/L (ref 3.5–5.1)
SODIUM: 133 mmol/L — AB (ref 135–145)

## 2015-10-29 LAB — CBC
HCT: 33.5 % — ABNORMAL LOW (ref 36.0–46.0)
HEMOGLOBIN: 10.8 g/dL — AB (ref 12.0–15.0)
MCH: 30.2 pg (ref 26.0–34.0)
MCHC: 32.2 g/dL (ref 30.0–36.0)
MCV: 93.6 fL (ref 78.0–100.0)
PLATELETS: 261 10*3/uL (ref 150–400)
RBC: 3.58 MIL/uL — AB (ref 3.87–5.11)
RDW: 14.8 % (ref 11.5–15.5)
WBC: 12 10*3/uL — AB (ref 4.0–10.5)

## 2015-10-29 MED ORDER — HEPARIN SODIUM (PORCINE) 1000 UNIT/ML DIALYSIS: 1000.0000 [IU] | INTRAMUSCULAR | Status: DC | PRN

## 2015-10-29 MED ORDER — SODIUM CHLORIDE 0.9 % IV SOLN: 100.0000 mL | INTRAVENOUS | Status: DC | PRN

## 2015-10-29 MED ORDER — LIDOCAINE-PRILOCAINE 2.5-2.5 % EX CREA
1.0000 "application " | TOPICAL_CREAM | CUTANEOUS | Status: DC | PRN
  Filled 2015-10-29: qty 5

## 2015-10-29 MED ORDER — PENTAFLUOROPROP-TETRAFLUOROETH EX AERO: 1.0000 "application " | INHALATION_SPRAY | CUTANEOUS | Status: DC | PRN

## 2015-10-29 MED ORDER — LIDOCAINE HCL (PF) 1 % IJ SOLN: 5.0000 mL | INTRAMUSCULAR | Status: DC | PRN

## 2015-10-29 MED ORDER — ALTEPLASE 2 MG IJ SOLR
2.0000 mg | Freq: Once | INTRAMUSCULAR | Status: DC | PRN
  Filled 2015-10-29: qty 2

## 2015-10-29 MED ORDER — CALCITRIOL 0.5 MCG PO CAPS
1.0000 ug | ORAL_CAPSULE | ORAL | Status: DC
  Administered 2015-10-29 – 2015-11-03 (×3): 1 ug via ORAL
  Filled 2015-10-29: qty 2

## 2015-10-29 MED ORDER — CALCITRIOL 0.5 MCG PO CAPS
ORAL_CAPSULE | ORAL | Status: AC
  Filled 2015-10-29: qty 2

## 2015-10-29 NOTE — Progress Notes (Signed)
[facility] Progress Note  Dialysis Orders: TTS [place] 160 400/! 1.5 EDW 88 right IJ heparin 4000 2K 3.5 Ca Mircera 50 q 4 weeks - last dose 2/21, calcitriol 0.75 venofer 50 /week - last 2/23 Recent labs: Hgb 11.7 32% sat ferritin 763 iPTH 690- down a little, Ca/P ok; average gains +/- 2 L - gets to EDW  Assessment/Plan: 1. s/p RLE endarterectomy and bypass graft - per Dr. Scot Dock - holding heparin  2. ESRD - TTS - off schedule - had Wed for Tues prior failed accesses and ligated access due to steal. Not clear if she ever will be a good candidate - using catheter for now- plan HD 3 hours today to get back on schedule  3. Hypertension/volume - BP ok - no significant volume excess; UF 1.5 Wed on HD post wt 91.1 4. Anemia - no ESA for now, hd 11.8 post op - recheck today continue weekly Fe - ESA due for redose in 3 weeks -  5. Metabolic bone disease - normally dialyzes on 3.5 Ca bath, 0.75 calcitriol, sensipar 30 and fosrenol 1 gm ac-looking back, at one point she was on 2 of calcitriol - probably could use a lower Ca bath and higher calcitriol dose; plan increase calcitriol to 1 and use 2.5 Ca bath 6. Nutrition - needs renal carb mod/vit when eating 7. DM - per primary 8. CAD  Myriam Jacobson, PA-C [facility] Beeper [phone] 10/29/2015,9:30 AM  LOS: 2 days   Subjective:   Eating breakfast  Objective Filed Vitals:   10/28/15 1900 10/28/15 2052 10/29/15 0129 10/29/15 0610  BP:  118/41 127/63 119/41  Pulse:  78 66 78  Temp:  98.2 F (36.8 C) 98.1 F (36.7 C) 98.2 F (36.8 C)  TempSrc:  Oral Oral Oral  Resp:  15 16 16   Height: 5' (1.524 m)     Weight:      SpO2:  98% 98% 94%   Physical Exam General: NAD Heart: RRR Lungs: no rales Abdomen: obese Extremities: tr -1+ RLE edema LE are obese so volume a little hard to tell Dialysis Access: right IJ  Additional Objective Labs: Basic Metabolic Panel:  Recent Labs Lab 10/27/15 1538  10/28/15 0900 10/28/15 2035  NA 139 135 135  K 5.1 5.3* 4.8  CL 106 102 95*  CO2 20* 21* 24  GLUCOSE 170* 214* 170*  BUN 48* 55* 19  CREATININE 5.65* 6.37* 3.69*  CALCIUM 8.3* 7.9* 9.0  PHOS  --  4.4 3.7   Liver Function Tests:  Recent Labs Lab 10/28/15 0900 10/28/15 2035  ALBUMIN 2.3* 2.5*  CBC:  Recent Labs Lab 10/27/15 1538 10/28/15 0900 10/28/15 2035  WBC 14.3* 11.0* 11.0*  HGB 11.4* 10.3* 11.8*  HCT 37.0 34.1* 36.7  MCV 93.2 94.7 93.1  PLT 254 250 PLATELET CLUMPS NOTED ON SMEAR, COUNT APPEARS ADEQUATE   CBG:  Recent Labs Lab 10/27/15 2106 10/28/15 0744 10/28/15 1354 10/28/15 1645 10/28/15 2049  GLUCAP 135* 163* 133* 202* 148*    Studies/Results: Dg Ang/ext/uni/or Right  10/27/2015  CLINICAL DATA:  Bypass graft. EXAM: RIGHT ANG/EXT/UNI/ OR COMPARISON:  None. FINDINGS: Single angiographic image of the right lower extremity was obtained. Patent bypass graft connected to the distal popliteal artery. There is runoff in the anterior tibial artery and peroneal artery. Areas of stenosis in the right anterior tibial artery. Minor filling of the native popliteal artery. IMPRESSION: Popliteal bypass graft is patent.  2 vessel runoff in the right  leg. Electronically Signed   By: Markus Daft M.D.   On: 10/27/2015 16:29   Medications: . sodium chloride 10 mL/hr at 10/27/15 1600   . aspirin  325 mg Oral Daily  . calcitRIOL  0.75 mcg Oral Q T,Th,Sa-HD  . calcium acetate  1,334 mg Oral TID WC  . cinacalcet  30 mg Oral Q supper  . docusate sodium  100 mg Oral Daily  . enoxaparin (LOVENOX) injection  30 mg Subcutaneous Q24H  . fenofibrate  54 mg Oral Daily  . ferric gluconate (FERRLECIT/NULECIT) IV  62.5 mg Intravenous Q Thu-HD  . gabapentin  400 mg Oral TID  . insulin aspart  0-22 Units Subcutaneous TID WC  . levothyroxine  88 mcg Oral QAC breakfast  . pantoprazole  40 mg Oral Daily

## 2015-10-29 NOTE — Progress Notes (Signed)
   VASCULAR SURGERY ASSESSMENT & PLAN:  * 2 Day Post-Op s/p: Right CFA endarterectomy with VPA and right fem BK pop bypass with PTFE. Graft is patent.   * RENAL: was dialyzed yesterday. Appreciate renal's help with medical issues.   * ID: cellulitis- Ancef IV and convert to Keflex on D/C  * Meticulous wound care to right groin  * I d/c'd both JP's  * Continue PTx. Home with HHPT when safe.   SUBJECTIVE: Pain well controlled.  PHYSICAL EXAM: Filed Vitals:   10/28/15 1900 10/28/15 2052 10/29/15 0129 10/29/15 0610  BP:  118/41 127/63 119/41  Pulse:  78 66 78  Temp:  98.2 F (36.8 C) 98.1 F (36.7 C) 98.2 F (36.8 C)  TempSrc:  Oral Oral Oral  Resp:  15 16 16   Height: 5' (1.524 m)     Weight:      SpO2:  98% 98% 94%   Incisions look good Right foot is warm/ hyperemic. Cellulitis a little better Dry gangrene of toe unchanged.   CBG (last 3)   Recent Labs  10/28/15 1354 10/28/15 1645 10/28/15 2049  GLUCAP 133* 202* 148*    Active Problems:   PAD (peripheral artery disease) ([place])  Gae Gallop [id number] 10/29/2015

## 2015-10-29 NOTE — Progress Notes (Signed)
VASCULAR LAB PRELIMINARY  ARTERIAL  ABI completed:    RIGHT    LEFT    PRESSURE WAVEFORM  PRESSURE WAVEFORM  BRACHIAL 131 Triphasic BRACHIAL Restricted Limb   AT 128 Monophasic AT >255 Monophasic  PT 81 Monophasic PT  Absent  PER   PER >255 Monophasic    RIGHT LEFT  ABI 0.98 N/A   Rt ABI indicates normal arterial flow however Doppler waveforms may suggest a false elevation of pressures. Lt ABI could not be obtained due to non compressible arteries.   Orene Abbasi, RVS 10/29/2015, 1:28 PM

## 2015-10-29 NOTE — Progress Notes (Signed)
Physical Therapy Treatment Patient Details Name: [name] MRN: [id number] DOB: Nov 26, 1934 Today's Date: 10/29/2015    History of Present Illness [name] is a 81 y.o. female with ESRD, DM, HTN, PVD, CAD,multiple vascular access problems, hx solitary kidney due to prior nephrectomy for cancer on TTS dialysis in [place] is admitted following RCF endarterectomy and RLE bypass for critical limb ischemia.     PT Comments    Pt progressing slowly towards acute goals. Remains limited by pain and quick fatigue. Demonstrates good stability with RW however needed manual assist for navigation with ambulation and verbal cues throughout for forward progression. Encouraged pt to get up multiple times a day with nursing staff for short ambulation attempts.  Continuing to recommend ST-SNF to advance mobility to Mod I level before returning home.   Follow Up Recommendations  SNF;Supervision/Assistance - 24 hour     Equipment Recommendations  None recommended by PT    Recommendations for Other Services       Precautions / Restrictions Precautions Precautions: Fall Restrictions Weight Bearing Restrictions: No    Mobility  Bed Mobility               General bed mobility comments: sitting EOb upon entering room  Transfers Overall transfer level: Needs assistance Equipment used: Rolling walker (2 wheeled) Transfers: Sit to/from Stand Sit to Stand: Mod assist;From elevated surface         General transfer comment: bed elvated for ease of transfer, multiple reminders to keep hands on bed vs pulling RW, manual assist clearning hips from bed, assist to maintain hip extension  Ambulation/Gait Ambulation/Gait assistance: Min assist Ambulation Distance (Feet): 15 Feet Assistive device: Rolling walker (2 wheeled) Gait Pattern/deviations: Step-to pattern;Decreased stride length;Shuffle;Trunk flexed Gait velocity: very slow Gait velocity interpretation: Below normal  speed for age/gender General Gait Details: limited by pain in legs and arms, manual assist with RW to navigate around sink and room threshold, cueing for foot clearnance and use of UEs to off set weight through RLE   Stairs            Wheelchair Mobility    Modified Rankin (Stroke Patients Only)       Balance Overall balance assessment: Needs assistance Sitting-balance support: Feet supported;No upper extremity supported Sitting balance-Leahy Scale: Good     Standing balance support: Bilateral upper extremity supported Standing balance-Leahy Scale: Fair                      Cognition Arousal/Alertness: Awake/alert Behavior During Therapy: WFL for tasks assessed/performed Overall Cognitive Status: Within Functional Limits for tasks assessed                      Exercises General Exercises - Lower Extremity Ankle Circles/Pumps: AROM;Both;10 reps;Seated Long Arc Quad: AROM;Both;10 reps;Seated Hip Flexion/Marching: AROM;10 reps;Seated;Standing    General Comments        Pertinent Vitals/Pain Pain Assessment: Faces Faces Pain Scale: Hurts even more Pain Location: R leg and B UEs Pain Descriptors / Indicators: Discomfort;Guarding;Operative site guarding;Tiring Pain Intervention(s): Limited activity within patient's tolerance;Monitored during session    Home Living                      Prior Function            PT Goals (current goals can now be found in the care plan section) Acute Rehab PT Goals Patient Stated Goal: get up today Progress towards  PT goals: Progressing toward goals    Frequency  Min 3X/week    PT Plan Current plan remains appropriate    Co-evaluation             End of Session Equipment Utilized During Treatment: Gait belt Activity Tolerance: Patient limited by fatigue Patient left: in chair;with call bell/phone within reach     Time: 0906-0928 PT Time Calculation (min) (ACUTE ONLY): 22  min  Charges:  $Gait Training: 8-22 mins                    G Codes:      [name] November 08, 2015, 10:58 AM  [name], Student Physical Therapist Acute Rehab [phone]

## 2015-10-29 NOTE — Procedures (Signed)
I was present at this session.  I have reviewed the session itself and made appropriate changes.  Hd via cath, bfr 400. bp 110-120.    Witney Huie L 3/2/20174:10 PM

## 2015-10-29 NOTE — Evaluation (Signed)
Occupational Therapy Evaluation Patient Details Name: [name] MRN: [id number] DOB: 03-30-1935 Today's Date: 10/29/2015    History of Present Illness [name] is a 81 y.o. female with ESRD, DM, HTN, PVD, CAD,multiple vascular access problems, hx solitary kidney due to prior nephrectomy for cancer on TTS dialysis in [place] is admitted following RCF endarterectomy and RLE bypass for critical limb ischemia. Pt s/p R fem-pop.    Clinical Impression   Patient presenting with decreased ADL and functional mobility independence secondary to above. Patient independent to mod I and living alone PTA. Pt reports her son would check on her intermittently. Patient currently functioning at an overall min to mod assist level and takes increased time to complete tasks. Patient will benefit from acute OT to increase overall independence in the areas of ADLs, functional mobility, and overall safety in order to safely discharge to venue listed below.     Follow Up Recommendations  SNF;Supervision/Assistance - 24 hour    Equipment Recommendations  Other (comment) (TBD)    Recommendations for Other Services  None at this time   Precautions / Restrictions Precautions Precautions: Fall Restrictions Weight Bearing Restrictions: No    Mobility Bed Mobility General bed mobility comments: Pt found seated in recliner upon OT entering/exiting room   Transfers Overall transfer level: Needs assistance Equipment used: Rolling walker (2 wheeled) Transfers: Sit to/from Stand Sit to Stand: Min assist         General transfer comment: Min assist for safety and to power up into standing. Cues for hand placement and technique.     Balance Overall balance assessment: Needs assistance Sitting-balance support: No upper extremity supported;Feet supported Sitting balance-Leahy Scale: Good     Standing balance support: Bilateral upper extremity supported;During functional activity Standing  balance-Leahy Scale: Fair    ADL Overall ADL's : Needs assistance/impaired     Grooming: Set up;Sitting   Upper Body Bathing: Minimal assitance;Sitting   Lower Body Bathing: Moderate assistance;Sit to/from stand   Upper Body Dressing : Minimal assistance;Sitting   Lower Body Dressing: Maximal assistance;Sit to/from Health and safety inspector Details (indicate cue type and reason): Pt attempting ambulating to BR, but due to increased pain and weakness she was unable to make it all the way    Toileting - Clothing Manipulation Details (indicate cue type and reason): did not occur   Tub/Shower Transfer Details (indicate cue type and reason): did not occur  Functional mobility during ADLs: Minimal assistance;Cueing for safety;Cueing for sequencing;Rolling walker      Pertinent Vitals/Pain Pain Assessment: 0-10 Pain Score: 8  Faces Pain Scale: Hurts even more Pain Location: RLE Pain Descriptors / Indicators: Aching;Sore;Discomfort;Grimacing;Guarding Pain Intervention(s): Limited activity within patient's tolerance;Monitored during session;Repositioned     Hand Dominance Right   Extremity/Trunk Assessment Upper Extremity Assessment Upper Extremity Assessment: Generalized weakness   Lower Extremity Assessment Lower Extremity Assessment: Defer to PT evaluation   Cervical / Trunk Assessment Cervical / Trunk Assessment: Normal   Communication Communication Communication: No difficulties   Cognition Arousal/Alertness: Awake/alert Behavior During Therapy: WFL for tasks assessed/performed Overall Cognitive Status: Impaired/Different from baseline Area of Impairment: Orientation;Safety/judgement;Awareness Orientation Level: Disoriented to;Time   Memory: Decreased short-term memory   Safety/Judgement: Decreased awareness of safety;Decreased awareness of deficits Awareness: Intellectual   General Comments: Pt with original complaints of pain in L leg, when therapist talked  with how pt had surgery to RLE pt then stated pain was in right leg. Pt stated the month was August and  year was 37.               Home Living Family/patient expects to be discharged to:: Private residence Living Arrangements: Alone Available Help at Discharge: Family;Available PRN/intermittently Type of Home: House Home Access: Ramped entrance     Home Layout: One level     Bathroom Shower/Tub: Occupational psychologist: Handicapped height Bathroom Accessibility: Yes   Home Equipment: Environmental consultant - 4 wheels;Shower seat;Grab bars - toilet;Bedside commode   Additional Comments: son checks in on pt ferequently      Prior Functioning/Environment Level of Independence: Independent with assistive device(s)  Comments: pt drives occasionally to the grocery store/hair appts, typically son/family drive pt    OT Diagnosis: Generalized weakness;Acute pain   OT Problem List: Decreased strength;Decreased activity tolerance;Impaired balance (sitting and/or standing);Decreased safety awareness;Decreased knowledge of use of DME or AE;Decreased knowledge of precautions;Pain   OT Treatment/Interventions: Self-care/ADL training;Therapeutic exercise;Energy conservation;DME and/or AE instruction;Therapeutic activities;Patient/family education;Balance training    OT Goals(Current goals can be found in the care plan section) Acute Rehab OT Goals Patient Stated Goal: go to rehab before home OT Goal Formulation: With patient Time For Goal Achievement: 11/12/15 Potential to Achieve Goals: Good ADL Goals Pt Will Perform Grooming: with min guard assist;standing Pt Will Perform Lower Body Bathing: with min assist;sit to/from stand;with adaptive equipment Pt Will Perform Lower Body Dressing: with min assist;sit to/from stand;with adaptive equipment Pt Will Transfer to Toilet: ambulating;bedside commode;with min guard assist Pt/caregiver will Perform Home Exercise Program: Increased  strength;Both right and left upper extremity;With written HEP provided;With theraband;With Supervision  OT Frequency: Min 2X/week   Barriers to D/C: Decreased caregiver support   End of Session Equipment Utilized During Treatment: Gait belt;Rolling walker Nurse Communication: Mobility status  Activity Tolerance: Patient tolerated treatment well Patient left: in chair;with call bell/phone within reach;with chair alarm set   Time: [id number] OT Time Calculation (min): 17 min Charges:  OT General Charges $OT Visit: 1 Procedure OT Evaluation $OT Eval Moderate Complexity: 1 Procedure  Chrys Racer , MS, OTR/L, CLT Pager: [phone]  10/29/2015, 11:37 AM

## 2015-10-30 LAB — GLUCOSE, CAPILLARY
GLUCOSE-CAPILLARY: 164 mg/dL — AB (ref 65–99)
GLUCOSE-CAPILLARY: 77 mg/dL (ref 65–99)
Glucose-Capillary: 149 mg/dL — ABNORMAL HIGH (ref 65–99)
Glucose-Capillary: 214 mg/dL — ABNORMAL HIGH (ref 65–99)

## 2015-10-30 MED ORDER — RENA-VITE PO TABS
1.0000 | ORAL_TABLET | Freq: Every day | ORAL | Status: DC
  Administered 2015-10-30 – 2015-11-02 (×4): 1 via ORAL
  Filled 2015-10-30 (×4): qty 1

## 2015-10-30 MED ORDER — NEPRO/CARBSTEADY PO LIQD: 237.0000 mL | Freq: Two times a day (BID) | ORAL | Status: DC

## 2015-10-30 NOTE — Progress Notes (Signed)
Physical Therapy Treatment Patient Details Name: [name] MRN: [id number] DOB: Jun 24, 1935 Today's Date: 10/30/2015    History of Present Illness [name] is a 81 y.o. female with ESRD, DM, HTN, PVD, CAD,multiple vascular access problems, hx solitary kidney due to prior nephrectomy for cancer on TTS dialysis in [place] is admitted following RCF endarterectomy and RLE bypass for critical limb ischemia. Pt s/p R fem-pop.     PT Comments    Motivated to work with therapy although making slow progress. Gait training up to 35 feet tolerated today. Tolerated exercises fairly well and reviewed RLE positioning to promote optimal healing. Patient will continue to benefit from skilled physical therapy services to further improve independence with functional mobility.   Follow Up Recommendations  SNF;Supervision/Assistance - 24 hour     Equipment Recommendations  None recommended by PT    Recommendations for Other Services       Precautions / Restrictions Precautions Precautions: Fall Restrictions Weight Bearing Restrictions: No    Mobility  Bed Mobility               General bed mobility comments: in recliner  Transfers Overall transfer level: Needs assistance Equipment used: Rolling walker (2 wheeled) Transfers: Sit to/from Stand Sit to Stand: Min assist         General transfer comment: Min assist for boost to stand from recliner. VC for hand placement  Ambulation/Gait Ambulation/Gait assistance: Min guard Ambulation Distance (Feet): 35 Feet Assistive device: Rolling walker (2 wheeled) Gait Pattern/deviations: Step-to pattern;Decreased step length - left;Decreased stance time - right;Decreased step length - right;Shuffle;Decreased stride length;Antalgic;Narrow base of support Gait velocity: very slow Gait velocity interpretation: <1.8 ft/sec, indicative of risk for recurrent falls General Gait Details: VC for larger step length, upright posture,  and forward gaze. Very slow. Encouraged to decrease UE support on RW. Moderately antalgic. fair balance with support from RW utilized throughout. Close guard for safety.   Stairs            Wheelchair Mobility    Modified Rankin (Stroke Patients Only)       Balance                                    Cognition Arousal/Alertness: Awake/alert Behavior During Therapy: WFL for tasks assessed/performed Overall Cognitive Status: Within Functional Limits for tasks assessed                      Exercises General Exercises - Lower Extremity Ankle Circles/Pumps: AROM;Both;10 reps;Seated Quad Sets: AROM;Both;5 reps;Seated Long Arc Quad: Strengthening;Right;5 reps;Seated    General Comments General comments (skin integrity, edema, etc.): Educated on knee extension positioning while resting to prevent contracture.      Pertinent Vitals/Pain Pain Assessment: 0-10 Pain Score: 5  Pain Location: RLE Pain Descriptors / Indicators: Aching Pain Intervention(s): Monitored during session;Repositioned    Home Living                      Prior Function            PT Goals (current goals can now be found in the care plan section) Acute Rehab PT Goals Patient Stated Goal: go to rehab before home PT Goal Formulation: With patient Time For Goal Achievement: 11/11/15 Potential to Achieve Goals: Good Progress towards PT goals: Progressing toward goals    Frequency  Min 3X/week  PT Plan Current plan remains appropriate    Co-evaluation             End of Session Equipment Utilized During Treatment: Gait belt Activity Tolerance: Patient tolerated treatment well Patient left: in chair;with call bell/phone within reach;with chair alarm set;with family/visitor present     Time: 1137-1203 PT Time Calculation (min) (ACUTE ONLY): 26 min  Charges:  $Gait Training: 8-22 mins $Therapeutic Activity: 8-22 mins                    G Codes:       [name] 04-Nov-2015, 12:49 PM  [name], [name]

## 2015-10-30 NOTE — Care Management Important Message (Signed)
Important Message  Patient Details  Name: [name] MRN: [id number] Date of Birth: [birth year]   Medicare Important Message Given:  Yes    Barb Merino [place] 10/30/2015, 4:15 PM

## 2015-10-30 NOTE — Progress Notes (Signed)
[facility] Progress Note  Assessment/Plan: 1. S/P RLE endarterectomy and bypass graft - per Dr. Scot Dock - holding heparin. Incisions well approximated, ecchymosis along suture lines.   2. ESRD - TTS - had HD yesterday. Back on schedule -next HD 10/31/15. K+4.0. No heparin.  3. Hypertension/volume - HD 03/02 Net UF 1400 Post wt 89.6. Still above OP EDW. Has some LE edema. Attempt 3 liters tomorrow as tolerated.  4. Anemia - no ESA for now, hd 10. 8 continue weekly Fe - ESA due for redose in 3 weeks. Follow Hgb  5. Metabolic bone disease - Ca 8.8 C Ca 10. Use 2.25 Ca bath tomorrow. Phos 3.9. Continue binders, sensipar. Follow Ca-may need to change to non-calcium binders.  6. Nutrition - Albumin 2.2. Add nepro/renal vits. Renal/Carb Mod diet. 7. DM - per primary. BS controlled.  8. CAD: per primary  Rita H. Brown NP-C 10/30/2015, 8:32 AM  [facility] [phone]  Subjective: "I feel pretty good". States she has been out of bed, confused about HD schedule. Assured her she is back on schedule. Denies chest pain/SOB. RLE tender.  Objective Filed Vitals:   10/29/15 1834 10/29/15 2047 10/30/15 0448 10/30/15 0756  BP: 100/78 102/43 122/43 122/42  Pulse: 84 81 71 80  Temp: 98.2 F (36.8 C) 99.9 F (37.7 C) 97.9 F (36.6 C) 98.4 F (36.9 C)  TempSrc: Oral   Oral  Resp: 18 20 22 18   Height:      Weight:  90.3 kg (199 lb 1.2 oz)    SpO2: 96% 97% 95% 92%   Physical Exam General: Well nourished, NAD Heart: S1, S2, RRR Lungs: Bilateral breath sounds CTA.  Abdomen: Soft nontender Extremities: Pre-tib edema BLE. Drsg R leg CDI. ecchymosis along suture lines. Dialysis Access: R perm cath drsg C/D/I.     Dialysis Orders: TueThuSat, 3 hrs 30 min, 160NRe Optiflux, BFR 400, DFR Autoflow 1.5, EDW 88 (kg), Dialysate 2.0 K, 3.5 Ca UFR Profile: None, Sodium Model: None, Access: Catheter-Tunneled Heparin: 4000 units per treatment Venofer: 50 mg IV weekly   Mircera: 50 mg IV q 4 weeks ( last dose 10/20/15)  Additional Objective Labs: Basic Metabolic Panel:  Recent Labs Lab 10/28/15 0900 10/28/15 2035 10/29/15 1433  NA 135 135 133*  K 5.3* 4.8 4.0  CL 102 95* 96*  CO2 21* 24 25  GLUCOSE 214* 170* 104*  BUN 55* 19 30*  CREATININE 6.37* 3.69* 5.11*  CALCIUM 7.9* 9.0 8.8*  PHOS 4.4 3.7 3.9   Liver Function Tests:  Recent Labs Lab 10/28/15 0900 10/28/15 2035 10/29/15 1433  ALBUMIN 2.3* 2.5* 2.5*   No results for input(s): LIPASE, AMYLASE in the last 168 hours. CBC:  Recent Labs Lab 10/27/15 1538 10/28/15 0900 10/28/15 2035 10/29/15 1433  WBC 14.3* 11.0* 11.0* 12.0*  HGB 11.4* 10.3* 11.8* 10.8*  HCT 37.0 34.1* 36.7 33.5*  MCV 93.2 94.7 93.1 93.6  PLT 254 250 PLATELET CLUMPS NOTED ON SMEAR, COUNT APPEARS ADEQUATE 261   Blood Culture    Component Value Date/Time   SDES URINE, CLEAN CATCH 06/05/2010 0210   SPECREQUEST IMMUNE:NORM UT SYMPT:NEG 06/05/2010 0210   CULT NO GROWTH 06/05/2010 0210   REPTSTATUS 06/06/2010 FINAL 06/05/2010 0210    Cardiac Enzymes: No results for input(s): CKTOTAL, CKMB, CKMBINDEX, TROPONINI in the last 168 hours. CBG:  Recent Labs Lab 10/28/15 2049 10/29/15 0814 10/29/15 1153 10/29/15 1814 10/29/15 2045  GLUCAP 148* 188* 138* 74 124*   Iron Studies: No results for  input(s): IRON, TIBC, TRANSFERRIN, FERRITIN in the last 72 hours. @lablastinr3 @ Studies/Results: No results found. Medications: . sodium chloride Stopped (10/30/15 0700)   . aspirin  325 mg Oral Daily  . calcitRIOL  1 mcg Oral Q T,Th,Sa-HD  . calcium acetate  1,334 mg Oral TID WC  . cinacalcet  30 mg Oral Q supper  . docusate sodium  100 mg Oral Daily  . enoxaparin (LOVENOX) injection  30 mg Subcutaneous Q24H  . fenofibrate  54 mg Oral Daily  . ferric gluconate (FERRLECIT/NULECIT) IV  62.5 mg Intravenous Q Thu-HD  . gabapentin  400 mg Oral TID  . insulin aspart  0-22 Units Subcutaneous TID WC  . levothyroxine   88 mcg Oral QAC breakfast  . pantoprazole  40 mg Oral Daily

## 2015-10-31 LAB — GLUCOSE, CAPILLARY
GLUCOSE-CAPILLARY: 170 mg/dL — AB (ref 65–99)
GLUCOSE-CAPILLARY: 217 mg/dL — AB (ref 65–99)
GLUCOSE-CAPILLARY: 219 mg/dL — AB (ref 65–99)
Glucose-Capillary: 76 mg/dL (ref 65–99)

## 2015-10-31 LAB — CBC
HCT: 31.6 % — ABNORMAL LOW (ref 36.0–46.0)
Hemoglobin: 10.1 g/dL — ABNORMAL LOW (ref 12.0–15.0)
MCH: 29.9 pg (ref 26.0–34.0)
MCHC: 32 g/dL (ref 30.0–36.0)
MCV: 93.5 fL (ref 78.0–100.0)
Platelets: 278 K/uL (ref 150–400)
RBC: 3.38 MIL/uL — ABNORMAL LOW (ref 3.87–5.11)
RDW: 14.7 % (ref 11.5–15.5)
WBC: 11.6 K/uL — ABNORMAL HIGH (ref 4.0–10.5)

## 2015-10-31 LAB — RENAL FUNCTION PANEL
Albumin: 2.1 g/dL — ABNORMAL LOW (ref 3.5–5.0)
Anion gap: 16 — ABNORMAL HIGH (ref 5–15)
BUN: 36 mg/dL — ABNORMAL HIGH (ref 6–20)
CO2: 22 mmol/L (ref 22–32)
Calcium: 8.5 mg/dL — ABNORMAL LOW (ref 8.9–10.3)
Chloride: 93 mmol/L — ABNORMAL LOW (ref 101–111)
Creatinine, Ser: 5.29 mg/dL — ABNORMAL HIGH (ref 0.44–1.00)
GFR calc Af Amer: 8 mL/min — ABNORMAL LOW (ref >60)
GFR calc non Af Amer: 7 mL/min — ABNORMAL LOW (ref >60)
Glucose, Bld: 158 mg/dL — ABNORMAL HIGH (ref 65–99)
Phosphorus: 4.8 mg/dL — ABNORMAL HIGH (ref 2.5–4.6)
Potassium: 4.6 mmol/L (ref 3.5–5.1)
Sodium: 131 mmol/L — ABNORMAL LOW (ref 135–145)

## 2015-10-31 MED ORDER — CALCITRIOL 0.5 MCG PO CAPS
ORAL_CAPSULE | ORAL | Status: AC
  Filled 2015-10-31: qty 2

## 2015-10-31 MED ORDER — SODIUM CHLORIDE 0.9 % IV SOLN: 100.0000 mL | INTRAVENOUS | Status: DC | PRN

## 2015-10-31 MED ORDER — HEPARIN SODIUM (PORCINE) 1000 UNIT/ML DIALYSIS: 1000.0000 [IU] | INTRAMUSCULAR | Status: DC | PRN

## 2015-10-31 MED ORDER — LIDOCAINE-PRILOCAINE 2.5-2.5 % EX CREA
1.0000 "application " | TOPICAL_CREAM | CUTANEOUS | Status: DC | PRN
  Filled 2015-10-31: qty 5

## 2015-10-31 MED ORDER — LIDOCAINE HCL (PF) 1 % IJ SOLN: 5.0000 mL | INTRAMUSCULAR | Status: DC | PRN

## 2015-10-31 MED ORDER — PENTAFLUOROPROP-TETRAFLUOROETH EX AERO: 1.0000 "application " | INHALATION_SPRAY | CUTANEOUS | Status: DC | PRN

## 2015-10-31 MED ORDER — ALTEPLASE 2 MG IJ SOLR: 2.0000 mg | Freq: Once | INTRAMUSCULAR | Status: DC | PRN

## 2015-10-31 NOTE — Progress Notes (Signed)
    Subjective  - POD #3, s/p: Right CFA endarterectomy with VPA and right fem BK pop bypass with PTFE. Graft is patent.   Complaining of right groin pain   Physical Exam:  There is some redness and tenderness in the right groin with moisture.  I placed a gauze in the right groin and have encouraged her to keep this area very dry.       Assessment/Plan:  POD #3  Keep right groin dry, meticulous wound care. Seen in dialysis today Plan to convert Ancef to Keflex on discharge.  Annamarie Major 10/31/2015 10:24 AM --  Filed Vitals:   10/31/15 0930 10/31/15 1000  BP: 124/55 106/68  Pulse: 82 84  Temp:    Resp:      Intake/Output Summary (Last 24 hours) at 10/31/15 1024 Last data filed at 10/30/15 1823  Gross per 24 hour  Intake    720 ml  Output      0 ml  Net    720 ml     Laboratory CBC    Component Value Date/Time   WBC 11.6* 10/31/2015 0926   HGB 10.1* 10/31/2015 0926   HCT 31.6* 10/31/2015 0926   PLT 278 10/31/2015 0926    BMET    Component Value Date/Time   NA 131* 10/31/2015 0926   K 4.6 10/31/2015 0926   CL 93* 10/31/2015 0926   CO2 22 10/31/2015 0926   GLUCOSE 158* 10/31/2015 0926   BUN 36* 10/31/2015 0926   CREATININE 5.29* 10/31/2015 0926   CALCIUM 8.5* 10/31/2015 0926   GFRNONAA 7* 10/31/2015 0926   GFRAA 8* 10/31/2015 0926    COAG Lab Results  Component Value Date   INR 1.08 10/21/2015   INR 0.93 04/18/2014   INR 0.94 01/09/2013   No results found for: PTT  Antibiotics Anti-infectives    Start     Dose/Rate Route Frequency Ordered Stop   10/28/15 1200  cefUROXime (ZINACEF) 1.5 g in dextrose 5 % 50 mL IVPB     1.5 g 100 mL/hr over 30 Minutes Intravenous  Once 10/28/15 1000 10/28/15 1242   10/28/15 0730  cefUROXime (ZINACEF) 1.5 g in dextrose 5 % 50 mL IVPB  Status:  Discontinued     1.5 g 100 mL/hr over 30 Minutes Intravenous  Once 10/27/15 1606 10/28/15 1000   10/27/15 1545  cefUROXime (ZINACEF) 1.5 g in dextrose 5 % 50 mL  IVPB  Status:  Discontinued     1.5 g 100 mL/hr over 30 Minutes Intravenous Every 12 hours 10/27/15 1538 10/27/15 1606   10/27/15 0700  cefUROXime (ZINACEF) 1.5 g in dextrose 5 % 50 mL IVPB     1.5 g 100 mL/hr over 30 Minutes Intravenous To ShortStay Surgical 10/26/15 1057 10/27/15 0757       V. Leia Alf, M.D. [facility] Office: [phone] Pager:  [phone]

## 2015-10-31 NOTE — Procedures (Signed)
I was present at this dialysis session. I have reviewed the session itself and made appropriate changes.   States Feels well.  No c/o.  ?SNF placement.  No new issues. Afebrile  Pearson Grippe  MD 10/31/2015, 7:55 AM

## 2015-11-01 LAB — GLUCOSE, CAPILLARY
GLUCOSE-CAPILLARY: 147 mg/dL — AB (ref 65–99)
GLUCOSE-CAPILLARY: 106 mg/dL — AB (ref 65–99)
GLUCOSE-CAPILLARY: 51 mg/dL — AB (ref 65–99)
GLUCOSE-CAPILLARY: 147 mg/dL — AB (ref 65–99)
Glucose-Capillary: 104 mg/dL — ABNORMAL HIGH (ref 65–99)

## 2015-11-01 MED ORDER — DEXTROSE 50 % IV SOLN
INTRAVENOUS | Status: AC
Start: 2015-11-01 — End: 2015-11-01
  Administered 2015-11-01: 50 mL
  Filled 2015-11-01: qty 50

## 2015-11-01 NOTE — Progress Notes (Signed)
    Subjective  - POD #4  Had a bowel movement yesterday and feels much better   Physical Exam:  Dry ischemic changes to right great toe without signs of infection Incisions remain intact Right groin with less moisture, remains tender      Assessment/Plan:  POD #4  Continue dry guaze to right groin Ambulate Abx  [name], [place] 11/01/2015 4:40 PM --  [name] Vitals:   10/31/15 2128 11/01/15 0428  BP: 102/34 122/37  Pulse: 58 74  Temp: 97.8 F (36.6 C) 97.7 F (36.5 C)  Resp: 19 18    Intake/Output Summary (Last 24 hours) at 11/01/15 1640 Last data filed at 11/01/15 1600  Gross per 24 hour  Intake    720 ml  Output      1 ml  Net    719 ml     Laboratory CBC    Component Value Date/Time   WBC 11.6* 10/31/2015 0926   HGB 10.1* 10/31/2015 0926   HCT 31.6* 10/31/2015 0926   PLT 278 10/31/2015 0926    BMET    Component Value Date/Time   NA 131* 10/31/2015 0926   K 4.6 10/31/2015 0926   CL 93* 10/31/2015 0926   CO2 22 10/31/2015 0926   GLUCOSE 158* 10/31/2015 0926   BUN 36* 10/31/2015 0926   CREATININE 5.29* 10/31/2015 0926   CALCIUM 8.5* 10/31/2015 0926   GFRNONAA 7* 10/31/2015 0926   GFRAA 8* 10/31/2015 0926    COAG Lab Results  Component Value Date   INR 1.08 10/21/2015   INR 0.93 04/18/2014   INR 0.94 01/09/2013   No results found for: PTT  Antibiotics Anti-infectives    Start     Dose/Rate Route Frequency Ordered Stop   10/28/15 1200  cefUROXime (ZINACEF) 1.5 g in dextrose 5 % 50 mL IVPB     1.5 g 100 mL/hr over 30 Minutes Intravenous  Once 10/28/15 1000 10/28/15 1242   10/28/15 0730  cefUROXime (ZINACEF) 1.5 g in dextrose 5 % 50 mL IVPB  Status:  Discontinued     1.5 g 100 mL/hr over 30 Minutes Intravenous  Once 10/27/15 1606 10/28/15 1000   10/27/15 1545  cefUROXime (ZINACEF) 1.5 g in dextrose 5 % 50 mL IVPB  Status:  Discontinued     1.5 g 100 mL/hr over 30 Minutes Intravenous Every 12 hours 10/27/15 1538 10/27/15 1606   10/27/15 0700  cefUROXime (ZINACEF) 1.5 g in dextrose 5 % 50 mL IVPB     1.5 g 100 mL/hr over 30 Minutes Intravenous To ShortStay Surgical 10/26/15 1057 10/27/15 0757       V. Leia Alf, M.D. [facility] [place] Office: [phone] Pager:  [phone]

## 2015-11-01 NOTE — Progress Notes (Signed)
[facility] Progress Note  Assessment/Plan: 1. S/P RLE endarterectomy and bypass graft - per Dr. Scot Dock - holding heparin. Incisions well approximated, ecchymosis along suture lines.   2. ESRD - TTS on schedule.   3. Hypertension/volume - should be able to approach outpt EDW of 88kg, need standing pre/post weights now that recovering 4. Anemia - no ESA for now, hd 10. 8 continue weekly Fe - ESA due for redose in 3 weeks. Follow Hgb  5. Metabolic bone disease - Ca 8.8 C Ca 10. Use 2.25 Ca bath tomorrow. Phos 3.9. Continue binders, sensipar. Follow Ca-may need to change to non-calcium binders.  6. Nutrition - Albumin 2.2. Add nepro/renal vits. Renal/Carb Mod diet. 7. DM - per primary. BS controlled.  8. CAD: per primary  Pearson Grippe MD [facility] [phone]  Subjective:  HD yesterday 3L UF via TDC No new events  Objective Filed Vitals:   10/31/15 1311 10/31/15 1659 10/31/15 2128 11/01/15 0428  BP: 99/74 113/50 102/34 122/37  Pulse: 91 73 58 74  Temp: 98.2 F (36.8 C) 98.2 F (36.8 C) 97.8 F (36.6 C) 97.7 F (36.5 C)  TempSrc: Oral Oral Oral Oral  Resp: 16 20 19 18   Height:      Weight:      SpO2: 94% 95% 95% 95%   Physical Exam General: Well nourished, NAD Heart: S1, S2, RRR Lungs: Bilateral breath sounds CTA.  Abdomen: Soft nontender Extremities: Pre-tib edema BLE. Drsg R leg CDI. ecchymosis along suture lines. Dialysis Access: R perm cath drsg C/D/I.     Dialysis Orders: TueThuSat, 3 hrs 30 min, 160NRe Optiflux, BFR 400, DFR Autoflow 1.5, EDW 88 (kg), Dialysate 2.0 K, 3.5 Ca UFR Profile: None, Sodium Model: None, Access: Catheter-Tunneled Heparin: 4000 units per treatment Venofer: 50 mg IV weekly  Mircera: 50 mg IV q 4 weeks ( last dose 10/20/15)  Additional Objective Labs: Basic Metabolic Panel:  Recent Labs Lab 10/28/15 2035 10/29/15 1433 10/31/15 0926  NA 135 133* 131*  K 4.8 4.0 4.6  CL 95* 96* 93*  CO2 24 25  22   GLUCOSE 170* 104* 158*  BUN 19 30* 36*  CREATININE 3.69* 5.11* 5.29*  CALCIUM 9.0 8.8* 8.5*  PHOS 3.7 3.9 4.8*   Liver Function Tests:  Recent Labs Lab 10/28/15 2035 10/29/15 1433 10/31/15 0926  ALBUMIN 2.5* 2.5* 2.1*   No results for input(s): LIPASE, AMYLASE in the last 168 hours. CBC:  Recent Labs Lab 10/27/15 1538 10/28/15 0900 10/28/15 2035 10/29/15 1433 10/31/15 0926  WBC 14.3* 11.0* 11.0* 12.0* 11.6*  HGB 11.4* 10.3* 11.8* 10.8* 10.1*  HCT 37.0 34.1* 36.7 33.5* 31.6*  MCV 93.2 94.7 93.1 93.6 93.5  PLT 254 250 PLATELET CLUMPS NOTED ON SMEAR, COUNT APPEARS ADEQUATE 261 278   Blood Culture    Component Value Date/Time   SDES URINE, CLEAN CATCH 06/05/2010 0210   SPECREQUEST IMMUNE:NORM UT SYMPT:NEG 06/05/2010 0210   CULT NO GROWTH 06/05/2010 0210   REPTSTATUS 06/06/2010 FINAL 06/05/2010 0210    Cardiac Enzymes: No results for input(s): CKTOTAL, CKMB, CKMBINDEX, TROPONINI in the last 168 hours. CBG:  Recent Labs Lab 10/31/15 1221 10/31/15 1310 10/31/15 1645 10/31/15 2128 11/01/15 0728  GLUCAP 170* 217* 219* 76 147*   Iron Studies: No results for input(s): IRON, TIBC, TRANSFERRIN, FERRITIN in the last 72 hours. @lablastinr3 @ Studies/Results: No results found. Medications:   . aspirin  325 mg Oral Daily  . calcitRIOL  1 mcg Oral Q T,Th,Sa-HD  . calcium  acetate  1,334 mg Oral TID WC  . cinacalcet  30 mg Oral Q supper  . docusate sodium  100 mg Oral Daily  . enoxaparin (LOVENOX) injection  30 mg Subcutaneous Q24H  . feeding supplement (NEPRO CARB STEADY)  237 mL Oral BID BM  . fenofibrate  54 mg Oral Daily  . ferric gluconate (FERRLECIT/NULECIT) IV  62.5 mg Intravenous Q Thu-HD  . gabapentin  400 mg Oral TID  . insulin aspart  0-22 Units Subcutaneous TID WC  . levothyroxine  88 mcg Oral QAC breakfast  . multivitamin  1 tablet Oral QHS  . pantoprazole  40 mg Oral Daily

## 2015-11-01 NOTE — Progress Notes (Signed)
Hypoglycemic Event  CBG: 43  Treatment: 15 GM carbohydrate snack  Symptoms: None  Follow-up CBG: Time:2115 CBG Result:51  Possible Reasons for Event: Inadequate meal intake  CBG: 51  Treatment: D50 IV 50 mL  Symptoms: None  Follow-up CBG: Time:2202 CBG Result:147  Possible Reasons for Event: Inadequate meal intake  Comments/MD notified:[place]   Najma Bozarth I

## 2015-11-02 LAB — GLUCOSE, CAPILLARY
GLUCOSE-CAPILLARY: 169 mg/dL — AB (ref 65–99)
GLUCOSE-CAPILLARY: 62 mg/dL — AB (ref 65–99)
GLUCOSE-CAPILLARY: 64 mg/dL — AB (ref 65–99)
GLUCOSE-CAPILLARY: 127 mg/dL — AB (ref 65–99)
Glucose-Capillary: 137 mg/dL — ABNORMAL HIGH (ref 65–99)
Glucose-Capillary: 85 mg/dL (ref 65–99)

## 2015-11-02 MED ORDER — CEPHALEXIN 500 MG PO CAPS
500.0000 mg | ORAL_CAPSULE | ORAL | Status: DC
  Administered 2015-11-02 – 2015-11-03 (×2): 500 mg via ORAL
  Filled 2015-11-02 (×2): qty 1

## 2015-11-02 MED ORDER — DARBEPOETIN ALFA 60 MCG/0.3ML IJ SOSY
60.0000 ug | PREFILLED_SYRINGE | INTRAMUSCULAR | Status: DC
  Administered 2015-11-03: 60 ug via INTRAVENOUS
  Filled 2015-11-02: qty 0.3

## 2015-11-02 NOTE — Progress Notes (Signed)
   VASCULAR SURGERY ASSESSMENT & PLAN:  * 6 Days Post-Op s/p: Right CFA endarterectomy with VPA and right fem BK pop bypass with PTFE. Graft is patent.   * RENAL: HD TTS  * ID: cellulitis- Ancef IV and convert to Keflex on D/C  * Meticulous wound care to right groin  * Continue PTx. Home when bed available.    SUBJECTIVE: Comfortable  PHYSICAL EXAM: Filed Vitals:   10/31/15 2128 11/01/15 0428 11/01/15 2035 11/02/15 0424  BP: 102/34 122/37 118/52 116/72  Pulse: 58 74 73 74  Temp: 97.8 F (36.6 C) 97.7 F (36.5 C) 98.2 F (36.8 C) 98.3 F (36.8 C)  TempSrc: Oral Oral Oral Oral  Resp: 19 18 20 19   Height:      Weight:   194 lb 7.1 oz (88.2 kg)   SpO2: 95% 95% 96% 97%   Right foot warm Dry gangrene right great toe unchanged Incisions ok  LABS: Lab Results  Component Value Date   WBC 11.6* 10/31/2015   HGB 10.1* 10/31/2015   HCT 31.6* 10/31/2015   MCV 93.5 10/31/2015   PLT 278 10/31/2015   Lab Results  Component Value Date   CREATININE 5.29* 10/31/2015   Lab Results  Component Value Date   INR 1.08 10/21/2015   CBG (last 3)   Recent Labs  11/01/15 1624 11/01/15 2115 11/01/15 2202  GLUCAP 104* 51* 147*    Active Problems:   PAD (peripheral artery disease) ([place])    Gae Gallop [id number] 11/02/2015

## 2015-11-02 NOTE — Progress Notes (Signed)
[facility] Progress Note  Assessment/Plan: 1. S/p right CFA endarterectomy with VPA and right fem pop bypass with cellulitis;  pt said not on atbx - discussed with pharm who will check with Dr. Scot Dock- plan start keflex 2. ESRD - TTS first round 3. Anemia - Hgb 10.1 - ESA due Tuesday - resume; continue weekly Fe 4. Secondary hyperparathyroidism - on added Ca - not sure she needs this - have increased calcitriol to 1- plan change to 2.5 Ca at d/c 5. HTN/volume - cannot stand without assistance - still has significant edema; need standing wt - may need edw lowered 6. Nutrition - diet + vit/supplements 7. [place], [place] [phone] 11/02/2015,10:20 AM  LOS: 6 days   Pt seen, examined and agree w A/P as above.  Kelly Splinter MD [facility] pager [phone]    cell [phone] 11/02/2015, 2:12 PM    Subjective:   Right groin hurts.  Notes worsening numbness in both hands since admission and right 1-3 fingers.   Objective Filed Vitals:   10/31/15 2128 11/01/15 0428 11/01/15 2035 11/02/15 0424  BP: 102/34 122/37 118/52 116/72  Pulse: 58 74 73 74  Temp: 97.8 F (36.6 C) 97.7 F (36.5 C) 98.2 F (36.8 C) 98.3 F (36.8 C)  TempSrc: Oral Oral Oral Oral  Resp: 19 18 20 19   Height:      Weight:   88.2 kg (194 lb 7.1 oz)   SpO2: 95% 95% 96% 97%   Physical Exam General: NAD Heart: RRR  Lungs: dim BS Abdomen: obese soft with panus - groin sore - gauze in place/tender to move Extremities: R > > L  LE edema - medial right leg wound healing well Dialysis Access: right IJ  Dialysis Orders:  TTS [place] 160 400/! 1.5 EDW 88 right IJ heparin 4000 2K 3.5 Ca Mircera 50 q 4 weeks - last dose 2/21, calcitriol 0.75 venofer 50 /week - last 2/23 Recent labs: Hgb 11.7 32% sat ferritin 763 iPTH 690- down a little, Ca/P ok; average gains +/- 2 L - gets to EDW   Additional Objective Labs: Basic Metabolic  Panel:  Recent Labs Lab 10/28/15 2035 10/29/15 1433 10/31/15 0926  NA 135 133* 131*  K 4.8 4.0 4.6  CL 95* 96* 93*  CO2 24 25 22   GLUCOSE 170* 104* 158*  BUN 19 30* 36*  CREATININE 3.69* 5.11* 5.29*  CALCIUM 9.0 8.8* 8.5*  PHOS 3.7 3.9 4.8*   Liver Function Tests:  Recent Labs Lab 10/28/15 2035 10/29/15 1433 10/31/15 0926  ALBUMIN 2.5* 2.5* 2.1*  CBC:  Recent Labs Lab 10/27/15 1538 10/28/15 0900 10/28/15 2035 10/29/15 1433 10/31/15 0926  WBC 14.3* 11.0* 11.0* 12.0* 11.6*  HGB 11.4* 10.3* 11.8* 10.8* 10.1*  HCT 37.0 34.1* 36.7 33.5* 31.6*  MCV 93.2 94.7 93.1 93.6 93.5  PLT 254 250 PLATELET CLUMPS NOTED ON SMEAR, COUNT APPEARS ADEQUATE 261 278   Blood Culture    Component Value Date/Time   SDES URINE, CLEAN CATCH 06/05/2010 0210   SPECREQUEST IMMUNE:NORM UT SYMPT:NEG 06/05/2010 0210   CULT NO GROWTH 06/05/2010 0210   REPTSTATUS 06/06/2010 FINAL 06/05/2010 0210   CBG:  Recent Labs Lab 11/01/15 1230 11/01/15 1624 11/01/15 2115 11/01/15 2202 11/02/15 0752  GLUCAP 106* 104* 51* 147* 137*  Medications:   . aspirin  325 mg Oral Daily  . calcitRIOL  1 mcg Oral Q T,Th,Sa-HD  . calcium acetate  1,334 mg Oral  TID WC  . cinacalcet  30 mg Oral Q supper  . docusate sodium  100 mg Oral Daily  . enoxaparin (LOVENOX) injection  30 mg Subcutaneous Q24H  . feeding supplement (NEPRO CARB STEADY)  237 mL Oral BID BM  . fenofibrate  54 mg Oral Daily  . ferric gluconate (FERRLECIT/NULECIT) IV  62.5 mg Intravenous Q Thu-HD  . gabapentin  400 mg Oral TID  . insulin aspart  0-22 Units Subcutaneous TID WC  . levothyroxine  88 mcg Oral QAC breakfast  . multivitamin  1 tablet Oral QHS  . pantoprazole  40 mg Oral Daily

## 2015-11-02 NOTE — Care Management Important Message (Signed)
Important Message  Patient Details  Name: CAMILL DIPRIMA MRN: [id number] Date of Birth: 1935/04/12   Medicare Important Message Given:  Yes    Barb Merino [place] 11/02/2015, 3:16 PM

## 2015-11-02 NOTE — Clinical Social Work Note (Signed)
Clinical Social Work Assessment  Patient Details  Name: [name] MRN: [id number] Date of Birth: [birth year]  Date of referral:  11/02/15               Reason for consult:  Facility Placement                Permission sought to share information with:  Chartered certified accountant granted to share information::  Yes, Verbal Permission Granted  Name::        Agency::  Skilled nursing facilities  Relationship::     Contact Information:     Housing/Transportation Living arrangements for the past 2 months:  Single Family Home Source of Information:  Patient Patient Interpreter Needed:  None Criminal Activity/Legal Involvement Pertinent to Current Situation/Hospitalization:  No - Comment as needed Significant Relationships:  Adult Children Lives with:    Do you feel safe going back to the place where you live?  No (Patient expressed understanding that ST rehab needed to assist her in being safer once home.) Need for family participation in patient care:  Yes (Comment)  Care giving concerns:  Patient explained that one of her sons lives around the corner from her but is not able to be with her 24/7 once discharged.    Social Worker assessment / plan:  CSW talked with patient at the bedside regarding discharge planning. Patient reported that she lives and her son [name] lives around the corner and checks on her. She expressed understanding that rehab will help her get stronger and is in agreement with going at discharge. She reported that due to the catheter in her neck she can't take showers right now and her left hand has no feeling and 3 fingers on her right hand lock up at times which at times affects her ability to do certain things at home.  [name] has 2 other children: a daughter who lives in [place], [name] and a son, [name] who lives in [place]. Patient explained that she and daughter are estranged and she provided CSW with a brief  synopsis  of their relationship since her daughter was a child. She added that they do not see each other unless something or some event is going on with the family. [name] reported that she talks to her son [name] on the phone but does not know the last time she saw him. Patient also reported that her husband and son [name] are deceased.   Employment status:  Retired Forensic scientist:  [employer]) PT Recommendations:  [name] / Referral to community resources:  [place]  Patient/Family's Response to care:  No concerns expressed by patient regarding care during hospitalization.  Patient/Family's Understanding of and Emotional Response to Diagnosis, Current Treatment, and Prognosis:  Not discussed.  Emotional Assessment Appearance:  Appears stated age Attitude/Demeanor/Rapport:  Other (Appropriate) Affect (typically observed):  Pleasant, Appropriate Orientation:  Oriented to Self, Oriented to Place, Oriented to  Time, Oriented to Situation Alcohol / Substance use:  Tobacco Use (Patient reported that she quit smoking 17 years ago and does not drink or use illicit drugs.) Psych involvement (Current and /or in the community):  No (Comment)  Discharge Needs  Concerns to be addressed:  Discharge Planning Concerns Readmission within the last 30 days:  Yes Current discharge risk:  None Barriers to Discharge:  No Barriers Identified   Linda Feil, LCSW 11/02/2015, 4:17 PM

## 2015-11-02 NOTE — Progress Notes (Signed)
Physical Therapy Treatment Patient Details Name: [name] MRN: [id number] DOB: 07-23-1935 Today's Date: 11/02/2015    History of Present Illness [name] is a 81 y.o. female with ESRD, DM, HTN, PVD, CAD,multiple vascular access problems, hx solitary kidney due to prior nephrectomy for cancer on TTS dialysis in [place] is admitted following RCF endarterectomy and RLE bypass for critical limb ischemia. Pt s/p R fem-pop.     PT Comments    Continuing progress with functional mobility and activity tolerance; Very motivated to improve despite pain; Overall progressing well; Anticipate continuing good progress at post-acute rehabilitation.   Follow Up Recommendations  SNF;Supervision/Assistance - 24 hour     Equipment Recommendations  None recommended by PT    Recommendations for Other Services       Precautions / Restrictions Precautions Precautions: Fall    Mobility  Bed Mobility Overal bed mobility: Needs Assistance Bed Mobility: Supine to Sit;Sit to Supine     Supine to sit: Mod assist Sit to supine: Mod assist   General bed mobility comments: Cues for technique and assist ot pull to sit and square off hips at EOB; asssit to bring both LEs back into bed  Transfers Overall transfer level: Needs assistance Equipment used: Rolling walker (2 wheeled) Transfers: Sit to/from Stand Sit to Stand: Min assist         General transfer comment: Min assist to boost to stand; cues for hand placement  Ambulation/Gait Ambulation/Gait assistance: Min guard Ambulation Distance (Feet): 40 Feet Assistive device: Rolling walker (2 wheeled) Gait Pattern/deviations: Step-to pattern;Decreased step length - left;Decreased weight shift to right;Antalgic Gait velocity: very slow   General Gait Details: Cues to self-monitor for activity tolerance; short steps very painful   Stairs            Wheelchair Mobility    Modified Rankin (Stroke Patients Only)        Balance                                    Cognition Arousal/Alertness: Awake/alert Behavior During Therapy: WFL for tasks assessed/performed Overall Cognitive Status: Within Functional Limits for tasks assessed                      Exercises General Exercises - Lower Extremity Ankle Circles/Pumps: AROM;Both;10 reps Quad Sets: AROM;Right;10 reps Gluteal Sets: AROM;Standing (2 reps) Short Arc Quad: AROM;Right;10 reps Heel Slides: AROM;Right;10 reps (limited range)    General Comments        Pertinent Vitals/Pain Pain Assessment: 0-10 Pain Score: 9  Pain Location: RLE and R lower abdomen Pain Descriptors / Indicators: Aching;Operative site guarding Pain Intervention(s): Monitored during session;Premedicated before session;Repositioned    Home Living                      Prior Function            PT Goals (current goals can now be found in the care plan section) Acute Rehab PT Goals Patient Stated Goal: go to rehab before home PT Goal Formulation: With patient Time For Goal Achievement: 11/11/15 Potential to Achieve Goals: Good Progress towards PT goals: Progressing toward goals    Frequency  Min 3X/week    PT Plan Current plan remains appropriate    Co-evaluation             End of Session Equipment Utilized During Treatment:  Gait belt Activity Tolerance: Patient tolerated treatment well Patient left: in bed;with call bell/phone within reach     Time: [id number] PT Time Calculation (min) (ACUTE ONLY): 29 min  Charges:  $Gait Training: 8-22 mins $Therapeutic Exercise: 8-22 mins                    G Codes:      Quin Hoop 11/02/2015, 4:19 PM  [place], [place] Pager [phone] Office [phone]

## 2015-11-02 NOTE — Clinical Social Work Placement (Signed)
   CLINICAL SOCIAL WORK PLACEMENT  NOTE  Date:  11/02/2015  Patient Details  Name: [name] MRN: [id number] Date of Birth: [birth year]  Clinical Social Work is seeking post-discharge placement for this patient at the [place] level of care (*CSW will initial, date and re-position this form in  chart as items are completed):  Yes   Patient/family provided with [place] Work Department's list of facilities offering this level of care within the geographic area requested by the patient (or if unable, by the patient's family).  Yes   Patient/family informed of their freedom to choose among providers that offer the needed level of care, that participate in Medicare, Medicaid or managed care program needed by the patient, have an available bed and are willing to accept the patient.  Yes   Patient/family informed of [facility]'s ownership interest in [facility] and [facility], as well as of the fact that they are under no obligation to receive care at these facilities.  PASRR submitted to EDS on 11/02/15     PASRR number received on 11/02/15     Existing PASRR number confirmed on       FL2 transmitted to all facilities in geographic area requested by pt/family on 11/02/15     FL2 transmitted to all facilities within larger geographic area on       Patient informed that his/her managed care company has contracts with or will negotiate with certain facilities, including the following:            Patient/family informed of bed offers received.  Patient chooses bed at       Physician recommends and patient chooses bed at      Patient to be transferred to   on  .  Patient to be transferred to facility by       Patient family notified on   of transfer.  Name of family member notified:        PHYSICIAN Please sign FL2     Additional Comment:    _______________________________________________ [name],  LCSW 11/02/2015, 5:00 PM

## 2015-11-02 NOTE — NC FL2 (Signed)
[place] MEDICAID FL2 LEVEL OF CARE SCREENING TOOL     IDENTIFICATION  Patient Name: [name] Birthdate: [birth year] Sex: female Admission Date (Current Location): 10/27/2015  [facility] and [place] Number:  Herbalist and Address:  [facility] [place], [place] [address] [place], [place] [address]      Provider Number: [id number]  Attending Physician Name and Address:  Angelia Mould, MD  Relative Name and Phone Number:       Current Level of Care: Hospital Recommended Level of Care: [place] Prior Approval Number:    Date Approved/Denied:   PASRR Number: [id number] (Eff. 11/02/15)   Discharge Plan: SNF    Current Diagnoses: Patient Active Problem List   Diagnosis Date Noted  . PAD (peripheral artery disease) ([place]) 10/27/2015  . Renal failure (ARF), acute on chronic (HCC) 03/01/2014  . DM type 2 (diabetes mellitus, type 2) ([place]) 03/01/2014  . HTN (hypertension) 03/01/2014  . CAD in native artery 03/01/2014  . Unspecified hypothyroidism 03/01/2014  . Other complications due to renal dialysis device, implant, and graft 07/17/2013  . AVF (arteriovenous fistula) ([place]) 02/26/2013  . Renal insufficiency 02/26/2013  . End stage renal disease ([place]) 01/08/2013  . Pre-operative cardiovascular examination 01/08/2013    Orientation RESPIRATION BLADDER Height & Weight     Self, Time, Situation, Place  Normal Continent Weight: 194 lb 7.1 oz (88.2 kg) Height:  5' (152.4 cm)  BEHAVIORAL SYMPTOMS/MOOD NEUROLOGICAL BOWEL NUTRITION STATUS      Continent Diet (Renal carb modified with fluid restriction)  AMBULATORY STATUS COMMUNICATION OF NEEDS Skin   Limited Assist Verbally Normal, Other (Comment) (Incision to right groin, right thigh, right leg and left arm.)                       Personal Care Assistance Level of Assistance  Bathing, Feeding, Dressing Bathing Assistance: Maximum assistance Feeding assistance:  Independent Dressing Assistance: Maximum assistance     Functional Limitations Info             SPECIAL CARE FACTORS FREQUENCY  PT (By licensed PT), OT (By licensed OT)     PT Frequency: Evaluation 3/1 - a minimum of 3X per week recommended. OT Frequency: Evaluation 3/2 - a minimum of 2X per week recommended            Contractures Contractures Info: Not present    Additional Factors Info  Code Status, Allergies Code Status Info: Full code Allergies Info: Macrobid, Ace Inhibitors, Angiotensin Receptor Blockers, Influenza vaccines           Current Medications (11/02/2015):  This is the current hospital active medication list Current Facility-Administered Medications  Medication Dose Route Frequency Provider Last Rate Last Dose  . acetaminophen (TYLENOL) tablet 1,000 mg  1,000 mg Oral TID PRN Ulyses Amor, PA-C   1,000 mg at 11/01/15 1148  . albuterol (PROVENTIL) (2.5 MG/3ML) 0.083% nebulizer solution 2.5 mg  2.5 mg Nebulization Q6H PRN Ulyses Amor, PA-C      . aspirin tablet 325 mg  325 mg Oral Daily Ulyses Amor, PA-C   325 mg at 11/02/15 0848  . bisacodyl (DULCOLAX) suppository 10 mg  10 mg Rectal Daily PRN Ulyses Amor, PA-C   10 mg at 11/01/15 0326  . calcitRIOL (ROCALTROL) capsule 1 mcg  1 mcg Oral Q T,Th,Sa-HD Alric Seton, PA-C   1 mcg at 10/31/15 1104  . calcium acetate (PHOSLO) capsule 1,334  mg  1,334 mg Oral TID WC Ulyses Amor, PA-C   1,334 mg at 11/02/15 1311  . cephALEXin (KEFLEX) capsule 500 mg  500 mg Oral Q24H Angelia Mould, MD      . cinacalcet [facility]) tablet 30 mg  30 mg Oral Q supper Ulyses Amor, PA-C   30 mg at 11/01/15 1733  . [START ON 11/03/2015] Darbepoetin Alfa (ARANESP) injection 60 mcg  60 mcg Intravenous Q Tue-HD Alric Seton, PA-C      . docusate sodium (COLACE) capsule 100 mg  100 mg Oral Daily Ulyses Amor, PA-C   100 mg at 11/02/15 0848  . enoxaparin (LOVENOX) injection 30 mg  30 mg Subcutaneous Q24H Ulyses Amor, PA-C   30 mg at 11/02/15 0849  . feeding supplement (NEPRO CARB STEADY) liquid 237 mL  237 mL Oral BID BM Valentina Gu, NP   237 mL at 10/30/15 1000  . fenofibrate tablet 54 mg  54 mg Oral Daily Ulyses Amor, PA-C   54 mg at 11/02/15 1311  . ferric gluconate (NULECIT) 62.5 mg in sodium chloride 0.9 % 100 mL IVPB  62.5 mg Intravenous Q Thu-HD Alric Seton, PA-C   62.5 mg at 10/29/15 2239  . gabapentin (NEURONTIN) capsule 400 mg  400 mg Oral TID Ulyses Amor, PA-C   400 mg at 11/02/15 0848  . guaiFENesin-dextromethorphan (ROBITUSSIN DM) 100-10 MG/5ML syrup 15 mL  15 mL Oral Q4H PRN Ulyses Amor, PA-C      . hydrALAZINE (APRESOLINE) injection 5 mg  5 mg Intravenous Q20 Min PRN Ulyses Amor, PA-C      . insulin aspart (novoLOG) injection 0-22 Units  0-22 Units Subcutaneous TID WC Ulyses Amor, PA-C   16 Units at 11/02/15 1311  . labetalol (NORMODYNE,TRANDATE) injection 10 mg  10 mg Intravenous Q10 min PRN Ulyses Amor, PA-C      . levothyroxine (SYNTHROID, LEVOTHROID) tablet 88 mcg  88 mcg Oral QAC breakfast Ulyses Amor, PA-C   88 mcg at 11/02/15 0848  . metoprolol (LOPRESSOR) injection 2-5 mg  2-5 mg Intravenous Q2H PRN Ulyses Amor, PA-C      . morphine 2 MG/ML injection 2-5 mg  2-5 mg Intravenous Q1H PRN Ulyses Amor, PA-C   2 mg at 10/31/15 1330  . multivitamin (RENA-VIT) tablet 1 tablet  1 tablet Oral QHS Valentina Gu, NP   1 tablet at 11/01/15 2122  . nitroGLYCERIN (NITROSTAT) SL tablet 0.4 mg  0.4 mg Sublingual Q5 min PRN Ulyses Amor, PA-C      . ondansetron [facility]) injection 4 mg  4 mg Intravenous Q6H PRN Ulyses Amor, PA-C   4 mg at 11/01/15 1337  . oxyCODONE-acetaminophen (PERCOCET/ROXICET) 5-325 MG per tablet 1-2 tablet  1-2 tablet Oral Q4H PRN Ulyses Amor, PA-C   2 tablet at 11/02/15 1438  . pantoprazole (PROTONIX) EC tablet 40 mg  40 mg Oral Daily Ulyses Amor, PA-C   40 mg at 11/02/15 0848  . phenol (CHLORASEPTIC) mouth spray 1 spray   1 spray Mouth/Throat PRN Ulyses Amor, PA-C      . senna-docusate (Senokot-S) tablet 1 tablet  1 tablet Oral QHS PRN Ulyses Amor, PA-C   1 tablet at 10/28/15 2132     Discharge Medications: Please see discharge summary for a list of discharge medications.  Relevant Imaging Results:  Relevant Lab Results:   Additional Information [phone].  Patient on  dialysis TTS at [facility]. Chair time 10:50 am.  [name], LCSW

## 2015-11-03 LAB — CBC
HCT: 32.1 % — ABNORMAL LOW (ref 36.0–46.0)
Hemoglobin: 10.5 g/dL — ABNORMAL LOW (ref 12.0–15.0)
MCH: 29.3 pg (ref 26.0–34.0)
MCHC: 32.7 g/dL (ref 30.0–36.0)
MCV: 89.7 fL (ref 78.0–100.0)
Platelets: 362 10*3/uL (ref 150–400)
RBC: 3.58 MIL/uL — ABNORMAL LOW (ref 3.87–5.11)
RDW: 14.3 % (ref 11.5–15.5)
WBC: 10.5 10*3/uL (ref 4.0–10.5)

## 2015-11-03 LAB — RENAL FUNCTION PANEL
Albumin: 2.1 g/dL — ABNORMAL LOW (ref 3.5–5.0)
Albumin: 2.1 g/dL — ABNORMAL LOW (ref 3.5–5.0)
Anion gap: 15 (ref 5–15)
Anion gap: 17 — ABNORMAL HIGH (ref 5–15)
BUN: 60 mg/dL — ABNORMAL HIGH (ref 6–20)
BUN: 59 mg/dL — ABNORMAL HIGH (ref 6–20)
CO2: 22 mmol/L (ref 22–32)
CO2: 21 mmol/L — ABNORMAL LOW (ref 22–32)
Calcium: 8.7 mg/dL — ABNORMAL LOW (ref 8.9–10.3)
Calcium: 8.6 mg/dL — ABNORMAL LOW (ref 8.9–10.3)
Chloride: 87 mmol/L — ABNORMAL LOW (ref 101–111)
Chloride: 86 mmol/L — ABNORMAL LOW (ref 101–111)
Creatinine, Ser: 5.9 mg/dL — ABNORMAL HIGH (ref 0.44–1.00)
Creatinine, Ser: 5.85 mg/dL — ABNORMAL HIGH (ref 0.44–1.00)
GFR calc Af Amer: 7 mL/min — ABNORMAL LOW (ref >60)
GFR calc Af Amer: 7 mL/min — ABNORMAL LOW (ref >60)
GFR calc non Af Amer: 6 mL/min — ABNORMAL LOW (ref >60)
GFR calc non Af Amer: 6 mL/min — ABNORMAL LOW (ref >60)
Glucose, Bld: 202 mg/dL — ABNORMAL HIGH (ref 65–99)
Glucose, Bld: 214 mg/dL — ABNORMAL HIGH (ref 65–99)
Phosphorus: 4.8 mg/dL — ABNORMAL HIGH (ref 2.5–4.6)
Phosphorus: 4.8 mg/dL — ABNORMAL HIGH (ref 2.5–4.6)
Potassium: 5 mmol/L (ref 3.5–5.1)
Potassium: 4.8 mmol/L (ref 3.5–5.1)
Sodium: 124 mmol/L — ABNORMAL LOW (ref 135–145)
Sodium: 124 mmol/L — ABNORMAL LOW (ref 135–145)

## 2015-11-03 LAB — GLUCOSE, CAPILLARY
GLUCOSE-CAPILLARY: 186 mg/dL — AB (ref 65–99)
GLUCOSE-CAPILLARY: 201 mg/dL — AB (ref 65–99)

## 2015-11-03 LAB — CREATININE, SERUM
CREATININE: 5.94 mg/dL — AB (ref 0.44–1.00)
GFR calc non Af Amer: 6 mL/min — ABNORMAL LOW (ref >60)
GFR, EST AFRICAN AMERICAN: 7 mL/min — AB (ref >60)

## 2015-11-03 MED ORDER — LIDOCAINE-PRILOCAINE 2.5-2.5 % EX CREA
1.0000 "application " | TOPICAL_CREAM | CUTANEOUS | Status: DC | PRN
  Filled 2015-11-03: qty 5

## 2015-11-03 MED ORDER — SODIUM CHLORIDE 0.9 % IV SOLN: 100.0000 mL | INTRAVENOUS | Status: DC | PRN

## 2015-11-03 MED ORDER — ALTEPLASE 2 MG IJ SOLR: 2.0000 mg | Freq: Once | INTRAMUSCULAR | Status: DC | PRN

## 2015-11-03 MED ORDER — CEPHALEXIN 500 MG PO CAPS: 500.0000 mg | ORAL_CAPSULE | ORAL | Status: DC

## 2015-11-03 MED ORDER — CALCITRIOL 0.5 MCG PO CAPS
ORAL_CAPSULE | ORAL | Status: AC
  Filled 2015-11-03: qty 2

## 2015-11-03 MED ORDER — LIDOCAINE HCL (PF) 1 % IJ SOLN: 5.0000 mL | INTRAMUSCULAR | Status: DC | PRN

## 2015-11-03 MED ORDER — HEPARIN SODIUM (PORCINE) 1000 UNIT/ML DIALYSIS: 1000.0000 [IU] | INTRAMUSCULAR | Status: DC | PRN

## 2015-11-03 MED ORDER — OXYCODONE-ACETAMINOPHEN 5-325 MG PO TABS: 1.0000 | ORAL_TABLET | ORAL | Status: DC | PRN

## 2015-11-03 MED ORDER — PENTAFLUOROPROP-TETRAFLUOROETH EX AERO: 1.0000 "application " | INHALATION_SPRAY | CUTANEOUS | Status: DC | PRN

## 2015-11-03 MED ORDER — DARBEPOETIN ALFA 60 MCG/0.3ML IJ SOSY
PREFILLED_SYRINGE | INTRAMUSCULAR | Status: AC
  Filled 2015-11-03: qty 0.3

## 2015-11-03 NOTE — Progress Notes (Signed)
[facility] Progress Note  Assessment/Plan: 1. S/p right CFA endarterectomy with VPA and right fem pop bypass with cellulitis; keflex resumed 3/6 - right groin painful/ worrisome high risk for poor healing due to immobilization and  Large pannus -  - defer to [facility] 2. ESRD - TTS K 5 3. Anemia - Hgb 10.5 stable - ESA due Tuesday - resume; continue weekly Fe 4. Secondary hyperparathyroidism - on added Ca prior to admission have increased calcitriol to 1 due to elevated iPTH- plan change to 2.25 Ca at d/c -correct Ca 10.2 today 5. HTN/volume - stood for HD wts- BP drop with HD goal 3.5  6. Nutrition - diet + vit/supplements 7. Disp - NHP 8. DM - BS variable  Myriam Jacobson, PA-C [place] [phone] 11/03/2015,9:15 AM  LOS: 7 days   Pt seen, examined and agree w A/P as above. Has SNF placement set up, possible dc.  Kelly Splinter MD [facility] pager [phone]    cell [phone] 11/03/2015, 1:24 PM    Subjective:   Right groin painful  Objective Filed Vitals:   11/03/15 0740 11/03/15 0802 11/03/15 0830 11/03/15 0900  BP: 147/61 102/89 107/57 107/50  Pulse: 67 70 79 78  Temp:      TempSrc:      Resp:      Height:      Weight:      SpO2:       Physical Exam General: uncomfortable with any movement of right pannus/groin Heart: RRR Lungs: grossly clear, dim BS Abdomen: obese - had to make pt completely supine to see right groin wound - tissue excoriated; brownish drainage-  Extremities: R> L LE edema right great toe dark/ foot perfused; LE wounds healing well Dialysis Access: right IJ  Dialysis Orders: TTS [facility] 160 400/! 1.5 EDW 88 right IJ heparin 4000 2K 3.5 Ca Mircera 50 q 4 weeks - last dose 2/21, calcitriol 0.75 venofer 50 /week - last 2/23 Recent labs: Hgb 11.7 32% sat ferritin 763 iPTH 690- down a little, Ca/P ok; average gains +/- 2 L - gets to EDW  Additional Objective Labs: Basic Metabolic Panel:  Recent  Labs Lab 10/31/15 0926 11/03/15 0705 11/03/15 0757  NA 131* 124* 124*  K 4.6 5.0 4.8  CL 93* 87* 86*  CO2 22 22 21*  GLUCOSE 158* 202* 214*  BUN 36* 60* 59*  CREATININE 5.29* 5.90*  5.94* 5.85*  CALCIUM 8.5* 8.7* 8.6*  PHOS 4.8* 4.8* 4.8*   Liver Function Tests:  Recent Labs Lab 10/31/15 0926 11/03/15 0705 11/03/15 0757  ALBUMIN 2.1* 2.1* 2.1*   CBC:  Recent Labs Lab 10/28/15 0900 10/28/15 2035 10/29/15 1433 10/31/15 0926 11/03/15 0705  WBC 11.0* 11.0* 12.0* 11.6* 10.5  HGB 10.3* 11.8* 10.8* 10.1* 10.5*  HCT 34.1* 36.7 33.5* 31.6* 32.1*  MCV 94.7 93.1 93.6 93.5 89.7  PLT 250 PLATELET CLUMPS NOTED ON SMEAR, COUNT APPEARS ADEQUATE 261 278 362  CBG:  Recent Labs Lab 11/02/15 1148 11/02/15 1630 11/02/15 1644 11/02/15 1736 11/02/15 2010  GLUCAP 169* 64* 62* 85 127*  Medications:   . aspirin  325 mg Oral Daily  . calcitRIOL  1 mcg Oral Q T,Th,Sa-HD  . calcium acetate  1,334 mg Oral TID WC  . cephALEXin  500 mg Oral Q24H  . cinacalcet  30 mg Oral Q supper  . Darbepoetin Alfa      . darbepoetin (ARANESP) injection - DIALYSIS  60 mcg Intravenous Q Tue-HD  .  docusate sodium  100 mg Oral Daily  . enoxaparin (LOVENOX) injection  30 mg Subcutaneous Q24H  . feeding supplement (NEPRO CARB STEADY)  237 mL Oral BID BM  . fenofibrate  54 mg Oral Daily  . ferric gluconate (FERRLECIT/NULECIT) IV  62.5 mg Intravenous Q Thu-HD  . gabapentin  400 mg Oral TID  . insulin aspart  0-22 Units Subcutaneous TID WC  . levothyroxine  88 mcg Oral QAC breakfast  . multivitamin  1 tablet Oral QHS  . pantoprazole  40 mg Oral Daily

## 2015-11-03 NOTE — Clinical Social Work Placement (Deleted)
   CLINICAL SOCIAL WORK PLACEMENT  NOTE 11/03/15 - DISCHARGED TO [facility]  Date:  11/03/2015  Patient Details  Name: [name] MRN: [id number] Date of Birth: [birth year]  Clinical Social Work is seeking post-discharge placement for this patient at the [place] level of care (*CSW will initial, date and re-position this form in  chart as items are completed):  Yes   Patient/family provided with [place] Work Department's list of facilities offering this level of care within the geographic area requested by the patient (or if unable, by the patient's family).  Yes   Patient/family informed of their freedom to choose among providers that offer the needed level of care, that participate in Medicare, Medicaid or managed care program needed by the patient, have an available bed and are willing to accept the patient.  Yes   Patient/family informed of [facility]'s ownership interest in [facility] and [facility] as well as of the fact that they are under no obligation to receive care at these facilities.  PASRR submitted to EDS on 11/02/15     PASRR number received on 11/02/15     Existing PASRR number confirmed on       FL2 transmitted to all facilities in geographic area requested by pt/family on 11/02/15     FL2 transmitted to all facilities within larger geographic area on       Patient informed that his/her managed care company has contracts with or will negotiate with certain facilities, including the following:         11/03/15 - Patient/family informed of bed offers received.  Patient chooses bed at  [facility]     Physician recommends and patient chooses bed at      Patient to be transferred to  [facility] on  11/03/15.  Patient to be transferred to facility by  ambulance     Patient family notified on  11/03/15 of transfer.  Name of family member notified:   Son Katlynd Mclaren [phone])     PHYSICIAN Please sign FL2     Additional Comment:    _______________________________________________ [name], LCSW 11/03/2015, 3:52 PM

## 2015-11-03 NOTE — Clinical Social Work Placement (Addendum)
   CLINICAL SOCIAL WORK PLACEMENT  NOTE 11/03/15 - DISCHARGED TO [facility]  Date:  11/03/2015  Patient Details  Name: [name] MRN: [id number] Date of Birth: [birth year]  Clinical Social Work is seeking post-discharge placement for this patient at the [place] level of care (*CSW will initial, date and re-position this form in  chart as items are completed):  Yes   Patient/family provided with [place] Work Department's list of facilities offering this level of care within the geographic area requested by the patient (or if unable, by the patient's family).  Yes   Patient/family informed of their freedom to choose among providers that offer the needed level of care, that participate in Medicare, Medicaid or managed care program needed by the patient, have an available bed and are willing to accept the patient.  Yes   Patient/family informed of [facility]'s ownership interest in [facility] and [facility], as well as of the fact that they are under no obligation to receive care at these facilities.  PASRR submitted to EDS on 11/02/15     PASRR number received on 11/02/15     Existing PASRR number confirmed on       FL2 transmitted to all facilities in geographic area requested by pt/family on 11/02/15     FL2 transmitted to all facilities within larger geographic area on       Patient informed that his/her managed care company has contracts with or will negotiate with certain facilities, including the following:         11/03/15 - Patient/family informed of bed offers received.  Patient chooses bed at  [facility]     Physician recommends and patient chooses bed at      Patient to be transferred to  [facility] on  11/03/15.  Patient to be transferred to facility by  ambulance     Patient family notified on  11/03/15 of transfer.  Name of family member notified:   Son Angelyne Conkling [phone].      PHYSICIAN Please sign FL2     Additional Comment:    _______________________________________________ [name], LCSW 11/03/2015, 1:54 PM

## 2015-11-03 NOTE — Progress Notes (Signed)
Report given to nurse at [facility].

## 2015-11-03 NOTE — Progress Notes (Signed)
   VASCULAR SURGERY ASSESSMENT & PLAN:  * 7 Days Post-Op s/p: Right CFA endarterectomy with VPA and right fem BK pop bypass with PTFE. Graft is patent.   * RENAL: On HD now.   * ID: cellulitis right foot is improved. Continue Keflex.  * Meticulous wound care to right groin  * Continue PTx. Home when bed available at SNF.  SUBJECTIVE: C/O right groin pain only.  PHYSICAL EXAM: Filed Vitals:   11/03/15 0830 11/03/15 0900 11/03/15 0930 11/03/15 1000  BP: 107/57 107/50 94/47 91/47   Pulse: 79 78 80 79  Temp:      TempSrc:      Resp:      Height:      Weight:      SpO2:       Right groin incision is intact. No erythema or drainage, however, because of her large pannus and morbid obesity, is at risk for problems. All of the other incisions look fine. Cellulitis right foot has improved. 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   LABS: Lab Results  Component Value Date   WBC 10.5 11/03/2015   HGB 10.5* 11/03/2015   HCT 32.1* 11/03/2015   MCV 89.7 11/03/2015   PLT 362 11/03/2015   Lab Results  Component Value Date   CREATININE 5.85* 11/03/2015   Lab Results  Component Value Date   INR 1.08 10/21/2015   CBG (last 3)   Recent Labs  11/02/15 1644 11/02/15 1736 11/02/15 2010  GLUCAP 62* 85 127*    Active Problems:   PAD (peripheral artery disease) ([place])    Gae Gallop [id number] 11/03/2015

## 2015-11-03 NOTE — Discharge Summary (Signed)
[facility] Discharge Summary   Patient ID:  [name] MRN: [id number] DOB/AGE: 02-22-1935 81 y.o.  Admit date: 10/27/2015 Discharge date: 11/03/2015 Date of Surgery: 10/27/2015 Surgeon: Surgeon(s): Angelia Mould, MD  Admission Diagnosis: Nonhealing wound right great toe   Discharge Diagnoses:  Nonhealing wound right great toe   Secondary Diagnoses: Past Medical History  Diagnosis Date  . Hypertension   . Heart failure [facility]) December 07, 2012    Right side  . Hypercalcemia     2nd to hyperparathyroidism  . Thyroid disease     Hyperparathyroidism  . Thyroid disease     Hypothyroidism  . Dyslipidemia   . CHF (congestive heart failure) ([place])   . Myocardial infarction ([place]) 10/28/2010  . Peripheral vascular disease ([place])   . Short-term memory loss   . Stroke ([place]) 2010ish    no residual  . Shingles 2010 ish  . Anginal pain ([employer])   . Coronary artery disease   . Diabetes mellitus without complication ([place])     TYPE 2  . Parathyroid cyst ([place])   . Headache(784.0)     CALCIFICATION LT SKULL  . Arthritis     Gout  . Cancer ([place])     kidney left removed  . Sleep apnea     Recently test in [place](NEG) NO CPAP   . ESRD (end stage renal disease) ([place])     dialysis T-TH-SAT  . Hypothyroidism   . Shortness of breath     with exertion     Procedure(s): BYPASS GRAFT FEMORAL-POPLITEAL ARTERY USING 6MM X 80CM GORE PROPATEN VASCULAR GRAFT ENDARTERECTOMY RIGHT COMMON FEMORAL ARTERY VEIN HARVEST RIGHT GREATER SAPHENOUS VEIN PATCH ANGIOPLASTY RIGHT COMMON FEMORAL ARTERY INTRA OPERATIVE ARTERIOGRAM RIGHT LOWER LEG  Discharged Condition: good  HPI: [name] is a 81 y.o. female, who has been a difficult access challenge. Her only option for a fistula in the right arm was a basilic vein transposition. She had a basilic vein transposition on the right but had significant steal symptoms and this had to be ligated. Subsequently, she had  access placed in the left arm and required placement of a left upper arm graft because of the very small size of her veins. She has had several interventions by [facility] and according to the patient ultimately required a stent in the outflow vein. When no further options were possible to salvage the left upper arm graft a right IJ tunneled dialysis catheter was placed and she was sent for evaluation for new access.  The situation is further complicated by a wound on her right great toe. She had a toenail trimmed and developed a wound on the right great toe. I do not get any clear-cut history of claudication although her activity I believe is fairly limited. She denies rest pain in her feet.   Hospital Course:  [name] is a 81 y.o. female is S/P  Procedure(s): BYPASS GRAFT FEMORAL-POPLITEAL ARTERY USING 6MM X 80CM GORE PROPATEN VASCULAR GRAFT ENDARTERECTOMY RIGHT COMMON FEMORAL ARTERY VEIN HARVEST RIGHT GREATER SAPHENOUS VEIN PATCH ANGIOPLASTY RIGHT COMMON FEMORAL ARTERY INTRA OPERATIVE ARTERIOGRAM RIGHT LOWER LEG  POD#1 Cellulitis on IV ancef, consult renal for HD needs, D/C JP POD#3 right groin increased pain, erythema.  Dry guaze placed and ordered to change daily.   POD# 7 Patient slowly improving.  Pending SNF placement.  Plan to discharge on Keflex and pain medication. Continue Keflex QD for 2 more weeks.   Continue mobility PT.  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  Dispositio  stable  Consults:  Treatment Team:  Rexene Agent, MD  Significant Diagnostic Studies: CBC Lab Results  Component Value Date   WBC 10.5 11/03/2015   HGB 10.5* 11/03/2015   HCT 32.1* 11/03/2015   MCV 89.7 11/03/2015   PLT 362 11/03/2015    BMET    Component Value Date/Time   NA 124* 11/03/2015 0757   K 4.8 11/03/2015 0757   CL 86* 11/03/2015 0757   CO2 21* 11/03/2015 0757   GLUCOSE 214* 11/03/2015 0757   BUN 59* 11/03/2015 0757   CREATININE 5.85* 11/03/2015 0757    CALCIUM 8.6* 11/03/2015 0757   GFRNONAA 6* 11/03/2015 0757   GFRAA 7* 11/03/2015 0757   COAG Lab Results  Component Value Date   INR 1.08 10/21/2015   INR 0.93 04/18/2014   INR 0.94 01/09/2013     Disposition:  Discharge to :Home    Medication List    ASK your doctor about these medications        acetaminophen 500 MG tablet  Commonly known as:  TYLENOL  Take 1,000 mg by mouth 3 (three) times daily as needed for mild pain.     albuterol (2.5 MG/3ML) 0.083% nebulizer solution  Commonly known as:  PROVENTIL  Take 2.5 mg by nebulization every 6 (six) hours as needed for wheezing or shortness of breath.     albuterol 108 (90 Base) MCG/ACT inhaler  Commonly known as:  PROVENTIL HFA;VENTOLIN HFA  Inhale 2 puffs into the lungs every 6 (six) hours as needed for wheezing or shortness of breath.     aspirin 325 MG tablet  Take 325 mg by mouth daily.     calcitRIOL 0.25 MCG capsule  Commonly known as:  ROCALTROL  Take 0.25 mcg by mouth 3 (three) times a week. Tuesday, Thursday, and Saturday.     calcium acetate 667 MG capsule  Commonly known as:  PHOSLO  Take 1,334 mg by mouth 3 (three) times daily with meals.     cephALEXin 250 MG capsule  Commonly known as:  KEFLEX  Take 1 capsule (250 mg total) by mouth 3 (three) times daily.     cinacalcet 30 MG tablet  Commonly known as:  SENSIPAR  Take 30 mg by mouth daily with supper.     fenofibrate 54 MG tablet  Take 54 mg by mouth daily.     Fish Oil 1000 MG Caps  Take 1,000 mg by mouth 2 (two) times daily.     folic acid-vitamin b complex-vitamin c-selenium-zinc 3 MG Tabs tablet  Take 1 tablet by mouth daily.     FOSRENOL 1000 MG chewable tablet  Generic drug:  lanthanum  Chew 1,000 mg by mouth 3 (three) times daily before meals.     gabapentin 400 MG capsule  Commonly known as:  NEURONTIN  Take 400 mg by mouth 3 (three) times daily.     insulin aspart 100 UNIT/ML injection  Commonly known as:  novoLOG  Inject  0-22 Units into the skin 3 (three) times daily with meals. CBG: 0-70= 0 units 71-100=9 units after meal 101-150=14 before meal 151-200=16 units before meal 201-250=17 units before meal 251-300=18 units before meal 301-350=19 units before meal 351-400=20 units before meal Over 400=22 units before meal     levothyroxine 88 MCG tablet  Commonly known as:  SYNTHROID, LEVOTHROID  Take 88 mcg by mouth daily before breakfast.     nitroGLYCERIN 0.4 MG  SL tablet  Commonly known as:  NITROSTAT  Place 0.4 mg under the tongue every 5 (five) minutes as needed for chest pain.       Verbal and written Discharge instructions given to the patient. Wound care per Discharge AVS   Signed: Laurence Slate [facility] 11/03/2015, 10:31 AM  - For VQI Registry use --- Instructions: Press F2 to tab through selections.  Delete question if not applicable.   Post-op:  Wound infection: No  Graft infection: No  Transfusion: No  If yes, 0 units given New Arrhythmia: No Ipsilateral amputation: [x ] no, [ ]  Minor, [ ]  BKA, [ ]  AKA Discharge patency: [ x] Primary, [ ]  Primary assisted, [ ]  Secondary, [ ]  Occluded Patency judged by: [x ] Dopper only, [ ]  Palpable graft pulse, [ ]  Palpable distal pulse, [ ]  ABI inc. > 0.15, [ ]  Duplex Discharge ABI: R 0.98-0.62, L non compressable arteries  D/C Ambulatory Status: Ambulatory  Complications: MI: [x ] No, [ ]  Troponin only, [ ]  EKG or Clinical CHF: No Resp failure: [ x] none, [ ]  Pneumonia, [ ]  Ventilator Chg in renal function: [ ]  none, [ ]  Inc. Cr > 0.5, [ ]  Temp. Dialysis, [x ] Permanent dialysis Stroke: [x ] None, [ ]  Minor, [ ]  Major Return to OR: No  Reason for return to OR: [ ]  Bleeding, [ ]  Infection, [ ]  Thrombosis, [ ]  Revision  Discharge medications: Statin use:  No  for medical reason   ASA use:  Yes Plavix use:  no Beta blocker use: No  for medical reason   Coumadin use: No  for medical reason

## 2015-11-05 ENCOUNTER — Telehealth: Payer: Self-pay | Admitting: Vascular Surgery

## 2015-11-05 NOTE — Telephone Encounter (Signed)
11-05-15 l/v/m notifying patient of post op appt.   bar °

## 2015-11-10 ENCOUNTER — Encounter (HOSPITAL_COMMUNITY): Payer: Self-pay

## 2015-11-10 ENCOUNTER — Emergency Department (HOSPITAL_COMMUNITY): Payer: Medicare Other

## 2015-11-10 ENCOUNTER — Emergency Department (HOSPITAL_COMMUNITY): Payer: Medicare Other | Admitting: Certified Registered"

## 2015-11-10 ENCOUNTER — Inpatient Hospital Stay (HOSPITAL_COMMUNITY): Payer: Medicare Other

## 2015-11-10 ENCOUNTER — Inpatient Hospital Stay (HOSPITAL_COMMUNITY)
Admission: EM | Admit: 2015-11-10 | Discharge: 2015-11-28 | DRG: 853 | Disposition: E | Payer: Medicare Other | Attending: Vascular Surgery | Admitting: Vascular Surgery

## 2015-11-10 ENCOUNTER — Encounter (HOSPITAL_COMMUNITY): Admission: EM | Disposition: E | Payer: Self-pay | Source: Home / Self Care | Attending: Vascular Surgery

## 2015-11-10 DIAGNOSIS — M109 Gout, unspecified: Secondary | ICD-10-CM | POA: Diagnosis present

## 2015-11-10 DIAGNOSIS — I252 Old myocardial infarction: Secondary | ICD-10-CM | POA: Diagnosis not present

## 2015-11-10 DIAGNOSIS — I132 Hypertensive heart and chronic kidney disease with heart failure and with stage 5 chronic kidney disease, or end stage renal disease: Secondary | ICD-10-CM | POA: Diagnosis present

## 2015-11-10 DIAGNOSIS — E1151 Type 2 diabetes mellitus with diabetic peripheral angiopathy without gangrene: Secondary | ICD-10-CM | POA: Diagnosis present

## 2015-11-10 DIAGNOSIS — Z7982 Long term (current) use of aspirin: Secondary | ICD-10-CM | POA: Diagnosis not present

## 2015-11-10 DIAGNOSIS — Z8249 Family history of ischemic heart disease and other diseases of the circulatory system: Secondary | ICD-10-CM | POA: Diagnosis not present

## 2015-11-10 DIAGNOSIS — Z794 Long term (current) use of insulin: Secondary | ICD-10-CM | POA: Diagnosis not present

## 2015-11-10 DIAGNOSIS — M726 Necrotizing fasciitis: Secondary | ICD-10-CM | POA: Diagnosis present

## 2015-11-10 DIAGNOSIS — A4153 Sepsis due to Serratia: Secondary | ICD-10-CM | POA: Diagnosis present

## 2015-11-10 DIAGNOSIS — Z955 Presence of coronary angioplasty implant and graft: Secondary | ICD-10-CM

## 2015-11-10 DIAGNOSIS — R0989 Other specified symptoms and signs involving the circulatory and respiratory systems: Secondary | ICD-10-CM

## 2015-11-10 DIAGNOSIS — D638 Anemia in other chronic diseases classified elsewhere: Secondary | ICD-10-CM | POA: Diagnosis present

## 2015-11-10 DIAGNOSIS — A480 Gas gangrene: Secondary | ICD-10-CM | POA: Diagnosis present

## 2015-11-10 DIAGNOSIS — Z8673 Personal history of transient ischemic attack (TIA), and cerebral infarction without residual deficits: Secondary | ICD-10-CM

## 2015-11-10 DIAGNOSIS — E43 Unspecified severe protein-calorie malnutrition: Secondary | ICD-10-CM | POA: Diagnosis present

## 2015-11-10 DIAGNOSIS — L02214 Cutaneous abscess of groin: Secondary | ICD-10-CM | POA: Diagnosis present

## 2015-11-10 DIAGNOSIS — Z79899 Other long term (current) drug therapy: Secondary | ICD-10-CM | POA: Diagnosis not present

## 2015-11-10 DIAGNOSIS — Z515 Encounter for palliative care: Secondary | ICD-10-CM | POA: Insufficient documentation

## 2015-11-10 DIAGNOSIS — N186 End stage renal disease: Secondary | ICD-10-CM | POA: Diagnosis present

## 2015-11-10 DIAGNOSIS — G4730 Sleep apnea, unspecified: Secondary | ICD-10-CM | POA: Diagnosis present

## 2015-11-10 DIAGNOSIS — M899 Disorder of bone, unspecified: Secondary | ICD-10-CM | POA: Diagnosis present

## 2015-11-10 DIAGNOSIS — E039 Hypothyroidism, unspecified: Secondary | ICD-10-CM | POA: Diagnosis present

## 2015-11-10 DIAGNOSIS — E1122 Type 2 diabetes mellitus with diabetic chronic kidney disease: Secondary | ICD-10-CM | POA: Diagnosis present

## 2015-11-10 DIAGNOSIS — L0889 Other specified local infections of the skin and subcutaneous tissue: Secondary | ICD-10-CM | POA: Diagnosis not present

## 2015-11-10 DIAGNOSIS — L8990 Pressure ulcer of unspecified site, unspecified stage: Secondary | ICD-10-CM | POA: Insufficient documentation

## 2015-11-10 DIAGNOSIS — I509 Heart failure, unspecified: Secondary | ICD-10-CM | POA: Diagnosis present

## 2015-11-10 DIAGNOSIS — Z90710 Acquired absence of both cervix and uterus: Secondary | ICD-10-CM | POA: Diagnosis not present

## 2015-11-10 DIAGNOSIS — Z87891 Personal history of nicotine dependence: Secondary | ICD-10-CM | POA: Diagnosis not present

## 2015-11-10 DIAGNOSIS — Z85528 Personal history of other malignant neoplasm of kidney: Secondary | ICD-10-CM | POA: Diagnosis not present

## 2015-11-10 DIAGNOSIS — N2581 Secondary hyperparathyroidism of renal origin: Secondary | ICD-10-CM | POA: Diagnosis present

## 2015-11-10 DIAGNOSIS — I2510 Atherosclerotic heart disease of native coronary artery without angina pectoris: Secondary | ICD-10-CM | POA: Diagnosis present

## 2015-11-10 DIAGNOSIS — L89302 Pressure ulcer of unspecified buttock, stage 2: Secondary | ICD-10-CM | POA: Diagnosis present

## 2015-11-10 DIAGNOSIS — E213 Hyperparathyroidism, unspecified: Secondary | ICD-10-CM | POA: Diagnosis present

## 2015-11-10 DIAGNOSIS — Z833 Family history of diabetes mellitus: Secondary | ICD-10-CM

## 2015-11-10 DIAGNOSIS — Z66 Do not resuscitate: Secondary | ICD-10-CM | POA: Diagnosis present

## 2015-11-10 DIAGNOSIS — E785 Hyperlipidemia, unspecified: Secondary | ICD-10-CM | POA: Diagnosis present

## 2015-11-10 DIAGNOSIS — I96 Gangrene, not elsewhere classified: Secondary | ICD-10-CM | POA: Diagnosis not present

## 2015-11-10 DIAGNOSIS — Z992 Dependence on renal dialysis: Secondary | ICD-10-CM

## 2015-11-10 DIAGNOSIS — B9689 Other specified bacterial agents as the cause of diseases classified elsewhere: Secondary | ICD-10-CM | POA: Diagnosis not present

## 2015-11-10 HISTORY — PX: FEMORAL-FEMORAL BYPASS GRAFT: SHX936

## 2015-11-10 LAB — COMPREHENSIVE METABOLIC PANEL
ALBUMIN: 1.9 g/dL — AB (ref 3.5–5.0)
ALT: 12 U/L — AB (ref 14–54)
AST: 17 U/L (ref 15–41)
Alkaline Phosphatase: 98 U/L (ref 38–126)
Anion gap: 20 — ABNORMAL HIGH (ref 5–15)
BILIRUBIN TOTAL: 0.8 mg/dL (ref 0.3–1.2)
BUN: 46 mg/dL — AB (ref 6–20)
CHLORIDE: 90 mmol/L — AB (ref 101–111)
CO2: 21 mmol/L — ABNORMAL LOW (ref 22–32)
CREATININE: 5.3 mg/dL — AB (ref 0.44–1.00)
Calcium: 9.5 mg/dL (ref 8.9–10.3)
GFR calc Af Amer: 8 mL/min — ABNORMAL LOW (ref >60)
GFR, EST NON AFRICAN AMERICAN: 7 mL/min — AB (ref >60)
GLUCOSE: 284 mg/dL — AB (ref 65–99)
POTASSIUM: 4.5 mmol/L (ref 3.5–5.1)
Sodium: 131 mmol/L — ABNORMAL LOW (ref 135–145)
Total Protein: 6.4 g/dL — ABNORMAL LOW (ref 6.5–8.1)

## 2015-11-10 LAB — RENAL FUNCTION PANEL
Albumin: 1.5 g/dL — ABNORMAL LOW (ref 3.5–5.0)
Anion gap: 14 (ref 5–15)
BUN: 52 mg/dL — ABNORMAL HIGH (ref 6–20)
CO2: 24 mmol/L (ref 22–32)
Calcium: 8.8 mg/dL — ABNORMAL LOW (ref 8.9–10.3)
Chloride: 94 mmol/L — ABNORMAL LOW (ref 101–111)
Creatinine, Ser: 5.14 mg/dL — ABNORMAL HIGH (ref 0.44–1.00)
GFR calc Af Amer: 8 mL/min — ABNORMAL LOW (ref >60)
GFR calc non Af Amer: 7 mL/min — ABNORMAL LOW (ref >60)
Glucose, Bld: 271 mg/dL — ABNORMAL HIGH (ref 65–99)
Phosphorus: 4 mg/dL (ref 2.5–4.6)
Potassium: 4.1 mmol/L (ref 3.5–5.1)
Sodium: 132 mmol/L — ABNORMAL LOW (ref 135–145)

## 2015-11-10 LAB — CBC
HCT: 27.3 % — ABNORMAL LOW (ref 36.0–46.0)
HEMATOCRIT: 28 % — AB (ref 36.0–46.0)
Hemoglobin: 8.4 g/dL — ABNORMAL LOW (ref 12.0–15.0)
Hemoglobin: 9.1 g/dL — ABNORMAL LOW (ref 12.0–15.0)
MCH: 27.6 pg (ref 26.0–34.0)
MCH: 29.2 pg (ref 26.0–34.0)
MCHC: 30.8 g/dL (ref 30.0–36.0)
MCHC: 32.5 g/dL (ref 30.0–36.0)
MCV: 89.8 fL (ref 78.0–100.0)
MCV: 89.7 fL (ref 78.0–100.0)
PLATELETS: 386 10*3/uL (ref 150–400)
Platelets: 406 10*3/uL — ABNORMAL HIGH (ref 150–400)
RBC: 3.04 MIL/uL — ABNORMAL LOW (ref 3.87–5.11)
RBC: 3.12 MIL/uL — ABNORMAL LOW (ref 3.87–5.11)
RDW: 14.4 % (ref 11.5–15.5)
RDW: 14.6 % (ref 11.5–15.5)
WBC: 26.6 10*3/uL — ABNORMAL HIGH (ref 4.0–10.5)
WBC: 27.4 10*3/uL — AB (ref 4.0–10.5)

## 2015-11-10 LAB — CBC WITH DIFFERENTIAL/PLATELET
BASOS PCT: 0 %
Basophils Absolute: 0 10*3/uL (ref 0.0–0.1)
EOS ABS: 0 10*3/uL (ref 0.0–0.7)
Eosinophils Relative: 0 %
HEMATOCRIT: 30.5 % — AB (ref 36.0–46.0)
Hemoglobin: 10 g/dL — ABNORMAL LOW (ref 12.0–15.0)
LYMPHS PCT: 2 %
Lymphs Abs: 0.6 10*3/uL — ABNORMAL LOW (ref 0.7–4.0)
MCH: 29.2 pg (ref 26.0–34.0)
MCHC: 32.8 g/dL (ref 30.0–36.0)
MCV: 89.2 fL (ref 78.0–100.0)
MONOS PCT: 5 %
Monocytes Absolute: 1.6 10*3/uL — ABNORMAL HIGH (ref 0.1–1.0)
NEUTROS ABS: 29.3 10*3/uL — AB (ref 1.7–7.7)
NEUTROS PCT: 93 %
PLATELETS: 441 10*3/uL — AB (ref 150–400)
RBC: 3.42 MIL/uL — ABNORMAL LOW (ref 3.87–5.11)
RDW: 14.5 % (ref 11.5–15.5)
WBC: 31.5 10*3/uL — ABNORMAL HIGH (ref 4.0–10.5)

## 2015-11-10 LAB — I-STAT CG4 LACTIC ACID, ED: LACTIC ACID, VENOUS: 1.14 mmol/L (ref 0.5–2.0)

## 2015-11-10 LAB — GLUCOSE, CAPILLARY
GLUCOSE-CAPILLARY: 328 mg/dL — AB (ref 65–99)
GLUCOSE-CAPILLARY: 299 mg/dL — AB (ref 65–99)
GLUCOSE-CAPILLARY: 170 mg/dL — AB (ref 65–99)
Glucose-Capillary: 102 mg/dL — ABNORMAL HIGH (ref 65–99)

## 2015-11-10 LAB — CREATININE, SERUM
Creatinine, Ser: 2.69 mg/dL — ABNORMAL HIGH (ref 0.44–1.00)
GFR calc Af Amer: 18 mL/min — ABNORMAL LOW (ref >60)
GFR calc non Af Amer: 16 mL/min — ABNORMAL LOW (ref >60)

## 2015-11-10 SURGERY — CREATION, BYPASS, ARTERIAL, FEMORAL TO FEMORAL, USING GRAFT
Anesthesia: General | Site: Groin | Laterality: Right

## 2015-11-10 MED ORDER — SUFENTANIL CITRATE 50 MCG/ML IV SOLN
INTRAVENOUS | Status: DC | PRN
  Administered 2015-11-10: 5 ug via INTRAVENOUS
  Administered 2015-11-10: 20 ug via INTRAVENOUS

## 2015-11-10 MED ORDER — MORPHINE SULFATE (PF) 2 MG/ML IV SOLN
INTRAVENOUS | Status: AC
  Filled 2015-11-10: qty 1

## 2015-11-10 MED ORDER — SUFENTANIL CITRATE 50 MCG/ML IV SOLN
INTRAVENOUS | Status: AC
  Filled 2015-11-10: qty 1

## 2015-11-10 MED ORDER — PROPOFOL 10 MG/ML IV BOLUS
INTRAVENOUS | Status: AC
  Filled 2015-11-10: qty 20

## 2015-11-10 MED ORDER — CALCITRIOL 0.5 MCG PO CAPS
1.0000 ug | ORAL_CAPSULE | ORAL | Status: DC
  Administered 2015-11-10 – 2015-11-12 (×2): 1 ug via ORAL
  Filled 2015-11-10 (×2): qty 2

## 2015-11-10 MED ORDER — FENOFIBRATE 54 MG PO TABS
54.0000 mg | ORAL_TABLET | Freq: Every day | ORAL | Status: DC
  Administered 2015-11-12: 54 mg via ORAL
  Filled 2015-11-10 (×16): qty 1

## 2015-11-10 MED ORDER — OXYCODONE-ACETAMINOPHEN 5-325 MG PO TABS
1.0000 | ORAL_TABLET | ORAL | Status: DC | PRN
  Administered 2015-11-10 – 2015-11-11 (×5): 2 via ORAL
  Administered 2015-11-12: 1 via ORAL
  Administered 2015-11-12: 2 via ORAL
  Administered 2015-11-13 (×2): 1 via ORAL
  Filled 2015-11-10 (×6): qty 2
  Filled 2015-11-10: qty 1
  Filled 2015-11-10: qty 2
  Filled 2015-11-10 (×2): qty 1

## 2015-11-10 MED ORDER — MIDAZOLAM HCL 5 MG/5ML IJ SOLN
INTRAMUSCULAR | Status: DC | PRN
Start: 2015-11-10 — End: 2015-11-10
  Administered 2015-11-10: 2 mg via INTRAVENOUS

## 2015-11-10 MED ORDER — SODIUM CHLORIDE 0.9 % IV SOLN
INTRAVENOUS | Status: DC | PRN
  Administered 2015-11-10: 06:00:00 via INTRAVENOUS

## 2015-11-10 MED ORDER — 0.9 % SODIUM CHLORIDE (POUR BTL) OPTIME
TOPICAL | Status: DC | PRN
  Administered 2015-11-10: 2000 mL

## 2015-11-10 MED ORDER — HEPARIN SODIUM (PORCINE) 1000 UNIT/ML DIALYSIS: 1000.0000 [IU] | INTRAMUSCULAR | Status: DC | PRN

## 2015-11-10 MED ORDER — HYDRALAZINE HCL 20 MG/ML IJ SOLN: 5.0000 mg | INTRAMUSCULAR | Status: DC | PRN

## 2015-11-10 MED ORDER — MORPHINE SULFATE (PF) 2 MG/ML IV SOLN
2.0000 mg | INTRAVENOUS | Status: DC | PRN
  Administered 2015-11-10 (×5): 2 mg via INTRAVENOUS
  Administered 2015-11-11: 3 mg via INTRAVENOUS
  Administered 2015-11-11 – 2015-11-17 (×4): 2 mg via INTRAVENOUS
  Filled 2015-11-10 (×8): qty 1

## 2015-11-10 MED ORDER — SODIUM CHLORIDE 0.9 % IV SOLN: 100.0000 mL | INTRAVENOUS | Status: DC | PRN

## 2015-11-10 MED ORDER — DOCUSATE SODIUM 100 MG PO CAPS
100.0000 mg | ORAL_CAPSULE | Freq: Two times a day (BID) | ORAL | Status: DC
  Administered 2015-11-10 – 2015-11-12 (×5): 100 mg via ORAL
  Filled 2015-11-10 (×10): qty 1

## 2015-11-10 MED ORDER — ALBUTEROL SULFATE (2.5 MG/3ML) 0.083% IN NEBU: 2.5000 mg | INHALATION_SOLUTION | Freq: Four times a day (QID) | RESPIRATORY_TRACT | Status: DC | PRN

## 2015-11-10 MED ORDER — ALBUTEROL SULFATE HFA 108 (90 BASE) MCG/ACT IN AERS: 2.0000 | INHALATION_SPRAY | Freq: Four times a day (QID) | RESPIRATORY_TRACT | Status: DC | PRN

## 2015-11-10 MED ORDER — PIPERACILLIN-TAZOBACTAM IN DEX 2-0.25 GM/50ML IV SOLN
2.2500 g | Freq: Three times a day (TID) | INTRAVENOUS | Status: DC
  Administered 2015-11-10 – 2015-11-14 (×10): 2.25 g via INTRAVENOUS
  Filled 2015-11-10 (×16): qty 50

## 2015-11-10 MED ORDER — ONDANSETRON HCL 4 MG/2ML IJ SOLN
4.0000 mg | Freq: Once | INTRAMUSCULAR | Status: AC
  Administered 2015-11-10: 4 mg via INTRAVENOUS
  Filled 2015-11-10: qty 2

## 2015-11-10 MED ORDER — MIDAZOLAM HCL 2 MG/2ML IJ SOLN
INTRAMUSCULAR | Status: AC
  Filled 2015-11-10: qty 2

## 2015-11-10 MED ORDER — LEVOTHYROXINE SODIUM 88 MCG PO TABS
88.0000 ug | ORAL_TABLET | Freq: Every day | ORAL | Status: DC
  Administered 2015-11-11 – 2015-11-15 (×5): 88 ug via ORAL
  Filled 2015-11-10 (×5): qty 1

## 2015-11-10 MED ORDER — MORPHINE SULFATE (PF) 2 MG/ML IV SOLN
INTRAVENOUS | Status: AC
  Administered 2015-11-10: 2 mg via INTRAVENOUS
  Filled 2015-11-10: qty 1

## 2015-11-10 MED ORDER — ACETAMINOPHEN 500 MG PO TABS
1000.0000 mg | ORAL_TABLET | Freq: Three times a day (TID) | ORAL | Status: DC | PRN
  Administered 2015-11-12: 1000 mg via ORAL
  Filled 2015-11-10: qty 2

## 2015-11-10 MED ORDER — SODIUM CHLORIDE 0.9 % IV SOLN: 500.0000 mL | Freq: Once | INTRAVENOUS | Status: DC | PRN

## 2015-11-10 MED ORDER — CINACALCET HCL 30 MG PO TABS
30.0000 mg | ORAL_TABLET | Freq: Every day | ORAL | Status: DC
  Administered 2015-11-10 – 2015-11-11 (×2): 30 mg via ORAL
  Filled 2015-11-10 (×2): qty 1

## 2015-11-10 MED ORDER — POTASSIUM CHLORIDE CRYS ER 20 MEQ PO TBCR: 20.0000 meq | EXTENDED_RELEASE_TABLET | Freq: Every day | ORAL | Status: DC | PRN

## 2015-11-10 MED ORDER — INSULIN ASPART 100 UNIT/ML ~~LOC~~ SOLN
10.0000 [IU] | Freq: Once | SUBCUTANEOUS | Status: AC
  Administered 2015-11-10: 10 [IU] via SUBCUTANEOUS

## 2015-11-10 MED ORDER — VANCOMYCIN HCL IN DEXTROSE 1-5 GM/200ML-% IV SOLN
1000.0000 mg | INTRAVENOUS | Status: AC
  Administered 2015-11-10: 1000 mg via INTRAVENOUS
  Filled 2015-11-10 (×2): qty 200

## 2015-11-10 MED ORDER — GUAIFENESIN-DM 100-10 MG/5ML PO SYRP: 15.0000 mL | ORAL_SOLUTION | ORAL | Status: DC | PRN

## 2015-11-10 MED ORDER — LANTHANUM CARBONATE 500 MG PO CHEW: 1000.0000 mg | CHEWABLE_TABLET | Freq: Three times a day (TID) | ORAL | Status: DC

## 2015-11-10 MED ORDER — ALUM & MAG HYDROXIDE-SIMETH 200-200-20 MG/5ML PO SUSP: 15.0000 mL | ORAL | Status: DC | PRN

## 2015-11-10 MED ORDER — ALTEPLASE 2 MG IJ SOLR
2.0000 mg | Freq: Once | INTRAMUSCULAR | Status: DC | PRN
  Filled 2015-11-10: qty 2

## 2015-11-10 MED ORDER — INSULIN ASPART 100 UNIT/ML ~~LOC~~ SOLN
0.0000 [IU] | Freq: Three times a day (TID) | SUBCUTANEOUS | Status: DC
  Administered 2015-11-11: 17 [IU] via SUBCUTANEOUS
  Administered 2015-11-11: 14 [IU] via SUBCUTANEOUS
  Administered 2015-11-11: 11 [IU] via SUBCUTANEOUS

## 2015-11-10 MED ORDER — HYDROMORPHONE HCL 1 MG/ML IJ SOLN
INTRAMUSCULAR | Status: AC
  Filled 2015-11-10: qty 1

## 2015-11-10 MED ORDER — PANTOPRAZOLE SODIUM 40 MG PO TBEC
40.0000 mg | DELAYED_RELEASE_TABLET | Freq: Every day | ORAL | Status: DC
  Administered 2015-11-10 – 2015-11-12 (×3): 40 mg via ORAL
  Filled 2015-11-10 (×5): qty 1

## 2015-11-10 MED ORDER — FLUCONAZOLE 100 MG PO TABS
100.0000 mg | ORAL_TABLET | ORAL | Status: AC
Start: 2015-11-11 — End: 2015-11-15
  Administered 2015-11-11: 100 mg via ORAL
  Filled 2015-11-10: qty 1

## 2015-11-10 MED ORDER — LIDOCAINE HCL (PF) 1 % IJ SOLN: 5.0000 mL | INTRAMUSCULAR | Status: DC | PRN

## 2015-11-10 MED ORDER — LIDOCAINE-PRILOCAINE 2.5-2.5 % EX CREA
1.0000 "application " | TOPICAL_CREAM | CUTANEOUS | Status: DC | PRN
  Filled 2015-11-10: qty 5

## 2015-11-10 MED ORDER — NITROGLYCERIN 0.4 MG SL SUBL: 0.4000 mg | SUBLINGUAL_TABLET | SUBLINGUAL | Status: DC | PRN

## 2015-11-10 MED ORDER — ASPIRIN 325 MG PO TABS
325.0000 mg | ORAL_TABLET | Freq: Every day | ORAL | Status: DC
  Administered 2015-11-10 – 2015-11-12 (×3): 325 mg via ORAL
  Filled 2015-11-10 (×5): qty 1

## 2015-11-10 MED ORDER — PENTAFLUOROPROP-TETRAFLUOROETH EX AERO: 1.0000 "application " | INHALATION_SPRAY | CUTANEOUS | Status: DC | PRN

## 2015-11-10 MED ORDER — PHENOL 1.4 % MT LIQD: 1.0000 | OROMUCOSAL | Status: DC | PRN

## 2015-11-10 MED ORDER — PRO-STAT SUGAR FREE PO LIQD
30.0000 mL | Freq: Two times a day (BID) | ORAL | Status: DC
  Administered 2015-11-10 – 2015-11-12 (×3): 30 mL via ORAL
  Filled 2015-11-10 (×7): qty 30

## 2015-11-10 MED ORDER — INSULIN ASPART 100 UNIT/ML ~~LOC~~ SOLN
SUBCUTANEOUS | Status: AC
  Filled 2015-11-10: qty 10

## 2015-11-10 MED ORDER — LANTHANUM CARBONATE 500 MG PO CHEW
1000.0000 mg | CHEWABLE_TABLET | Freq: Three times a day (TID) | ORAL | Status: DC
  Administered 2015-11-10 – 2015-11-12 (×3): 1000 mg via ORAL
  Filled 2015-11-10 (×7): qty 2

## 2015-11-10 MED ORDER — INSULIN ASPART 100 UNIT/ML ~~LOC~~ SOLN: 18.0000 [IU] | Freq: Once | SUBCUTANEOUS | Status: DC

## 2015-11-10 MED ORDER — VANCOMYCIN HCL IN DEXTROSE 1-5 GM/200ML-% IV SOLN
1000.0000 mg | INTRAVENOUS | Status: DC
  Administered 2015-11-12: 1000 mg via INTRAVENOUS
  Filled 2015-11-10 (×2): qty 200

## 2015-11-10 MED ORDER — HEPARIN SODIUM (PORCINE) 5000 UNIT/ML IJ SOLN
INTRAMUSCULAR | Status: DC | PRN
  Administered 2015-11-10: 07:00:00

## 2015-11-10 MED ORDER — VANCOMYCIN HCL IN DEXTROSE 1-5 GM/200ML-% IV SOLN
1000.0000 mg | Freq: Once | INTRAVENOUS | Status: AC
  Administered 2015-11-10: 1000 mg via INTRAVENOUS
  Filled 2015-11-10: qty 200

## 2015-11-10 MED ORDER — ETOMIDATE 2 MG/ML IV SOLN
INTRAVENOUS | Status: DC | PRN
  Administered 2015-11-10: 20 mg via INTRAVENOUS

## 2015-11-10 MED ORDER — CALCIUM ACETATE (PHOS BINDER) 667 MG PO CAPS: 1334.0000 mg | ORAL_CAPSULE | Freq: Three times a day (TID) | ORAL | Status: DC

## 2015-11-10 MED ORDER — ONDANSETRON HCL 4 MG/2ML IJ SOLN: 4.0000 mg | Freq: Four times a day (QID) | INTRAMUSCULAR | Status: DC | PRN

## 2015-11-10 MED ORDER — IOHEXOL 300 MG/ML  SOLN
80.0000 mL | Freq: Once | INTRAMUSCULAR | Status: AC | PRN
  Administered 2015-11-10: 80 mL via INTRAVENOUS

## 2015-11-10 MED ORDER — LABETALOL HCL 5 MG/ML IV SOLN: 10.0000 mg | INTRAVENOUS | Status: DC | PRN

## 2015-11-10 MED ORDER — INSULIN ASPART 100 UNIT/ML ~~LOC~~ SOLN
SUBCUTANEOUS | Status: AC
  Filled 2015-11-10: qty 18

## 2015-11-10 MED ORDER — PIPERACILLIN-TAZOBACTAM 3.375 G IVPB 30 MIN
3.3750 g | Freq: Once | INTRAVENOUS | Status: AC
  Administered 2015-11-10: 3.375 g via INTRAVENOUS
  Filled 2015-11-10: qty 50

## 2015-11-10 MED ORDER — CALCIUM ACETATE (PHOS BINDER) 667 MG PO CAPS
1334.0000 mg | ORAL_CAPSULE | Freq: Three times a day (TID) | ORAL | Status: DC
Start: 2015-11-10 — End: 2015-11-17
  Administered 2015-11-10 – 2015-11-12 (×5): 1334 mg via ORAL
  Filled 2015-11-10 (×7): qty 2

## 2015-11-10 MED ORDER — HEPARIN SODIUM (PORCINE) 5000 UNIT/ML IJ SOLN
5000.0000 [IU] | Freq: Three times a day (TID) | INTRAMUSCULAR | Status: DC
  Administered 2015-11-10 – 2015-11-16 (×14): 5000 [IU] via SUBCUTANEOUS
  Filled 2015-11-10 (×15): qty 1

## 2015-11-10 MED ORDER — ONDANSETRON HCL 4 MG/2ML IJ SOLN
INTRAMUSCULAR | Status: DC | PRN
Start: 2015-11-10 — End: 2015-11-10
  Administered 2015-11-10: 4 mg via INTRAVENOUS

## 2015-11-10 MED ORDER — RENA-VITE PO TABS
1.0000 | ORAL_TABLET | Freq: Every day | ORAL | Status: DC
  Administered 2015-11-10 – 2015-11-12 (×3): 1 via ORAL
  Filled 2015-11-10 (×5): qty 1

## 2015-11-10 MED ORDER — HYDROMORPHONE HCL 1 MG/ML IJ SOLN
0.2500 mg | INTRAMUSCULAR | Status: DC | PRN
  Administered 2015-11-10 (×4): 0.5 mg via INTRAVENOUS

## 2015-11-10 MED ORDER — INSULIN ASPART 100 UNIT/ML ~~LOC~~ SOLN
18.0000 [IU] | Freq: Once | SUBCUTANEOUS | Status: AC
  Administered 2015-11-10: 18 [IU] via SUBCUTANEOUS

## 2015-11-10 MED ORDER — METOPROLOL TARTRATE 1 MG/ML IV SOLN: 2.0000 mg | INTRAVENOUS | Status: DC | PRN

## 2015-11-10 MED ORDER — LIDOCAINE HCL (CARDIAC) 20 MG/ML IV SOLN
INTRAVENOUS | Status: DC | PRN
  Administered 2015-11-10: 100 mg via INTRAVENOUS

## 2015-11-10 MED ORDER — MORPHINE SULFATE (PF) 2 MG/ML IV SOLN
INTRAVENOUS | Status: AC
  Administered 2015-11-10: 2 mg via ARTERIOVENOUS_FISTULA
  Filled 2015-11-10: qty 1

## 2015-11-10 MED ORDER — SUCCINYLCHOLINE CHLORIDE 20 MG/ML IJ SOLN
INTRAMUSCULAR | Status: DC | PRN
  Administered 2015-11-10: 120 mg via INTRAVENOUS

## 2015-11-10 MED ORDER — MAGNESIUM SULFATE 2 GM/50ML IV SOLN
2.0000 g | Freq: Every day | INTRAVENOUS | Status: DC | PRN
  Filled 2015-11-10: qty 50

## 2015-11-10 MED ORDER — SUCCINYLCHOLINE CHLORIDE 20 MG/ML IJ SOLN
INTRAMUSCULAR | Status: AC
  Filled 2015-11-10: qty 1

## 2015-11-10 MED ORDER — PROMETHAZINE HCL 25 MG/ML IJ SOLN: 6.2500 mg | INTRAMUSCULAR | Status: DC | PRN

## 2015-11-10 MED ORDER — MORPHINE SULFATE (PF) 4 MG/ML IV SOLN
4.0000 mg | Freq: Once | INTRAVENOUS | Status: AC
  Administered 2015-11-10: 4 mg via INTRAVENOUS
  Filled 2015-11-10: qty 1

## 2015-11-10 MED ORDER — GABAPENTIN 400 MG PO CAPS
400.0000 mg | ORAL_CAPSULE | Freq: Three times a day (TID) | ORAL | Status: DC
  Administered 2015-11-10 – 2015-11-12 (×7): 400 mg via ORAL
  Filled 2015-11-10 (×8): qty 1

## 2015-11-10 MED ORDER — MORPHINE SULFATE (PF) 4 MG/ML IV SOLN
INTRAVENOUS | Status: AC
  Filled 2015-11-10: qty 1

## 2015-11-10 MED ORDER — DIALYVITE 3000 3 MG PO TABS: 1.0000 | ORAL_TABLET | Freq: Every day | ORAL | Status: DC

## 2015-11-10 MED ORDER — ONDANSETRON HCL 4 MG/2ML IJ SOLN
INTRAMUSCULAR | Status: AC
  Filled 2015-11-10: qty 2

## 2015-11-10 SURGICAL SUPPLY — 33 items
APL SKNCLS STERI-STRIP NONHPOA (GAUZE/BANDAGES/DRESSINGS)
BENZOIN TINCTURE PRP APPL 2/3 (GAUZE/BANDAGES/DRESSINGS) ×1 IMPLANT
BLADE SURG 10 STRL SS (BLADE) ×1 IMPLANT
CANISTER SUCTION 2500CC (MISCELLANEOUS) ×2 IMPLANT
CANISTER WOUND CARE 500ML ATS (WOUND CARE) ×1 IMPLANT
CANNULA VESSEL 3MM 2 BLNT TIP (CANNULA) ×4 IMPLANT
CLIP LIGATING EXTRA MED SLVR (CLIP) ×2 IMPLANT
CLIP LIGATING EXTRA SM BLUE (MISCELLANEOUS) ×2 IMPLANT
DRSG VAC ATS MED SENSATRAC (GAUZE/BANDAGES/DRESSINGS) ×1 IMPLANT
ELECT REM PT RETURN 9FT ADLT (ELECTROSURGICAL) ×2
ELECTRODE REM PT RTRN 9FT ADLT (ELECTROSURGICAL) ×1 IMPLANT
GLOVE BIO SURGEON STRL SZ 6.5 (GLOVE) ×3 IMPLANT
GLOVE BIOGEL PI IND STRL 6.5 (GLOVE) IMPLANT
GLOVE BIOGEL PI INDICATOR 6.5 (GLOVE) ×6
GLOVE ECLIPSE 6.5 STRL STRAW (GLOVE) ×1 IMPLANT
GLOVE SS BIOGEL STRL SZ 7.5 (GLOVE) ×1 IMPLANT
GLOVE SUPERSENSE BIOGEL SZ 7.5 (GLOVE) ×1
GOWN STRL REUS W/ TWL LRG LVL3 (GOWN DISPOSABLE) ×3 IMPLANT
GOWN STRL REUS W/TWL LRG LVL3 (GOWN DISPOSABLE) ×10
KIT BASIN OR (CUSTOM PROCEDURE TRAY) ×2 IMPLANT
KIT ROOM TURNOVER OR (KITS) ×2 IMPLANT
NS IRRIG 1000ML POUR BTL (IV SOLUTION) ×4 IMPLANT
PACK PERIPHERAL VASCULAR (CUSTOM PROCEDURE TRAY) ×2 IMPLANT
PAD ARMBOARD 7.5X6 YLW CONV (MISCELLANEOUS) ×4 IMPLANT
STAPLER VISISTAT 35W (STAPLE) IMPLANT
STRIP CLOSURE SKIN 1/2X4 (GAUZE/BANDAGES/DRESSINGS) ×1 IMPLANT
SUT PROLENE 5 0 C 1 24 (SUTURE) ×3 IMPLANT
SUT PROLENE 6 0 CC (SUTURE) ×1 IMPLANT
SWAB COLLECTION DEVICE MRSA (MISCELLANEOUS) ×1 IMPLANT
SWAB CULTURE ESWAB REG 1ML (MISCELLANEOUS) ×1 IMPLANT
TRAY FOLEY W/METER SILVER 16FR (SET/KITS/TRAYS/PACK) ×2 IMPLANT
UNDERPAD 30X30 INCONTINENT (UNDERPADS AND DIAPERS) ×2 IMPLANT
WATER STERILE IRR 1000ML POUR (IV SOLUTION) ×2 IMPLANT

## 2015-11-10 NOTE — ED Notes (Signed)
Per [facility]: Pt is from [facility]. Pt has been having nausea, vomiting, diarrhea according to her. Facility called saying pt was "lethargic". Facility stated that they couldn't say much about her because "they just got her", pt has been at that facility since February. Pts temp was 102 at facility, pt was given tylenol, unknown how much. According to [facility] tylenol was not administered. Facility states that they gave tylenol around 1 am.Pt is a dialysis pt, due for dialysis today 14th. Pt goes from saying her right leg hurts, will then say that it is her left leg, then changes to right leg. Pt did not seem confused to EMS. Blood sugar with EMS was 343. 12 lead showed occasional PVCs.

## 2015-11-10 NOTE — Progress Notes (Signed)
[place] with doppler studies spoke with Maudie Mercury pa regarding doppler studies

## 2015-11-10 NOTE — Op Note (Signed)
    OPERATIVE REPORT  DATE OF SURGERY: 11/20/2015  PATIENT: [name], 81 y.o. female MRN: [id number]  DOB: Nov 19, 1934  PRE-OPERATIVE DIAGNOSIS: Gas gangrene infection right groin  POST-OPERATIVE DIAGNOSIS:  Same  PROCEDURE: Extensive debridement of right groin with the skin and subcutaneous fat down to the fascia.  SURGEON:  Curt Jews, M.D.  PHYSICIAN ASSISTANT: Silva Bandy PA-C  ANESTHESIA:  Gen.  EBL: Minimal ml  Total I/O In: 350 [I.V.:350] Out: 200 [Urine:125; Blood:75]  BLOOD ADMINISTERED: None  DRAINS: VAC dressing  SPECIMEN: Wound culture  COUNTS CORRECT:  YES  PLAN OF CARE: PACU   PATIENT DISPOSITION:  PACU - hemodynamically stable  PROCEDURE DETAILS: The patient is a morbidly obese 81 year old female with end-stage renal disease who underwent right common femoral endarterectomy and right femoral to below-knee popliteal bypass with Gore-Tex graft with Dr. Scot Dock on 10/27/2015. She presented to the emergency room early this morning with increasing pain and breakdown of her groin. CT of her pelvis showed extensive gas gangrenous changes in her subcutaneous fat down to the level of the fascia. There was no obvious involvement of her prosthetic graft. She is taken to the operative minutes time for debridement. I did explain she may have to have removal of her graft and also explained that this would in all likelihood result and a subsequent amputation.  The patient was taken to the operating placed supine position where the area of the right groin right leg were prepped and draped in usual sterile fashion. Patient had a large full-thickness necrotic eschar with purulent drainage coming from this area. The eschar and surrounding skin was excised. The fat underneath this had gas present with a very foul-smelling odor. This was sent for aerobic and anaerobic culture. That was extensively been extensively debrided down to the level of the fascia. The inguinal  ligament was seen. There was no evidence of exposure of the graft material. Debridement over this area was relatively aggressive and the no purulence from this area was encountered. The fat necrosis did extend superiorly and laterally in this entire area was debrided back to healthy-appearing fat. A vac drain was brought onto the field. It was cut to the appropriate size and was applied. The patient was transferred to the recovery room in stable condition   Curt Jews, M.D. 11/19/2015 8:40 AM

## 2015-11-10 NOTE — Progress Notes (Signed)
Hemodialysis- System clotted with 30 minutes remaining. (no heparin tx). Dr. Florene Glen notified. OK to discontinue treatment for the day. Total UF 1409cc. Unable to meet goal d/t low bp. Administered morphine during treatment x2. Pt continues to cry out occasionally in discomfort.

## 2015-11-10 NOTE — Progress Notes (Signed)
notifed dr Linna Caprice of pts [id number] orders obtained and carried out

## 2015-11-10 NOTE — Consult Note (Signed)
[facility] Renal Consultation Note    Indication for Consultation:  Management of ESRD/hemodialysis; anemia, hypertension/volume and secondary hyperparathyroidism PCP:  HPI: [name] is a 81 y.o. female with ESRD on hemodialysis at [facility] TTS. Past medical history significant for hypertension, DM, cancer L kidney, R heart failure, MI, PVD, CAD, CVA, hypercalcemia related to secondary hyperparathyroidism, arthritis, sleep apnea, morbid obesity, anemia of chronic disease. She had  R fempopliteal bypass graft for critical limb ischemia with dry gangrenous changes in her right toes 10/27/15 per Dr. Scot Dock. She presented to ED this AM with fever, pain and breakdown of the skin in her R groin. Tmax  101.4 WBC 31.5.  CT of the area revealed gas gangrenous changes in her subcutaneous fat down to the level of the fascia. The graft does not appear to be involved. She was taken to the OR per Dr. Donnetta Hutching for aggressive  debridement and placement of wound vac. Cultures have been sent. Dr. Donnetta Hutching notes that graft may ultimately have to be removed which would possibly result in amputation of RLE. She has been started on Vancomycin and Zosyn per primary.   Patient is being seen in PACU. Currently she is lethargic and C/O pain in R. Groin. Denies chest pain, SOB at present. Will defer further questioning until patient is more comfortable and able to answer questions.  Patient has hemodialysis TTS at [facility] via R perm cath. Last in center values as follows: HGB 10.7 (11/05/15) Fe 82 TSAT 32 Ca 8.6 C Ca 9.1 phos 3.7  PTH 690 (10/22/15). She takes calcium acetate binders as well as fosrenol, sensipar and vitamin D for Phos/PTH suppression.   Past Medical History  Diagnosis Date  . Hypertension   . Heart failure [facility]) December 07, 2012    Right side  . Hypercalcemia     2nd to hyperparathyroidism  . Thyroid disease     Hyperparathyroidism  . Thyroid disease      Hypothyroidism  . Dyslipidemia   . CHF (congestive heart failure) ([place])   . Myocardial infarction ([place]) 10/28/2010  . Peripheral vascular disease ([place])   . Short-term memory loss   . Stroke ([place]) 2010ish    no residual  . Shingles 2010 ish  . Anginal pain ([place])   . Coronary artery disease   . Diabetes mellitus without complication ([place])     TYPE 2  . Parathyroid cyst ([place])   . Headache(784.0)     CALCIFICATION LT SKULL  . Arthritis     Gout  . Cancer ([place])     kidney left removed  . Sleep apnea     Recently test in [place](NEG) NO CPAP   . ESRD (end stage renal disease) ([place])     dialysis T-TH-SAT  . Hypothyroidism   . Shortness of breath     with exertion    Past Surgical History  Procedure Laterality Date  . Nephrectomy Left 2005  . Abdominal hysterectomy  2005  . Cholecystectomy  2005  . [place] stent    . Coronary stent placement Left 10/2010  . Av fistula placement Left 01/09/2013    Procedure: ARTERIOVENOUS (AV) FISTULA CREATION;  Surgeon: Mal Misty, MD;  Location: [place];  Service: Vascular;  Laterality: Left;  Creation of Left Brachial Cephalic Arteriovenous Fistula  . Bascilic vein transposition Left 07/30/2013    Procedure: 1ST Stage BASCILIC VEIN TRANSPOSITION;  Surgeon: Angelia Mould, MD;  Location: [place];  Service: Vascular;  Laterality: Left;  . Cardiac catheterization      2013 STENT PLACED  . Cataract extraction      BILAT  . Bascilic vein transposition Left 12/17/2013    Procedure: BASILIC VEIN TRANSPOSITION - LEFT ARM 2ND STAGE;  Surgeon: Angelia Mould, MD;  Location: [place];  Service: Vascular;  Laterality: Left;  . Insertion of dialysis catheter Left 03/04/2014    Procedure: INSERTION OF DIALYSIS CATHETER;  Surgeon: Conrad [place], MD;  Location: [place];  Service: Vascular;  Laterality: Left;  Marland Kitchen Eye surgery Bilateral     cataract surgery  . Appendectomy    . Bascilic vein transposition Right 04/18/2014    Procedure: BASILIC VEIN  TRANSPOSITION;  Surgeon: Angelia Mould, MD;  Location: [place];  Service: Vascular;  Laterality: Right;  . Ligation of arteriovenous  fistula Right 04/19/2014    Procedure: LIGATION OF ARTERIOVENOUS  FISTULA;  Surgeon: Serafina Mitchell, MD;  Location: [place];  Service: Vascular;  Laterality: Right;  . Av fistula placement Left 05/23/2014    Procedure: INSERTION OF ARTERIOVENOUS (AV) GORE-TEX GRAFT ARM;  Surgeon: Angelia Mould, MD;  Location: [place];  Service: Vascular;  Laterality: Left;  . Shuntogram Left 02/10/2014    Procedure: FISTULOGRAM;  Surgeon: Angelia Mould, MD;  Location: [facility] CATH LAB;  Service: Cardiovascular;  Laterality: Left;  . Peripheral vascular catheterization N/A 10/12/2015    Procedure: Abdominal Aortogram w/Lower Extremity;  Surgeon: Angelia Mould, MD;  Location: [place] CV LAB;  Service: Cardiovascular;  Laterality: N/A;  . Peripheral vascular catheterization N/A 10/12/2015    Procedure: Upper Extremity Angiography/Right;  Surgeon: Angelia Mould, MD;  Location: [place] CV LAB;  Service: Cardiovascular;  Laterality: N/A;  . Femoral-popliteal bypass graft Right 10/27/2015    Procedure: BYPASS GRAFT FEMORAL-POPLITEAL ARTERY USING 6MM X 80CM GORE PROPATEN VASCULAR GRAFT;  Surgeon: Angelia Mould, MD;  Location: [place];  Service: Vascular;  Laterality: Right;  . Endarterectomy femoral Right 10/27/2015    Procedure: ENDARTERECTOMY RIGHT COMMON FEMORAL ARTERY;  Surgeon: Angelia Mould, MD;  Location: [place];  Service: Vascular;  Laterality: Right;  . Vein harvest Right 10/27/2015    Procedure: VEIN HARVEST RIGHT GREATER SAPHENOUS;  Surgeon: Angelia Mould, MD;  Location: [place];  Service: Vascular;  Laterality: Right;  . Patch angioplasty Right 10/27/2015    Procedure: VEIN PATCH ANGIOPLASTY RIGHT COMMON FEMORAL ARTERY;  Surgeon: Angelia Mould, MD;  Location: [place];  Service: Vascular;  Laterality: Right;  . Intraoperative  arteriogram Right 10/27/2015    Procedure: INTRA OPERATIVE ARTERIOGRAM RIGHT LOWER LEG;  Surgeon: Angelia Mould, MD;  Location: [facility] OR;  Service: Vascular;  Laterality: Right;   Family History  Problem Relation Age of Onset  . Diabetes Sister   . Heart disease Sister     before age 37  . Hyperlipidemia Sister   . Hypertension Sister   . Diabetes Sister   . Hyperlipidemia Sister   . Hypertension Sister    Social History:  reports that she quit smoking about 17 years ago. Her smoking use included Cigarettes. She quit after 46 years of use. She has never used smokeless tobacco. She reports that she does not drink alcohol or use illicit drugs. Allergies  Allergen Reactions  . Macrobid [Nitrofurantoin Macrocrystal] Anaphylaxis and Rash  . Ace Inhibitors Other (See Comments)    hyperkalemia  . Angiotensin Receptor Blockers Other (See Comments)    hyperkalemia  . Influenza Vaccines  Itching, Rash and Other (See Comments)    blisters   Prior to Admission medications   Medication Sig Start Date End Date Taking? Authorizing Provider  acetaminophen (TYLENOL) 500 MG tablet Take 1,000 mg by mouth every 8 (eight) hours as needed for mild pain.    Yes Historical Provider, MD  albuterol (PROVENTIL HFA;VENTOLIN HFA) 108 (90 BASE) MCG/ACT inhaler Inhale 2 puffs into the lungs every 6 (six) hours as needed for wheezing or shortness of breath.   Yes Historical Provider, MD  albuterol (PROVENTIL) (2.5 MG/3ML) 0.083% nebulizer solution Take 2.5 mg by nebulization every 6 (six) hours as needed for wheezing or shortness of breath.    Yes Historical Provider, MD  aspirin 325 MG tablet Take 325 mg by mouth daily.   Yes Historical Provider, MD  calcium acetate (PHOSLO) 667 MG capsule Take 1,334 mg by mouth 3 (three) times daily with meals.  09/28/15  Yes Historical Provider, MD  cephALEXin (KEFLEX) 250 MG capsule Take 1 capsule (250 mg total) by mouth 3 (three) times daily. 10/12/15  Yes Angelia Mould, MD  cinacalcet (SENSIPAR) 30 MG tablet Take 30 mg by mouth daily with supper.   Yes Historical Provider, MD  docusate sodium (COLACE) 100 MG capsule Take 100 mg by mouth 2 (two) times daily.   Yes Historical Provider, MD  fenofibrate 54 MG tablet Take 54 mg by mouth daily.  05/06/14  Yes Historical Provider, MD  fluconazole (DIFLUCAN) 100 MG tablet Take 100 mg by mouth See admin instructions. On Monday, Wednesday, Friday and Sunday until 11/13/15   Yes Historical Provider, MD  folic acid-vitamin b complex-vitamin c-selenium-zinc (DIALYVITE) 3 MG TABS tablet Take 1 tablet by mouth daily.   Yes Historical Provider, MD  FOSRENOL 1000 MG chewable tablet Chew 1,000 mg by mouth 3 (three) times daily before meals. 09/17/15  Yes Historical Provider, MD  gabapentin (NEURONTIN) 400 MG capsule Take 400 mg by mouth 3 (three) times daily.   Yes Historical Provider, MD  insulin aspart (NOVOLOG) 100 UNIT/ML injection Inject 0-22 Units into the skin 3 (three) times daily with meals. CBG: 0-70= 0 units 71-100=9 units after meal 101-150=14 before meal 151-200=16 units before meal 201-250=17 units before meal 251-300=18 units before meal 301-350=19 units before meal 351-400=20 units before meal Over 400=22 units before meal 03/08/14  Yes Hosie Poisson, MD  levothyroxine (SYNTHROID, LEVOTHROID) 88 MCG tablet Take 88 mcg by mouth daily before breakfast.   Yes Historical Provider, MD  nitroGLYCERIN (NITROSTAT) 0.4 MG SL tablet Place 0.4 mg under the tongue every 5 (five) minutes as needed for chest pain.   Yes Historical Provider, MD  Omega-3 Fatty Acids (FISH OIL) 1000 MG CAPS Take 1,000 mg by mouth 2 (two) times daily.   Yes Historical Provider, MD  oxyCODONE-acetaminophen (PERCOCET/ROXICET) 5-325 MG tablet Take 1-2 tablets by mouth every 4 (four) hours as needed for moderate pain. 11/03/15  Yes Ulyses Amor, PA-C  cephALEXin (KEFLEX) 500 MG capsule Take 1 capsule (500 mg total) by mouth daily. Patient not  taking: Reported on 11/13/2015 11/03/15   Ulyses Amor, PA-C   Current Facility-Administered Medications  Medication Dose Route Frequency Provider Last Rate Last Dose  . HYDROmorphone (DILAUDID) 1 MG/ML injection           . HYDROmorphone (DILAUDID) injection 0.25-0.5 mg  0.25-0.5 mg Intravenous Q5 min PRN Duane Boston, MD   0.5 mg at 10/29/2015 0832  . insulin aspart (novoLOG) injection 10 Units  10 Units Subcutaneous  Once Roberts Gaudy, MD      . promethazine [facility]) injection 6.25-12.5 mg  6.25-12.5 mg Intravenous Q15 min PRN Duane Boston, MD       Labs: Basic Metabolic Panel:  Recent Labs Lab 11/04/2015 0345  NA 131*  K 4.5  CL 90*  CO2 21*  GLUCOSE 284*  BUN 46*  CREATININE 5.30*  CALCIUM 9.5   Liver Function Tests:  Recent Labs Lab 11/05/2015 0345  AST 17  ALT 12*  ALKPHOS 98  BILITOT 0.8  PROT 6.4*  ALBUMIN 1.9*   No results for input(s): LIPASE, AMYLASE in the last 168 hours. No results for input(s): AMMONIA in the last 168 hours. CBC:  Recent Labs Lab 11/16/2015 0345  WBC 31.5*  NEUTROABS 29.3*  HGB 10.0*  HCT 30.5*  MCV 89.2  PLT 441*   Cardiac Enzymes: No results for input(s): CKTOTAL, CKMB, CKMBINDEX, TROPONINI in the last 168 hours. CBG:  Recent Labs Lab 11/03/15 1140 11/03/15 1703 11/08/2015 0810  GLUCAP 186* 201* 328*   Iron Studies: No results for input(s): IRON, TIBC, TRANSFERRIN, FERRITIN in the last 72 hours. Studies/Results: Ct Pelvis W Contrast  11/12/2015  CLINICAL DATA:  Concern for postoperative wound infection. 10/27/2015 right inguinal surgery with bypass and common femoral artery endarterectomy. EXAM: CT PELVIS WITH CONTRAST TECHNIQUE: Multidetector CT imaging of the pelvis was performed using the standard protocol following the bolus administration of intravenous contrast. CONTRAST:  43mL OMNIPAQUE IOHEXOL 300 MG/ML  SOLN COMPARISON:  None available FINDINGS: There is soft tissue emphysema clustered in the right groin and lower  quadrant subcutaneous fat, extending to but not involving the abdominal wall musculature. There is no measurable fluid collection. No interfascial gas or stranding. Cellulitis surrounds the emphysematous area. No intra-abdominal inflammatory changes are noted. Extensive diverticulosis. Patient's right common femoral artery bypass graft is noted. These results were called by telephone at the time of interpretation on 11/02/2015 at 5:00 am to Dr. Thayer Jew , who verbally acknowledged these results. IMPRESSION: Soft tissue emphysema/necrotizing infection in the right groin and lower quadrant subcutaneous fat. Electronically Signed   By: Monte Fantasia M.D.   On: 11/07/2015 04:59   Dg Chest Portable 1 View  11/01/2015  CLINICAL DATA:  Fever and right leg pain EXAM: PORTABLE CHEST 1 VIEW COMPARISON:  05/23/2014 FINDINGS: Dialysis catheter from the right with tip at the upper right atrium. There is chronic cardiomegaly, accentuated by rotation and mediastinal fat pad. Calcified mediastinal lymph nodes. Artifact from EKG leads. Chronic interstitial coarsening which is bronchitic appearing. There is no edema, consolidation, effusion, or pneumothorax. IMPRESSION: Chronic bronchitic markings.  No focal pneumonia. Electronically Signed   By: Monte Fantasia M.D.   On: 11/25/2015 03:42    ROS: As per HPI otherwise negative.   Physical Exam: Filed Vitals:   10/29/2015 0600 11/08/2015 0800 11/21/2015 0815 11/03/2015 0830  BP: 156/59 146/100 141/76 151/69  Pulse: 101 86 98 84  Temp:  97.9 F (36.6 C)    TempSrc:      Resp:  12 20 14   SpO2: 97% 94% 96% 97%     General: Well developed, well nourished, in mild distress due to acute post op pain.  Head: Normocephalic, atraumatic, sclera non-icteric, mucus membranes are moist Neck: Supple. JVD not elevated. Lungs: Clear bilaterally to auscultation without wheezes, rales, or rhonchi. Breathing is unlabored. Heart: Irregular, S1, S2, II/VI systolic M. SR with freq.  PACS on monitor.  Abdomen: Soft, non-tender, non-distended with few hypoactive bowel sounds. No  rebound/guarding. No obvious abdominal masses. M-S:  Strength and tone appear normal for age. Lower extremities: 1+ pitting  BLE edema. Necrotic appearing R great toe, R. Leg slightly mottled. Wound vac in place R. Groin. Area reddened around wound vac. No LE pulses.  Neuro: Alert and oriented X 3. Moves all extremities spontaneously. Psych:  Responds to questions appropriately with a normal affect. Dialysis Access: R perm cath Drsg CDI.   Dialysis Orders: Center: [facility]  on TTS . TueThuSat, 3 hrs 30 min, 160NRe Optiflux, BFR 400, DFR Autoflow 1.5, EDW 87 (kg), Dialysate 2.0 K, 2.25 Ca, UFR Profile: None, Sodium Model: None, Access: RIJ perm Catheter-Tunneled Heparin: 4000 units per treatment Calcitriol: 1.0 mcg PO q TTS Venofer: 50 mg IV q week (last dose 11/05/15)  Assessment/Plan: 1.  Sepsis/Debridement of R. Groin: Per primary . WBC 31.5, cultures pending-has been started on vanc/zosyn.  2.  ESRD -  TTS @ [place]. Will have HD today on schedule K+ 4.5 2.0 K 2.25 Ca bath. NO HEPARIN.  3.  Hypertension/volume  - BP ^ most likely pain related at present. EDW 87 kg. No wt has been done. Will attempt 2-3 liters. No antihypertensive meds on med list.  4.  Anemia  - Hgb 10. Follow HGB. No venofer due to sepsis.  5.  Metabolic bone disease -  Continue binders, sensipar, VDRA.  6.  Nutrition - Albumin 1.9, Severe protein malnutrition. Renal diet when able to eat, add prostat and vitamins.  7.  DM: per primary No diabetic medications on med list however BS [id number].   Rita H. Owens Shark, NP-C 11/19/2015, 8:46 AM  [employer] [phone]  Renal Attending: As above note. Will plan HD for today and address relevant renal related issues. [place] Greb C

## 2015-11-10 NOTE — Progress Notes (Signed)
Rcv'd report from [place] in [place] pt is going to dialysis before coming to unit. Servando Salina Tracye Szuch 1:07 PM 11/02/2015

## 2015-11-10 NOTE — Transfer of Care (Signed)
Immediate Anesthesia Transfer of Care Note  Patient: [name]  Procedure(s) Performed: Procedure(s): Debridment if right groin gas gangrene  (Right)  Patient Location: PACU  Anesthesia Type:General  Level of Consciousness: lethargic and responds to stimulation  Airway & Oxygen Therapy: Patient Spontanous Breathing and Patient connected to nasal cannula oxygen  Post-op Assessment: Report given to RN  Post vital signs: Reviewed and stable  Last Vitals:  Filed Vitals:   11/19/2015 0545 11/13/2015 0600  BP: 164/63 156/59  Pulse: 55 101  Temp:    Resp: 22     Complications: No apparent anesthesia complications

## 2015-11-10 NOTE — ED Notes (Signed)
Dr Early in room with pt to discuss procedure. Pt is going to the OR soon. Pt signed consent form with Dr Donnetta Hutching and this RN as witness.

## 2015-11-10 NOTE — Progress Notes (Signed)
Hemodialysis- Pt arrived from PACU to HD. Answering questions appropriately but lethargic post procedure. Vitals stable. HD initiated via R Chest HD cath. Vac to R Groin. No right leg pulses as notified of in report. Pt sleeping but calling out occasionally. Continue to monitor.

## 2015-11-10 NOTE — Consult Note (Signed)
[facility] for Infectious Disease    Date of Admission:  10/30/2015   Total days of antibiotics 1        Vancomycin 3/14 >>        Piperacillin-Tazobactam 3/14>>               Reason for Consult: Gas gangrene infection of right groin    Referring Physician: Dr. Curt Jews Primary Care Physician: Shayne Alken  Active Problems:   Abscess of right groin   . calcitRIOL  1 mcg Oral Q T,Th,Sa-HD  . calcium acetate  1,334 mg Oral TID WC  . feeding supplement (PRO-STAT SUGAR FREE 64)  30 mL Oral BID  . HYDROmorphone      . HYDROmorphone      . insulin aspart      . insulin aspart      . insulin aspart  0-22 Units Subcutaneous TID WC  . insulin aspart  18 Units Subcutaneous Once  . lanthanum  1,000 mg Oral TID WC  . morphine      . multivitamin  1 tablet Oral QHS    Recommendations: 1. Continue vancomycin and piperacillin pending intraoperative cultures   Assessment: Ms. Miedema has a severe infection of her right groin and is status post debridement with Dr. Donnetta Hutching this morning.  CT scan showed extensive gas in the groin area and pannus.  Intraoperative aerobic and anaerobic cultures have been sent.   There was no graft material encountered during the surgery and appears that abscess was contained to the groin and pannus area.    HPI: [name] is a 81 y.o. female with complex past medical history that includes ESRD, morbid obesity, coronary artery disease, peripheral vascular disease, diabetes mellitus type 2, and recent right femoral endarterectomy and vein patch and right femoral to below-knee popliteal bypass with Gore-Tex graft with Dr. Scot Dock on 10/27/2015.  She presented to the emergency department this morning with increasingly severe pain to her right groin, fever, and skin breakdown of right groin.  During my evaluation, she was still in the PACU and unable to provide much information due to her sedation and pain medication requirement.  Per review of  her chart, pain began about 1 week ago and that physician at skilled nursing facility was keeping the area dry.  She had a CT scan of her pelvis that showed extensive gas in the groin area and large pannus.  She was emergently to the OR by Dr. Donnetta Hutching for debridement and placement of wound vac.   Review of Systems: Review of Systems  Unable to perform ROS   Past Medical History  Diagnosis Date  . Hypertension   . Heart failure [facility]) December 07, 2012    Right side  . Hypercalcemia     2nd to hyperparathyroidism  . Thyroid disease     Hyperparathyroidism  . Thyroid disease     Hypothyroidism  . Dyslipidemia   . CHF (congestive heart failure) ([place])   . Myocardial infarction ([place]) 10/28/2010  . Peripheral vascular disease ([place])   . Short-term memory loss   . Stroke ([place]) 2010ish    no residual  . Shingles 2010 ish  . Anginal pain ([place])   . Coronary artery disease   . Diabetes mellitus without complication ([place])     TYPE 2  . Parathyroid cyst ([place])   . Headache(784.0)     CALCIFICATION LT SKULL  . Arthritis  Gout  . Cancer ([place])     kidney left removed  . Sleep apnea     Recently test in [place](NEG) NO CPAP   . ESRD (end stage renal disease) ([place])     dialysis T-TH-SAT  . Hypothyroidism   . Shortness of breath     with exertion     Social History  Substance Use Topics  . Smoking status: Former Smoker -- 46 years    Types: Cigarettes    Quit date: 01/08/1998  . Smokeless tobacco: Never Used  . Alcohol Use: No    Family History  Problem Relation Age of Onset  . Diabetes Sister   . Heart disease Sister     before age 62  . Hyperlipidemia Sister   . Hypertension Sister   . Diabetes Sister   . Hyperlipidemia Sister   . Hypertension Sister    Allergies  Allergen Reactions  . Macrobid [Nitrofurantoin Macrocrystal] Anaphylaxis and Rash  . Ace Inhibitors Other (See Comments)    hyperkalemia  . Angiotensin Receptor Blockers Other (See Comments)    hyperkalemia   . Influenza Vaccines Itching, Rash and Other (See Comments)    blisters    OBJECTIVE: Blood pressure 120/47, pulse 78, temperature 98 F (36.7 C), temperature source Oral, resp. rate 20, SpO2 98 %.  Physical Exam   GENERAL- well developed, well nourished female, intermittently groaning in pain HEENT- atraumatic, normocephalic CARDIAC- RRR, 2/6 systolic murmur RESP- clear to ausculation anterior fields, normal work of breathing ABDOMEN- obese, soft, nontender, no guarding or rebound NEURO- alert and oriented EXTREMITIES- 1+ edema of bilateral lower extremities.  Right leg has mottled appearance with necrotic right 1st toe.  Wound vac placed to right groin with pannus overhanging vac.  Area around vac erythematous.  Right leg with post surgical sutures in place SKIN- intact, warm and dry, HD cath in place to right chest wall  Lab Results Lab Results  Component Value Date   WBC 31.5* 11/08/2015   HGB 10.0* 11/02/2015   HCT 30.5* 11/19/2015   MCV 89.2 11/14/2015   PLT 441* 11/27/2015    Lab Results  Component Value Date   CREATININE 5.30* 11/20/2015   BUN 46* 11/23/2015   NA 131* 10/29/2015   K 4.5 11/18/2015   CL 90* 11/19/2015   CO2 21* 11/02/2015    Lab Results  Component Value Date   ALT 12* 11/13/2015   AST 17 11/16/2015   ALKPHOS 98 11/02/2015   BILITOT 0.8 11/08/2015     Microbiology: No results found for this or any previous visit (from the past 240 hour(s)).  Jule Ser, DO  11/20/2015, 1:00 PM   Addendum: I have seen and examined Ms. Izola Price, reviewed the operative report and discussed her care with Dr. Juleen China. Ms. Kreiter developed a necrotizing soft tissue wound infection of her right groin following recent right femoral to popliteal bypass graft. Fortunately there was no obvious exposed graft at the time of surgery this morning. Operative Gram stain and cultures are pending. I agree with empiric vancomycin and piperacillin tazobactam for  now.  Michel Bickers, MD [facility] [place] [facility] [phone] pager   [phone] cell 11/13/2015, 5:29 PM

## 2015-11-10 NOTE — Anesthesia Procedure Notes (Signed)
Procedure Name: Intubation Date/Time: 11/06/2015 6:32 AM Performed by: Claris Che Pre-anesthesia Checklist: Patient identified, Emergency Drugs available, Suction available, Patient being monitored and Timeout performed Patient Re-evaluated:Patient Re-evaluated prior to inductionOxygen Delivery Method: Circle system utilized Preoxygenation: Pre-oxygenation with 100% oxygen Intubation Type: IV induction, Rapid sequence and Cricoid Pressure applied Laryngoscope Size: Mac and 3 Grade View: Grade II Tube type: Oral Tube size: 8.0 mm Number of attempts: 1 Airway Equipment and Method: Stylet Placement Confirmation: ETT inserted through vocal cords under direct vision,  positive ETCO2 and breath sounds checked- equal and bilateral Secured at: 24 cm Tube secured with: Tape Dental Injury: Teeth and Oropharynx as per pre-operative assessment

## 2015-11-10 NOTE — Progress Notes (Signed)
Unable to palpate or5 dopple any pulses right foot foot cold and pale spoke with KIM pa she will come by to see pt

## 2015-11-10 NOTE — Progress Notes (Signed)
Inpatient Diabetes Program Recommendations  AACE/ADA: New Consensus Statement on Inpatient Glycemic Control (2015)  Target Ranges:  Prepandial:   less than 140 mg/dL      Peak postprandial:   less than 180 mg/dL (1-2 hours)      Critically ill patients:  140 - 180 mg/dL   Review of Glycemic Control  Diabetes history: DM 2 Outpatient Diabetes medications: Novolog 0-22 units TID with meals Current orders for Inpatient glycemic control: None  Inpatient Diabetes Program Recommendations:   During last admission, patient was ordered a custom scale of meal coverage and glucose was controlled. Please consider ordering custom scale when patient is out of surgery and is able to eat at least 50% of meals.   CBG: 0-70= 0 units 71-100=9 units after meal 101-150=14 before meal 151-200=16 units before meal 201-250=17 units before meal 251-300=18 units before meal 301-350=19 units before meal 351-400=20 units before meal Over 400=22 units before meal  Thanks,  Tama Headings RN, MSN, [facility] Inpatient Diabetes Coordinator Team Pager [phone] (8a-5p)

## 2015-11-10 NOTE — Anesthesia Preprocedure Evaluation (Addendum)
Anesthesia Evaluation  Patient identified by MRN, date of birth, ID band Patient awake    Reviewed: Allergy & Precautions, NPO status , Patient's Chart, lab work & pertinent test results  History of Anesthesia Complications Negative for: history of anesthetic complications  Airway Mallampati: II   Neck ROM: Limited  Mouth opening: Limited Mouth Opening  Dental  (+) Edentulous Upper, Dental Advisory Given   Pulmonary shortness of breath, sleep apnea , former smoker,    breath sounds clear to auscultation       Cardiovascular hypertension, + angina + CAD, + Past MI, + Peripheral Vascular Disease and +CHF   Rhythm:Regular Rate:Normal  Echo 08/2015 LVSF 55-60%, no significant valvulopathy   Neuro/Psych  Headaches, CVA, No Residual Symptoms negative psych ROS   GI/Hepatic   Endo/Other  diabetes, Poorly Controlled, Type 2Hypothyroidism   Renal/GU Renal disease     Musculoskeletal  (+) Arthritis ,   Abdominal (+) + obese,   Peds  Hematology   Anesthesia Other Findings   Reproductive/Obstetrics                           Anesthesia Physical  Anesthesia Plan  ASA: III and emergent  Anesthesia Plan: General   Post-op Pain Management:    Induction: Intravenous  Airway Management Planned: Oral ETT  Additional Equipment:   Intra-op Plan:   Post-operative Plan: Extubation in OR and Possible Post-op intubation/ventilation  Informed Consent: I have reviewed the patients History and Physical, chart, labs and discussed the procedure including the risks, benefits and alternatives for the proposed anesthesia with the patient or authorized representative who has indicated his/her understanding and acceptance.   Dental advisory given  Plan Discussed with: CRNA, Anesthesiologist and Surgeon  Anesthesia Plan Comments:         Anesthesia Quick Evaluation

## 2015-11-10 NOTE — Progress Notes (Signed)
Report given to rasonda rn on 2w notifed her that pt has no right leg pulses leg pale  No neeed to call vascular services or dr Bridgett Larsson they are aware per Maudie Mercury PA

## 2015-11-10 NOTE — Progress Notes (Signed)
VASCULAR LAB PRELIMINARY  PRELIMINARY  PRELIMINARY  PRELIMINARY  Right lower extremity bypass graft duplex completed.    Preliminary report:  After extensive imaging - The graft could not be visualized. There is no color flow or arterial flow noted.  Amine Adelson, [place], RVS 11/04/2015, 11:01 AM  Ralene Cork. RVT 11/14/2015, 11:01 AM

## 2015-11-10 NOTE — Progress Notes (Addendum)
  [facility] Progress Note  Subjective  - Day of Surgery  Asked by PACU staff to evaluate right foot. Patient with no doppler flow to right foot. Patient says pain in left foot is unchanged since pre-op.   Objective Filed Vitals:   11/13/2015 0845 11/21/2015 0900  BP: 127/68 142/61  Pulse: 84 70  Temp:    Resp: 19 24    Right foot is cool with dry gangrenous changes of foot. No doppler flow left foot.   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 No change in pain in left foot since before original surgery.  No graft material was encountered during surgery. Abscess was contained to groin/pannus area. If infection persists and involves bypass, may need removal and possible AKA.  IV vanc/zosyn. ID consulted. Nephrology consulted for dialysis To 2W when bed available.    Addendum Unable to visual flow in graft with duplex. Patient likely occluded graft sometime in last couple weeks. Will not pursue any surgical intervention. Her most important concern is her infection, which may likely infect graft, resulting in graft removal and AKA.   Virgina Jock, PA-C [facility] Office: [phone] Pager: [phone] 11/18/2015 9:29 AM

## 2015-11-10 NOTE — H&P (Signed)
Patient name: [name] MRN: [id number] DOB: [birth year] Sex: female      HISTORY OF PRESENT ILLNESS: The patient is a very complex morbidly obese 81 year old end-stage renal disease patient. She presented to the emergency room [facility] this morning with increasing pain in her right groin. She had undergone a right femoral endarterectomy and vein patch and right femoral to below-knee popliteal bypass with Gore-Tex graft with Dr. Scot Dock on 10/27/2015. She had a critical limb ischemia with dry gangrenous changes in her right toes. She had an uneventful initial postoperative course and was discharged to a skilled nursing facility. The patient is in severe pain. She reports this is been present for approximately 1 week. She reports that physician had seen her at the nursing facility regarding this and was  Keeping the area dry. She is morbidly obese with a very large pannus overhanging this area. She reports no increase in pain in her right foot. She has no pain in her left foot. She currently undergoes dialysis via a IJ catheter.  Past Medical History  Diagnosis Date  . Hypertension   . Heart failure [facility]) December 07, 2012    Right side  . Hypercalcemia     2nd to hyperparathyroidism  . Thyroid disease     Hyperparathyroidism  . Thyroid disease     Hypothyroidism  . Dyslipidemia   . CHF (congestive heart failure) ([place])   . Myocardial infarction ([place]) 10/28/2010  . Peripheral vascular disease ([place])   . Short-term memory loss   . Stroke ([place]) 2010ish    no residual  . Shingles 2010 ish  . Anginal pain ([place])   . Coronary artery disease   . Diabetes mellitus without complication ([place])     TYPE 2  . Parathyroid cyst ([place])   . Headache(784.0)     CALCIFICATION LT SKULL  . Arthritis     Gout  . Cancer ([place])     kidney left removed  . Sleep apnea     Recently test in (NEG) NO CPAP   . ESRD (end stage renal disease) ([place])     dialysis T-TH-SAT  . Hypothyroidism   .  Shortness of breath     with exertion     Past Surgical History  Procedure Laterality Date  . Nephrectomy Left 2005  . Abdominal hysterectomy  2005  . Cholecystectomy  2005  . [place] stent    . Coronary stent placement Left 10/2010  . Av fistula placement Left 01/09/2013    Procedure: ARTERIOVENOUS (AV) FISTULA CREATION;  Surgeon: Mal Misty, MD;  Location: [place];  Service: Vascular;  Laterality: Left;  Creation of Left Brachial Cephalic Arteriovenous Fistula  . Bascilic vein transposition Left 07/30/2013    Procedure: 1ST Stage BASCILIC VEIN TRANSPOSITION;  Surgeon: Angelia Mould, MD;  Location: [place];  Service: Vascular;  Laterality: Left;  . Cardiac catheterization      2013 STENT PLACED  . Cataract extraction      BILAT  . Bascilic vein transposition Left 12/17/2013    Procedure: BASILIC VEIN TRANSPOSITION - LEFT ARM 2ND STAGE;  Surgeon: Angelia Mould, MD;  Location: [place];  Service: Vascular;  Laterality: Left;  . Insertion of dialysis catheter Left 03/04/2014    Procedure: INSERTION OF DIALYSIS CATHETER;  Surgeon: Conrad , MD;  Location: [place];  Service: Vascular;  Laterality: Left;  Marland Kitchen Eye surgery Bilateral     cataract surgery  . Appendectomy    .  Bascilic vein transposition Right 04/18/2014    Procedure: BASILIC VEIN TRANSPOSITION;  Surgeon: Angelia Mould, MD;  Location: [place];  Service: Vascular;  Laterality: Right;  . Ligation of arteriovenous  fistula Right 04/19/2014    Procedure: LIGATION OF ARTERIOVENOUS  FISTULA;  Surgeon: Serafina Mitchell, MD;  Location: [place];  Service: Vascular;  Laterality: Right;  . Av fistula placement Left 05/23/2014    Procedure: INSERTION OF ARTERIOVENOUS (AV) GORE-TEX GRAFT ARM;  Surgeon: Angelia Mould, MD;  Location: [place];  Service: Vascular;  Laterality: Left;  . Shuntogram Left 02/10/2014    Procedure: FISTULOGRAM;  Surgeon: Angelia Mould, MD;  Location: [facility] CATH LAB;  Service: Cardiovascular;   Laterality: Left;  . Peripheral vascular catheterization N/A 10/12/2015    Procedure: Abdominal Aortogram w/Lower Extremity;  Surgeon: Angelia Mould, MD;  Location: [place] CV LAB;  Service: Cardiovascular;  Laterality: N/A;  . Peripheral vascular catheterization N/A 10/12/2015    Procedure: Upper Extremity Angiography/Right;  Surgeon: Angelia Mould, MD;  Location: [place] CV LAB;  Service: Cardiovascular;  Laterality: N/A;  . Femoral-popliteal bypass graft Right 10/27/2015    Procedure: BYPASS GRAFT FEMORAL-POPLITEAL ARTERY USING 6MM X 80CM GORE PROPATEN VASCULAR GRAFT;  Surgeon: Angelia Mould, MD;  Location: [place];  Service: Vascular;  Laterality: Right;  . Endarterectomy femoral Right 10/27/2015    Procedure: ENDARTERECTOMY RIGHT COMMON FEMORAL ARTERY;  Surgeon: Angelia Mould, MD;  Location: [place];  Service: Vascular;  Laterality: Right;  . Vein harvest Right 10/27/2015    Procedure: VEIN HARVEST RIGHT GREATER SAPHENOUS;  Surgeon: Angelia Mould, MD;  Location: [place];  Service: Vascular;  Laterality: Right;  . Patch angioplasty Right 10/27/2015    Procedure: VEIN PATCH ANGIOPLASTY RIGHT COMMON FEMORAL ARTERY;  Surgeon: Angelia Mould, MD;  Location: [place];  Service: Vascular;  Laterality: Right;  . Intraoperative arteriogram Right 10/27/2015    Procedure: INTRA OPERATIVE ARTERIOGRAM RIGHT LOWER LEG;  Surgeon: Angelia Mould, MD;  Location: [facility] OR;  Service: Vascular;  Laterality: Right;    Social History   Social History  . Marital Status: Widowed    Spouse Name: N/A  . Number of Children: N/A  . Years of Education: N/A   Occupational History  . Not on file.   Social History Main Topics  . Smoking status: Former Smoker -- 46 years    Types: Cigarettes    Quit date: 01/08/1998  . Smokeless tobacco: Never Used  . Alcohol Use: No  . Drug Use: No  . Sexual Activity: Not on file   Other Topics Concern  . Not on file   Social  History Narrative    Family History  Problem Relation Age of Onset  . Diabetes Sister   . Heart disease Sister     before age 47  . Hyperlipidemia Sister   . Hypertension Sister   . Diabetes Sister   . Hyperlipidemia Sister   . Hypertension Sister     Allergies as of 11/16/2015 - Review Complete 11/01/2015  Allergen Reaction Noted  . Macrobid [nitrofurantoin macrocrystal] Anaphylaxis and Rash 01/08/2013  . Ace inhibitors Other (See Comments) 03/01/2014  . Angiotensin receptor blockers Other (See Comments) 03/01/2014  . Influenza vaccines Itching, Rash, and Other (See Comments) 01/08/2013    No current facility-administered medications on file prior to encounter.   Current Outpatient Prescriptions on File Prior to Encounter  Medication Sig Dispense Refill  . acetaminophen (TYLENOL) 500 MG tablet  Take 1,000 mg by mouth every 8 (eight) hours as needed for mild pain.     Marland Kitchen albuterol (PROVENTIL HFA;VENTOLIN HFA) 108 (90 BASE) MCG/ACT inhaler Inhale 2 puffs into the lungs every 6 (six) hours as needed for wheezing or shortness of breath.    Marland Kitchen albuterol (PROVENTIL) (2.5 MG/3ML) 0.083% nebulizer solution Take 2.5 mg by nebulization every 6 (six) hours as needed for wheezing or shortness of breath.     Marland Kitchen aspirin 325 MG tablet Take 325 mg by mouth daily.    . calcium acetate (PHOSLO) 667 MG capsule Take 1,334 mg by mouth 3 (three) times daily with meals.     . cephALEXin (KEFLEX) 250 MG capsule Take 1 capsule (250 mg total) by mouth 3 (three) times daily. 42 capsule 1  . cinacalcet (SENSIPAR) 30 MG tablet Take 30 mg by mouth daily with supper.    . fenofibrate 54 MG tablet Take 54 mg by mouth daily.     . folic acid-vitamin b complex-vitamin c-selenium-zinc (DIALYVITE) 3 MG TABS tablet Take 1 tablet by mouth daily.    Marland Kitchen FOSRENOL 1000 MG chewable tablet Chew 1,000 mg by mouth 3 (three) times daily before meals.    . gabapentin (NEURONTIN) 400 MG capsule Take 400 mg by mouth 3 (three) times  daily.    . insulin aspart (NOVOLOG) 100 UNIT/ML injection Inject 0-22 Units into the skin 3 (three) times daily with meals. CBG: 0-70= 0 units 71-100=9 units after meal 101-150=14 before meal 151-200=16 units before meal 201-250=17 units before meal 251-300=18 units before meal 301-350=19 units before meal 351-400=20 units before meal Over 400=22 units before meal    . levothyroxine (SYNTHROID, LEVOTHROID) 88 MCG tablet Take 88 mcg by mouth daily before breakfast.    . nitroGLYCERIN (NITROSTAT) 0.4 MG SL tablet Place 0.4 mg under the tongue every 5 (five) minutes as needed for chest pain.    . Omega-3 Fatty Acids (FISH OIL) 1000 MG CAPS Take 1,000 mg by mouth 2 (two) times daily.    Marland Kitchen oxyCODONE-acetaminophen (PERCOCET/ROXICET) 5-325 MG tablet Take 1-2 tablets by mouth every 4 (four) hours as needed for moderate pain. 30 tablet 0  . cephALEXin (KEFLEX) 500 MG capsule Take 1 capsule (500 mg total) by mouth daily. (Patient not taking: Reported on 11/12/2015) 14 capsule 1     REVIEW OF SYSTEMS:  Positives indicated with an "X"  CARDIOVASCULAR:  [ ]  chest pain   [ ]  chest pressure   [ ]  palpitations   [ ]  orthopnea   [ ]  dyspnea on exertion   [ ]  claudication   [ ]  rest pain   [ ]  DVT   [ ]  phlebitis PULMONARY:   [ ]  productive cough   [ ]  asthma   [ ]  wheezing NEUROLOGIC:   [ ]  weakness  [ ]  paresthesias  [ ]  aphasia  [ ]  amaurosis  [ ]  dizziness HEMATOLOGIC:   [ ]  bleeding problems   [ ]  clotting disorders MUSCULOSKELETAL:  [ ]  joint pain   [ ]  joint swelling GASTROINTESTINAL: [ ]   blood in stool  [ ]   hematemesis GENITOURINARY:  [ ]   dysuria  [ ]   hematuria PSYCHIATRIC:  [ ]  history of major depression INTEGUMENTARY:  [ ]  rashes  [ ]  ulcers CONSTITUTIONAL:  [ ]  fever   [ ]  chills  PHYSICAL EXAMINATION:  General: The patient is a well-nourished female, iShe reports severe pain and is calling out in pain. Vital signs are BP  156/59 mmHg  Pulse 101  Temp(Src) 98.3 F (36.8 C) (Oral)   Resp 22  SpO2 97% Pulmonary: There is a good air exchange  Abdomen: Soft and non-tender Musculoskeletal: There are no major deformities.   Neurologic: No focal weakness or paresthesias are detected, Skin: She has a large full-thickness eschar at the groin incision with marked erythema and tenderness surrounding the entire area. Gangrene on her great and second toe with dry gangrenous changes on the right foot. Left foot is normal Psychiatric: The patient has normal affect. Cardiovascular: No palpable distal pulses   CT of her pelvis shows extensive gas in their entire groin area and this large pannus.  Impression and Plan:  Severe infection in her right groin. Explained the need for emergent operative debridement. Also explain with her prosthetic Gore-Tex graft and almost certain that this is involved and will require rremoval. Explain the high risk for eventual amputation if this is required. Her prior vein map does show that she has vein on the other side but would not be  Possible to place this in this extensive gas-filled infection. Will most likely require postoperative wound VAC.Will take immediately to the operating room this morning    Ariam Mol [facility] [place] Office: [phone]

## 2015-11-10 NOTE — Progress Notes (Signed)
Patient has arrived on unit from dialysis originally from [facility]. Patient oriented to unit, briefly assessed, placed on tele, VS were stable. Pt. c/o of pain PRN was given before transport in dialysis. No PRN's available to be given will reassess pain shortly.

## 2015-11-10 NOTE — Progress Notes (Signed)
ANTIBIOTIC CONSULT NOTE - INITIAL  Pharmacy Consult for vancomycin and zosyn Indication: wound infection  Allergies  Allergen Reactions  . Macrobid [Nitrofurantoin Macrocrystal] Anaphylaxis and Rash  . Ace Inhibitors Other (See Comments)    hyperkalemia  . Angiotensin Receptor Blockers Other (See Comments)    hyperkalemia  . Influenza Vaccines Itching, Rash and Other (See Comments)    blisters    Patient Measurements: Weight: 205 lb 0.4 oz (93 kg) Adjusted Body Weight:   Vital Signs: Temp: 98 F (36.7 C) (03/14 1630) Temp Source: Oral (03/14 1329) BP: 128/56 mmHg (03/14 1630) Pulse Rate: 87 (03/14 1630) Intake/Output from previous day:   Intake/Output from this shift: Total I/O In: 350 [I.V.:350] Out: 1684 [Urine:200; [id number]; Blood:75]  Labs:  Recent Labs  11/01/2015 0345 11/06/2015 1340  WBC 31.5* 26.6*  HGB 10.0* 8.4*  PLT 441* 406*  CREATININE 5.30* 5.14*   Estimated Creatinine Clearance: 9.2 mL/min (by C-G formula based on Cr of 5.14). No results for input(s): VANCOTROUGH, VANCOPEAK, VANCORANDOM, GENTTROUGH, GENTPEAK, GENTRANDOM, TOBRATROUGH, TOBRAPEAK, TOBRARND, AMIKACINPEAK, AMIKACINTROU, AMIKACIN in the last 72 hours.   Microbiology: Recent Results (from the past 720 hour(s))  Surgical pcr screen     Status: None   Collection Time: 10/21/15  2:14 PM  Result Value Ref Range Status   MRSA, PCR NEGATIVE NEGATIVE Final   Staphylococcus aureus NEGATIVE NEGATIVE Final    Comment:        The Xpert SA Assay (FDA approved for NASAL specimens in patients over 35 years of age), is one component of a comprehensive surveillance program.  Test performance has been validated by [facility] for patients greater than or equal to 55 year old. It is not intended to diagnose infection nor to guide or monitor treatment.     Medical History: Past Medical History  Diagnosis Date  . Hypertension   . Heart failure [facility]) December 07, 2012    Right side  .  Hypercalcemia     2nd to hyperparathyroidism  . Thyroid disease     Hyperparathyroidism  . Thyroid disease     Hypothyroidism  . Dyslipidemia   . CHF (congestive heart failure) ([place])   . Myocardial infarction ([place]) 10/28/2010  . Peripheral vascular disease ([place])   . Short-term memory loss   . Stroke ([place]) 2010ish    no residual  . Shingles 2010 ish  . Anginal pain ([place])   . Coronary artery disease   . Diabetes mellitus without complication ([place])     TYPE 2  . Parathyroid cyst ([place])   . Headache(784.0)     CALCIFICATION LT SKULL  . Arthritis     Gout  . Cancer ([place])     kidney left removed  . Sleep apnea     Recently test in [place](NEG) NO CPAP   . ESRD (end stage renal disease) ([place])     dialysis T-TH-SAT  . Hypothyroidism   . Shortness of breath     with exertion     Medications:  Scheduled:  . aspirin  325 mg Oral Daily  . calcitRIOL  1 mcg Oral Q T,Th,Sa-HD  . [START ON 11/11/2015] calcium acetate  1,334 mg Oral TID WC  . calcium acetate  1,334 mg Oral TID WC  . cinacalcet  30 mg Oral Q supper  . docusate sodium  100 mg Oral BID  . feeding supplement (PRO-STAT SUGAR FREE 64)  30 mL Oral BID  . fenofibrate  54 mg Oral Daily  . fluconazole  100 mg Oral See admin instructions  . folic acid-vitamin b complex-vitamin c-selenium-zinc  1 tablet Oral Daily  . gabapentin  400 mg Oral TID  . heparin  5,000 Units Subcutaneous 3 times per day  . HYDROmorphone      . HYDROmorphone      . insulin aspart      . insulin aspart      . insulin aspart  0-22 Units Subcutaneous TID WC  . [START ON 11/11/2015] lanthanum  1,000 mg Oral TID AC  . lanthanum  1,000 mg Oral TID WC  . [START ON 11/11/2015] levothyroxine  88 mcg Oral QAC breakfast  . morphine      . morphine      . multivitamin  1 tablet Oral QHS  . pantoprazole  40 mg Oral Daily   Infusions:   Assessment: 81 yo female with wound infection will be continued on vancomycin and zosyn.  Patient has ESRD on HD TThSat.   Patient got one dose of zosyn 3.375g x1 at 0534 and vancomycin 1g x1 at 0336 today, and she had HD (3.5 hr ~400 BFR) today after those doses.  Goal of Therapy:  Pre-HD vancomycin level 15-25 mcg/mL  Plan:  - vancomycin 1g iv qTThSa, 1st dose today - zosyn 2.25 g iv q8h - monitor culture and sensitivity when available  Akito Boomhower, Tsz-[place] 10/29/2015,5:00 PM

## 2015-11-10 NOTE — Anesthesia Postprocedure Evaluation (Signed)
Anesthesia Post Note  Patient: [name]  Procedure(s) Performed: Procedure(s) (LRB): Debridment of right groin gas gangrene  (Right)  Patient location during evaluation: PACU Anesthesia Type: General Level of consciousness: sedated and patient cooperative Pain management: pain level controlled Vital Signs Assessment: post-procedure vital signs reviewed and stable Respiratory status: spontaneous breathing Cardiovascular status: stable Anesthetic complications: no    Last Vitals:  Filed Vitals:   11/12/2015 0945 11/04/2015 1000  BP: 120/50 140/58  Pulse: 92 75  Temp:    Resp: 15 12    Last Pain:  Filed Vitals:   11/18/2015 1011  PainSc: Asleep                 Nolon Nations

## 2015-11-10 NOTE — Procedures (Signed)
Dialysis post op debridement.  Moaning with discomfort on occasions. Goal 2000cc.  No hemodynamic issues.  BFR 400+ cc/min. Laci Frenkel C

## 2015-11-10 NOTE — ED Provider Notes (Addendum)
CSN: [id number]     Arrival date & time 11/07/2015  0244 History  By signing my name below, I, [name], attest that this documentation has been prepared under the direction and in the presence of [name], MD. Electronically Signed: [name], ED Scribe. 11/13/2015. 3:23 AM   No chief complaint on file.  The history is provided by the patient. No language interpreter was used.   Brought in by EMS, [name] is a 81 y.o. female with an extensive PMHx including: stroke, MI, DM, HTN, CHF, ESRD on hemodialysis, hypothyroidism, and peripheral vascular disease who presents to the Emergency Department complaining of constant, 10/10 in severity, cramping upper right leg pain with onset yesterday. She was discharged from the hospital 7 days ago after a right CFA endarterectomy with VPA and right femoral BK popliteal bypass with PTFE. Per EMS the pt was noted to have a fever up to 102 F and was last given Tylenol 3 hours ago. Pt denies abdominal pain and n/v/d. She does not wear oxygen at home. Pt last had dialysis 3 days ago.    I reviewed the patient's note. Discharge wounds were noted to be without evidence of infection. Original bypass graft was done in order to try to salvage patient's right lower extremity.  Past Medical History  Diagnosis Date  . Hypertension   . Heart failure [facility]) December 07, 2012    Right side  . Hypercalcemia     2nd to hyperparathyroidism  . Thyroid disease     Hyperparathyroidism  . Thyroid disease     Hypothyroidism  . Dyslipidemia   . CHF (congestive heart failure) ([place])   . Myocardial infarction ([place]) 10/28/2010  . Peripheral vascular disease ([place])   . Short-term memory loss   . Stroke ([place]) 2010ish    no residual  . Shingles 2010 ish  . Anginal pain ([place])   . Coronary artery disease   . Diabetes mellitus without complication ([place])     TYPE 2  . Parathyroid cyst ([place])   . Headache(784.0)     CALCIFICATION LT SKULL  . Arthritis      Gout  . Cancer ([place])     kidney left removed  . Sleep apnea     Recently test in [place](NEG) NO CPAP   . ESRD (end stage renal disease) ([place])     dialysis T-TH-SAT  . Hypothyroidism   . Shortness of breath     with exertion    Past Surgical History  Procedure Laterality Date  . Nephrectomy Left 2005  . Abdominal hysterectomy  2005  . Cholecystectomy  2005  . [place] stent    . Coronary stent placement Left 10/2010  . Av fistula placement Left 01/09/2013    Procedure: ARTERIOVENOUS (AV) FISTULA CREATION;  Surgeon: Mal Misty, MD;  Location: [place];  Service: Vascular;  Laterality: Left;  Creation of Left Brachial Cephalic Arteriovenous Fistula  . Bascilic vein transposition Left 07/30/2013    Procedure: 1ST Stage BASCILIC VEIN TRANSPOSITION;  Surgeon: Angelia Mould, MD;  Location: [place];  Service: Vascular;  Laterality: Left;  . Cardiac catheterization      2013 STENT PLACED  . Cataract extraction      BILAT  . Bascilic vein transposition Left 12/17/2013    Procedure: BASILIC VEIN TRANSPOSITION - LEFT ARM 2ND STAGE;  Surgeon: Angelia Mould, MD;  Location: [place];  Service: Vascular;  Laterality: Left;  . Insertion of dialysis catheter Left 03/04/2014  Procedure: INSERTION OF DIALYSIS CATHETER;  Surgeon: Conrad Alvordton, MD;  Location: [place];  Service: Vascular;  Laterality: Left;  Marland Kitchen Eye surgery Bilateral     cataract surgery  . Appendectomy    . Bascilic vein transposition Right 04/18/2014    Procedure: BASILIC VEIN TRANSPOSITION;  Surgeon: Angelia Mould, MD;  Location: [place];  Service: Vascular;  Laterality: Right;  . Ligation of arteriovenous  fistula Right 04/19/2014    Procedure: LIGATION OF ARTERIOVENOUS  FISTULA;  Surgeon: Serafina Mitchell, MD;  Location: [place];  Service: Vascular;  Laterality: Right;  . Av fistula placement Left 05/23/2014    Procedure: INSERTION OF ARTERIOVENOUS (AV) GORE-TEX GRAFT ARM;  Surgeon: Angelia Mould, MD;  Location:  [place];  Service: Vascular;  Laterality: Left;  . Shuntogram Left 02/10/2014    Procedure: FISTULOGRAM;  Surgeon: Angelia Mould, MD;  Location: [facility] CATH LAB;  Service: Cardiovascular;  Laterality: Left;  . Peripheral vascular catheterization N/A 10/12/2015    Procedure: Abdominal Aortogram w/Lower Extremity;  Surgeon: Angelia Mould, MD;  Location: [place] CV LAB;  Service: Cardiovascular;  Laterality: N/A;  . Peripheral vascular catheterization N/A 10/12/2015    Procedure: Upper Extremity Angiography/Right;  Surgeon: Angelia Mould, MD;  Location: [place] CV LAB;  Service: Cardiovascular;  Laterality: N/A;  . Femoral-popliteal bypass graft Right 10/27/2015    Procedure: BYPASS GRAFT FEMORAL-POPLITEAL ARTERY USING 6MM X 80CM GORE PROPATEN VASCULAR GRAFT;  Surgeon: Angelia Mould, MD;  Location: [place];  Service: Vascular;  Laterality: Right;  . Endarterectomy femoral Right 10/27/2015    Procedure: ENDARTERECTOMY RIGHT COMMON FEMORAL ARTERY;  Surgeon: Angelia Mould, MD;  Location: [place];  Service: Vascular;  Laterality: Right;  . Vein harvest Right 10/27/2015    Procedure: VEIN HARVEST RIGHT GREATER SAPHENOUS;  Surgeon: Angelia Mould, MD;  Location: [place];  Service: Vascular;  Laterality: Right;  . Patch angioplasty Right 10/27/2015    Procedure: VEIN PATCH ANGIOPLASTY RIGHT COMMON FEMORAL ARTERY;  Surgeon: Angelia Mould, MD;  Location: [place];  Service: Vascular;  Laterality: Right;  . Intraoperative arteriogram Right 10/27/2015    Procedure: INTRA OPERATIVE ARTERIOGRAM RIGHT LOWER LEG;  Surgeon: Angelia Mould, MD;  Location: [facility] OR;  Service: Vascular;  Laterality: Right;   Family History  Problem Relation Age of Onset  . Diabetes Sister   . Heart disease Sister     before age 41  . Hyperlipidemia Sister   . Hypertension Sister   . Diabetes Sister   . Hyperlipidemia Sister   . Hypertension Sister    Social History  Substance Use  Topics  . Smoking status: Former Smoker -- 46 years    Types: Cigarettes    Quit date: 01/08/1998  . Smokeless tobacco: Never Used  . Alcohol Use: No   OB History    No data available     Review of Systems  Constitutional: Positive for fever.  Respiratory: Negative for shortness of breath.   Gastrointestinal: Negative for nausea, vomiting, abdominal pain and diarrhea.  Genitourinary: Negative for dysuria.  Musculoskeletal:       Right leg pain  Skin: Positive for wound (right leg and right groin).  All other systems reviewed and are negative.  Allergies  Macrobid; Ace inhibitors; Angiotensin receptor blockers; and Influenza vaccines  Home Medications   Prior to Admission medications   Medication Sig Start Date End Date Taking? Authorizing Provider  acetaminophen (TYLENOL) 500 MG tablet Take 1,000  mg by mouth every 8 (eight) hours as needed for mild pain.    Yes Historical Provider, MD  albuterol (PROVENTIL HFA;VENTOLIN HFA) 108 (90 BASE) MCG/ACT inhaler Inhale 2 puffs into the lungs every 6 (six) hours as needed for wheezing or shortness of breath.   Yes Historical Provider, MD  albuterol (PROVENTIL) (2.5 MG/3ML) 0.083% nebulizer solution Take 2.5 mg by nebulization every 6 (six) hours as needed for wheezing or shortness of breath.    Yes Historical Provider, MD  aspirin 325 MG tablet Take 325 mg by mouth daily.   Yes Historical Provider, MD  calcium acetate (PHOSLO) 667 MG capsule Take 1,334 mg by mouth 3 (three) times daily with meals.  09/28/15  Yes Historical Provider, MD  cephALEXin (KEFLEX) 250 MG capsule Take 1 capsule (250 mg total) by mouth 3 (three) times daily. 10/12/15  Yes Angelia Mould, MD  cinacalcet (SENSIPAR) 30 MG tablet Take 30 mg by mouth daily with supper.   Yes Historical Provider, MD  docusate sodium (COLACE) 100 MG capsule Take 100 mg by mouth 2 (two) times daily.   Yes Historical Provider, MD  fenofibrate 54 MG tablet Take 54 mg by mouth daily.   05/06/14  Yes Historical Provider, MD  fluconazole (DIFLUCAN) 100 MG tablet Take 100 mg by mouth See admin instructions. On Monday, Wednesday, Friday and Sunday until 11/13/15   Yes Historical Provider, MD  folic acid-vitamin b complex-vitamin c-selenium-zinc (DIALYVITE) 3 MG TABS tablet Take 1 tablet by mouth daily.   Yes Historical Provider, MD  FOSRENOL 1000 MG chewable tablet Chew 1,000 mg by mouth 3 (three) times daily before meals. 09/17/15  Yes Historical Provider, MD  gabapentin (NEURONTIN) 400 MG capsule Take 400 mg by mouth 3 (three) times daily.   Yes Historical Provider, MD  insulin aspart (NOVOLOG) 100 UNIT/ML injection Inject 0-22 Units into the skin 3 (three) times daily with meals. CBG: 0-70= 0 units 71-100=9 units after meal 101-150=14 before meal 151-200=16 units before meal 201-250=17 units before meal 251-300=18 units before meal 301-350=19 units before meal 351-400=20 units before meal Over 400=22 units before meal 03/08/14  Yes Hosie Poisson, MD  levothyroxine (SYNTHROID, LEVOTHROID) 88 MCG tablet Take 88 mcg by mouth daily before breakfast.   Yes Historical Provider, MD  nitroGLYCERIN (NITROSTAT) 0.4 MG SL tablet Place 0.4 mg under the tongue every 5 (five) minutes as needed for chest pain.   Yes Historical Provider, MD  Omega-3 Fatty Acids (FISH OIL) 1000 MG CAPS Take 1,000 mg by mouth 2 (two) times daily.   Yes Historical Provider, MD  oxyCODONE-acetaminophen (PERCOCET/ROXICET) 5-325 MG tablet Take 1-2 tablets by mouth every 4 (four) hours as needed for moderate pain. 11/03/15  Yes Ulyses Amor, PA-C  cephALEXin (KEFLEX) 500 MG capsule Take 1 capsule (500 mg total) by mouth daily. Patient not taking: Reported on 11/09/2015 11/03/15   Ulyses Amor, PA-C   BP 176/49 mmHg  Pulse 87  Temp(Src) 101.4 F (38.6 C) (Rectal)  Resp 20  SpO2 91% Physical Exam  Constitutional: She is oriented to person, place, and time. No distress.  Morbidly obese, chronic ill-appearing  HENT:   Head: Normocephalic and atraumatic.  Cardiovascular: Regular rhythm and normal heart sounds.   Tachycardia  Pulmonary/Chest: Effort normal and breath sounds normal. No respiratory distress. She has no wheezes.  Abdominal: Soft. Bowel sounds are normal. There is tenderness. There is no rebound and no guarding.  Incision just superior of the right inguinal region approximately 8 cm  with gross purulent drainage and bubbling noted, adjacent erythema and crepitus noted  Musculoskeletal: She exhibits edema.  Bilateral lower extremity edema, incisions noted over the medial calf and thigh clean dry and intact, unable to palpate pulses bilaterally  Neurological: She is alert and oriented to person, place, and time.  Skin: Skin is warm and dry.  Dry gangrene noted of the right great toe  Psychiatric: She has a normal mood and affect.  Nursing note and vitals reviewed.   ED Course  Procedures (including critical care time)  CRITICAL CARE Performed by: [name]   Total critical care time: 30 minutes  Critical care time was exclusive of separately billable procedures and treating other patients.  Critical care was necessary to treat or prevent imminent or life-threatening deterioration.  Critical care was time spent personally by me on the following activities: development of treatment plan with patient and/or surrogate as well as nursing, discussions with consultants, evaluation of patient's response to treatment, examination of patient, obtaining history from patient or surrogate, ordering and performing treatments and interventions, ordering and review of laboratory studies, ordering and review of radiographic studies, pulse oximetry and re-evaluation of patient's condition.   COORDINATION OF CARE: 3:10 AM Discussed treatment plan which includes lab work, CXR, morphine, and vancomycin with pt at bedside and pt agreed to plan.  3:24 AM-Consult complete with Dr. Donnetta Hutching [facility]  Surgery). Patient case explained and discussed. Call ended at 3:26 AM  Labs Review Labs Reviewed  CBC WITH DIFFERENTIAL/PLATELET - Abnormal; Notable for the following:    WBC 31.5 (*)    RBC 3.42 (*)    Hemoglobin 10.0 (*)    HCT 30.5 (*)    Platelets 441 (*)    Neutro Abs 29.3 (*)    Lymphs Abs 0.6 (*)    Monocytes Absolute 1.6 (*)    All other components within normal limits  COMPREHENSIVE METABOLIC PANEL - Abnormal; Notable for the following:    Sodium 131 (*)    Chloride 90 (*)    CO2 21 (*)    Glucose, Bld 284 (*)    BUN 46 (*)    Creatinine, Ser 5.30 (*)    Total Protein 6.4 (*)    Albumin 1.9 (*)    ALT 12 (*)    GFR calc non Af Amer 7 (*)    GFR calc Af Amer 8 (*)    Anion gap 20 (*)    All other components within normal limits  GLUCOSE, CAPILLARY - Abnormal; Notable for the following:    Glucose-Capillary 328 (*)    All other components within normal limits  URINE CULTURE  CULTURE, BLOOD (ROUTINE X 2)  CULTURE, BLOOD (ROUTINE X 2)  ANAEROBIC CULTURE  CULTURE, ROUTINE-ABSCESS  URINALYSIS, ROUTINE W REFLEX MICROSCOPIC (NOT AT [facility])  I-STAT CG4 LACTIC ACID, ED  I-STAT CG4 LACTIC ACID, ED    Imaging Review Ct Pelvis W Contrast  11/12/2015  CLINICAL DATA:  Concern for postoperative wound infection. 10/27/2015 right inguinal surgery with bypass and common femoral artery endarterectomy. EXAM: CT PELVIS WITH CONTRAST TECHNIQUE: Multidetector CT imaging of the pelvis was performed using the standard protocol following the bolus administration of intravenous contrast. CONTRAST:  38mL OMNIPAQUE IOHEXOL 300 MG/ML  SOLN COMPARISON:  None available FINDINGS: There is soft tissue emphysema clustered in the right groin and lower quadrant subcutaneous fat, extending to but not involving the abdominal wall musculature. There is no measurable fluid collection. No interfascial gas or stranding. Cellulitis surrounds  the emphysematous area. No intra-abdominal inflammatory changes are  noted. Extensive diverticulosis. Patient's right common femoral artery bypass graft is noted. These results were called by telephone at the time of interpretation on 11/23/2015 at 5:00 am to Dr. Thayer Jew , who verbally acknowledged these results. IMPRESSION: Soft tissue emphysema/necrotizing infection in the right groin and lower quadrant subcutaneous fat. Electronically Signed   By: Monte Fantasia M.D.   On: 11/27/2015 04:59   Dg Chest Portable 1 View  11/04/2015  CLINICAL DATA:  Fever and right leg pain EXAM: PORTABLE CHEST 1 VIEW COMPARISON:  05/23/2014 FINDINGS: Dialysis catheter from the right with tip at the upper right atrium. There is chronic cardiomegaly, accentuated by rotation and mediastinal fat pad. Calcified mediastinal lymph nodes. Artifact from EKG leads. Chronic interstitial coarsening which is bronchitic appearing. There is no edema, consolidation, effusion, or pneumothorax. IMPRESSION: Chronic bronchitic markings.  No focal pneumonia. Electronically Signed   By: Monte Fantasia M.D.   On: 11/19/2015 03:42   I have personally reviewed and evaluated these images and lab results as part of my medical decision-making.   EKG Interpretation   Date/Time:  Tuesday November 10 2015 02:58:54 EDT Ventricular Rate:  87 PR Interval:  280 QRS Duration: 90 QT Interval:  364 QTC Calculation: 438 R Axis:   52 Text Interpretation:  Sinus rhythm Prolonged PR interval Borderline  repolarization abnormality Baseline wander in lead(s) V2 V6 Confirmed by  HORTON  MD, COURTNEY ([id number]) on 10/31/2015 9:36:07 AM      MDM   Final diagnoses:  Necrotizing fasciitis ([place])    Patient presents with fever and right leg pain. Recent bypass graft. She is febrile to 101.4. Otherwise nontoxic. She is on dialysis and due for dialysis later today.  I'm concerned that she has a wound infection given the appearance of her graft wound. White count is 31.  Lactate is normal and she is not hypotensive. Given  that the patient appears volume overloaded and she is on dialysis, will hold fluids at this time. She was empirically given vancomycin and IV Zosyn. I discussed the patient with Dr. Donnetta Hutching and will obtain CT scan of the pelvis with contrast.  CT scan concerning for necrotizing infection of the incision site. I have updated Dr. early. He will take patient to the OR.  I personally performed the services described in this documentation, which was scribed in my presence. The recorded information has been reviewed and is accurate.    [name], MD 11/03/2015 [id number]  [name], MD 11/03/2015 [place], MD 11/13/2015 [phone]

## 2015-11-11 ENCOUNTER — Encounter: Payer: Self-pay | Admitting: Vascular Surgery

## 2015-11-11 ENCOUNTER — Encounter (HOSPITAL_COMMUNITY): Payer: Self-pay | Admitting: Vascular Surgery

## 2015-11-11 DIAGNOSIS — B9689 Other specified bacterial agents as the cause of diseases classified elsewhere: Secondary | ICD-10-CM

## 2015-11-11 LAB — BASIC METABOLIC PANEL
ANION GAP: 12 (ref 5–15)
BUN: 26 mg/dL — ABNORMAL HIGH (ref 6–20)
CALCIUM: 8.6 mg/dL — AB (ref 8.9–10.3)
CO2: 26 mmol/L (ref 22–32)
Chloride: 94 mmol/L — ABNORMAL LOW (ref 101–111)
Creatinine, Ser: 3.4 mg/dL — ABNORMAL HIGH (ref 0.44–1.00)
GFR, EST AFRICAN AMERICAN: 14 mL/min — AB (ref >60)
GFR, EST NON AFRICAN AMERICAN: 12 mL/min — AB (ref >60)
Glucose, Bld: 250 mg/dL — ABNORMAL HIGH (ref 65–99)
Potassium: 4.1 mmol/L (ref 3.5–5.1)
SODIUM: 132 mmol/L — AB (ref 135–145)

## 2015-11-11 LAB — CBC
HEMATOCRIT: 28.4 % — AB (ref 36.0–46.0)
HEMOGLOBIN: 8.7 g/dL — AB (ref 12.0–15.0)
MCH: 27.8 pg (ref 26.0–34.0)
MCHC: 30.6 g/dL (ref 30.0–36.0)
MCV: 90.7 fL (ref 78.0–100.0)
Platelets: 379 10*3/uL (ref 150–400)
RBC: 3.13 MIL/uL — ABNORMAL LOW (ref 3.87–5.11)
RDW: 14.8 % (ref 11.5–15.5)
WBC: 21.8 10*3/uL — AB (ref 4.0–10.5)

## 2015-11-11 LAB — GLUCOSE, CAPILLARY
GLUCOSE-CAPILLARY: 216 mg/dL — AB (ref 65–99)
GLUCOSE-CAPILLARY: 111 mg/dL — AB (ref 65–99)
GLUCOSE-CAPILLARY: 112 mg/dL — AB (ref 65–99)
GLUCOSE-CAPILLARY: 53 mg/dL — AB (ref 65–99)
GLUCOSE-CAPILLARY: 54 mg/dL — AB (ref 65–99)
GLUCOSE-CAPILLARY: 110 mg/dL — AB (ref 65–99)
GLUCOSE-CAPILLARY: 52 mg/dL — AB (ref 65–99)

## 2015-11-11 MED ORDER — CETYLPYRIDINIUM CHLORIDE 0.05 % MT LIQD
7.0000 mL | Freq: Two times a day (BID) | OROMUCOSAL | Status: DC
  Administered 2015-11-11 – 2015-11-17 (×6): 7 mL via OROMUCOSAL

## 2015-11-11 MED ORDER — DEXTROSE 50 % IV SOLN
INTRAVENOUS | Status: AC
Start: 2015-11-11 — End: 2015-11-11
  Administered 2015-11-11: 25 mL via INTRAVENOUS
  Filled 2015-11-11: qty 50

## 2015-11-11 NOTE — Clinical Documentation Improvement (Signed)
Nephrology/Renal  Abnormal Lab/Test Results:   Component     Latest Ref Rng 11/27/2015 10/31/2015 11/04/2015 11/11/2015         3:45 AM  1:40 PM  6:30 PM   Sodium     135 - 145 mmol/L 131 (L) 132 (L)  132 (L)  Chloride     101 - 111 mmol/L 90 (L) 94 (L)  94 (L)    Possible Clinical Conditions associated with below indicators  Hyponatremia  Other Condition  Cannot Clinically Determine   Supporting Information: surgery, dialysis, sepsis Treatment Provided: 0.9% NaCl  IVF bolus prn cramping   Please exercise your independent, professional judgment when responding. A specific answer is not anticipated or expected. Please update your documentation within the medical record to reflect your response to this query. Thank you  Thank You,  [place] [phone]

## 2015-11-11 NOTE — Clinical Documentation Improvement (Signed)
Vascular Surgery  Please further clarify procedure documentation in the medical record of "debridement."   Type - excisional, non-excisional  Specify the type of instrument used, ex. scalpel, scissors, currette, etc.  Other condition  Clinically Undetermined   Supporting Information: :  OR note: Extensive debridement of right groin    Please exercise your independent, professional judgment when responding. A specific answer is not anticipated or expected. Please update your documentation within the medical record to reflect your response to this query. Thank you  Thank You, [place] [phone]

## 2015-11-11 NOTE — Consult Note (Addendum)
WOC consult requested for Vac change tomorrow to right groin with VVS team at the bedside to assess the wound during the first post-op dressing change.  Will perform on Thurs AM as requested. Julien Girt MSN, RN, [place], [place], [place]

## 2015-11-11 NOTE — Progress Notes (Signed)
Utilization review completed.  

## 2015-11-11 NOTE — Progress Notes (Addendum)
[facility] Progress Note  VASCULAR SURGERY ASSESSMENT AND PLAN:  This patient had presented with critical limb ischemia of the right lower extremity with gangrene of the right foot. Her arteriogram showed no significant iliac artery occlusive disease on the right although she had an extensive bulky plaque in the right common femoral artery and occlusion of the superficial femoral artery and popliteal artery. She underwent extensive right common femoral artery endarterectomy with vein patch angioplasty and a right femoral to below knee popliteal artery bypass with a 6 mm PTFE graft. I did harvest her right great saphenous vein but it was not adequate in size to use as a bypass conduit. She has end-stage renal disease, diabetes, and morbid obesity and developed an extensive wound infection at the skilled nursing facility. Yesterday, Dr. Donnetta Hutching performed extensive debridement of the right groin and placement of a VAC. The infection was in the subcutaneous tissue and did not appear to involve the graft. She was noted to have a mottled right lower extremities today with an occluded graft.  On exam she has no motor or sensory function in the right foot. Mottling extends up to the groin. Currently her pain appears to be adequately controlled. I had a long discussion with her son Mitzi Hansen this morning. The family has had extensive discussion and are considering Palliative Care given her age and multiple comorbidities. At this point, they do not want any further surgery. I think that this is perfectly reasonable given the situation. Her only surgical option would be a high above-the-knee amputation and removal of the graft. My 2 primary concerns would be nonhealing of the AKA given the extent of the mottling, although the preoperative arteriogram did not show any significant inflow disease on the right. A second concern is obviously the risk of wound healing problems given her morbid obesity and  diabetes. Given all these reasons I think the family's decision is perfectly reasonable. Her son Mitzi Hansen will be in to discuss this with her tonight and then hopefully we can get palliative care involved in the near future. This point our primary concern is pain control. She will continue dialysis with her tunneled dialysis catheter.  [name], MD, FACS [place] [phone] Office: [phone]    Subjective  - POD #1  Some numbness with right foot. Says pain is ok.   Objective Filed Vitals:   11/25/2015 2134 11/11/15 0528  BP: 133/42 137/49  Pulse: 80 89  Temp: 97.7 F (36.5 C) 97.7 F (36.5 C)  Resp: 18 16   Tmax 98.2 BP sys 80s-140s 02 98% 3L  Intake/Output Summary (Last 24 hours) at 11/11/15 0838 Last data filed at 11/11/15 0644  Gross per 24 hour  Intake      0 ml  Output   1644 ml  Net  -1644 ml    Right groin VAC intact. Right leg is mottled and cool from above knee down to foot. No sensation or motor function right foot.   Assessment/Planning: 81 y.o. female is s/p: extensive debridement of right groin, gas gangrene infection right groin 1 Day Post-Op   Right leg more mottled today. Pain controlled. Bypass graft is occluded. Will likely need AKA in near future.  Plan for [facility] change tomorrow. Will evaluate wound tomorrow. May need further debridement in OR.  WBC trending down. Continue vanc/zosyn. Appreciate ID following.  HD yesterday. Appreciate nephrology following.   [place]via [place] 11/11/2015 8:38 AM --  Laboratory CBC    Component Value Date/Time  WBC 21.8* 11/11/2015 0320   HGB 8.7* 11/11/2015 0320   HCT 28.4* 11/11/2015 0320   PLT 379 11/11/2015 0320    BMET    Component Value Date/Time   NA 132* 11/11/2015 0320   K 4.1 11/11/2015 0320   CL 94* 11/11/2015 0320   CO2 26 11/11/2015 0320   GLUCOSE 250* 11/11/2015 0320   BUN 26* 11/11/2015 0320   CREATININE 3.40* 11/11/2015 0320   CALCIUM 8.6* 11/11/2015 0320   GFRNONAA 12*  11/11/2015 0320   GFRAA 14* 11/11/2015 0320    COAG Lab Results  Component Value Date   INR 1.08 10/21/2015   INR 0.93 04/18/2014   INR 0.94 01/09/2013   No results found for: PTT  Antibiotics Anti-infectives    Start     Dose/Rate Route Frequency Ordered Stop   11/12/15 1200  vancomycin (VANCOCIN) IVPB 1000 mg/200 mL premix     1,000 mg 200 mL/hr over 60 Minutes Intravenous Every T-Th-Sa (Hemodialysis) 11/24/2015 1703     11/11/15 1000  fluconazole (DIFLUCAN) tablet 100 mg     100 mg Oral Once per day on Sun Mon Wed Fri 11/05/2015 1657 11/15/15 0959   11/21/2015 1800  piperacillin-tazobactam (ZOSYN) IVPB 2.25 g     2.25 g 100 mL/hr over 30 Minutes Intravenous Every 8 hours 11/21/2015 1703     10/31/2015 1800  vancomycin (VANCOCIN) IVPB 1000 mg/200 mL premix     1,000 mg 200 mL/hr over 60 Minutes Intravenous NOW 11/26/2015 1703 11/22/2015 2241   11/20/2015 0515  piperacillin-tazobactam (ZOSYN) IVPB 3.375 g     3.375 g 100 mL/hr over 30 Minutes Intravenous  Once 11/16/2015 0503 11/08/2015 0604   11/16/2015 0315  vancomycin (VANCOCIN) IVPB 1000 mg/200 mL premix     1,000 mg 200 mL/hr over 60 Minutes Intravenous  Once 11/07/2015 0313 11/25/2015 [place], PA-C [facility] Office: [phone] Pager: [phone] 11/11/2015 8:38 AM

## 2015-11-11 NOTE — Progress Notes (Signed)
[facility] for Infectious Disease    Date of Admission:  11/21/2015   Total days of antibiotics 2 Vancomycin 3/14 >> Piperacillin-Tazobactam 3/14>>  Active Problems:   Abscess of right groin   . aspirin  325 mg Oral Daily  . calcitRIOL  1 mcg Oral Q T,Th,Sa-HD  . calcium acetate  1,334 mg Oral TID WC  . cinacalcet  30 mg Oral Q supper  . docusate sodium  100 mg Oral BID  . feeding supplement (PRO-STAT SUGAR FREE 64)  30 mL Oral BID  . fenofibrate  54 mg Oral Daily  . fluconazole  100 mg Oral Once per day on Sun Mon Wed Fri  . gabapentin  400 mg Oral TID  . heparin  5,000 Units Subcutaneous 3 times per day  . insulin aspart  0-22 Units Subcutaneous TID WC  . lanthanum  1,000 mg Oral TID WC  . levothyroxine  88 mcg Oral QAC breakfast  . multivitamin  1 tablet Oral QHS  . pantoprazole  40 mg Oral Daily  . piperacillin-tazobactam (ZOSYN)  IV  2.25 g Intravenous Q8H  . [START ON 11/12/2015] vancomycin  1,000 mg Intravenous Q T,Th,Sa-HD    SUBJECTIVE: Ms. Cormany is lying in bed and is tired.  She says she is having burning pain, numbness, and tingling of her right leg.  Review of Systems: Review of Systems  Constitutional: Negative for fever and chills.  Respiratory: Negative for cough.   Cardiovascular: Negative for chest pain.  Gastrointestinal: Negative for nausea, vomiting and abdominal pain.  Skin:       Necrotizing wound right groin.    Past Medical History  Diagnosis Date  . Hypertension   . Heart failure [facility]) December 07, 2012    Right side  . Hypercalcemia     2nd to hyperparathyroidism  . Thyroid disease     Hyperparathyroidism  . Thyroid disease     Hypothyroidism  . Dyslipidemia   . CHF (congestive heart failure) ([place])   . Myocardial infarction ([place]) 10/28/2010  . Peripheral  vascular disease ([place])   . Short-term memory loss   . Stroke ([place]) 2010ish    no residual  . Shingles 2010 ish  . Anginal pain ([place])   . Coronary artery disease   . Diabetes mellitus without complication ([place])     TYPE 2  . Parathyroid cyst ([place])   . Headache(784.0)     CALCIFICATION LT SKULL  . Arthritis     Gout  . Cancer ([place])     kidney left removed  . Sleep apnea     Recently test in [place](NEG) NO CPAP   . ESRD (end stage renal disease) ([place])     dialysis T-TH-SAT  . Hypothyroidism   . Shortness of breath     with exertion     Social History  Substance Use Topics  . Smoking status: Former Smoker -- 46 years    Types: Cigarettes    Quit date: 01/08/1998  . Smokeless tobacco: Never Used  . Alcohol Use: No    Family History  Problem Relation Age of Onset  . Diabetes Sister   . Heart disease Sister     before age 54  . Hyperlipidemia Sister   . Hypertension Sister   . Diabetes Sister   . Hyperlipidemia Sister   . Hypertension Sister    Allergies  Allergen Reactions  . Macrobid [Nitrofurantoin Macrocrystal] Anaphylaxis and Rash  . Ace  Inhibitors Other (See Comments)    hyperkalemia  . Angiotensin Receptor Blockers Other (See Comments)    hyperkalemia  . Influenza Vaccines Itching, Rash and Other (See Comments)    blisters    OBJECTIVE: Filed Vitals:   11/26/2015 1630 11/03/2015 1701 11/16/2015 2134 11/11/15 0528  BP: 128/56 140/72 133/42 137/49  Pulse: 87 87 80 89  Temp: 98 F (36.7 C) 97.9 F (36.6 C) 97.7 F (36.5 C) 97.7 F (36.5 C)  TempSrc:  Oral Oral Oral  Resp: 14 16 18 16   Weight: 205 lb 0.4 oz (93 kg)     SpO2: 99% 100% 97% 98%   Body mass index is 40.04 kg/(m^2).  Physical Exam  GENERAL- Tired appearing female, lying in bed, wearing supplemental oxygen via nasal cannula HEENT- atraumatic, normocephalic CARDIAC- RRR, 2/6 systolic murmur RESP- clear to ausculation anterior fields, normal work of breathing ABDOMEN- obese, soft,  nontender, no guarding or rebound NEURO- alert and oriented EXTREMITIES- right leg is cool to the touch, mottled appearing, with surgical scars from previous surgery.  She has decreased sensation to right leg and a necrotic appearing great toe.  She has a large wound to her right groin that has a wound vac attached.  Lab Results Lab Results  Component Value Date   WBC 21.8* 11/11/2015   HGB 8.7* 11/11/2015   HCT 28.4* 11/11/2015   MCV 90.7 11/11/2015   PLT 379 11/11/2015    Lab Results  Component Value Date   CREATININE 3.40* 11/11/2015   BUN 26* 11/11/2015   NA 132* 11/11/2015   K 4.1 11/11/2015   CL 94* 11/11/2015   CO2 26 11/11/2015    Lab Results  Component Value Date   ALT 12* 11/21/2015   AST 17 11/13/2015   ALKPHOS 98 11/09/2015   BILITOT 0.8 11/13/2015     Microbiology: Recent Results (from the past 240 hour(s))  Anaerobic culture     Status: None (Preliminary result)   Collection Time: 10/29/2015  7:29 AM  Result Value Ref Range Status   Specimen Description ABSCESS RIGHT GROIN  Final   Special Requests NONE  Final   Gram Stain   Final    MODERATE WBC PRESENT, PREDOMINANTLY PMN NO SQUAMOUS EPITHELIAL CELLS SEEN MODERATE GRAM POSITIVE COCCI IN PAIRS RARE GRAM NEGATIVE RODS Performed at [facility]    Culture PENDING  Incomplete   Report Status PENDING  Incomplete  Culture, routine-abscess     Status: None (Preliminary result)   Collection Time: 11/25/2015  7:29 AM  Result Value Ref Range Status   Specimen Description ABSCESS RIGHT GROIN  Final   Special Requests NONE  Final   Gram Stain   Final    MODERATE WBC PRESENT, PREDOMINANTLY PMN NO SQUAMOUS EPITHELIAL CELLS SEEN MODERATE GRAM POSITIVE COCCI IN PAIRS RARE GRAM NEGATIVE RODS Performed at [facility]    Culture PENDING  Incomplete   Report Status PENDING  Incomplete     ASSESSMENT: 81 year old woman with extensive past medical history who is here after development of a  necrotizing soft tissue infection of her right groin wound.  This was following a recent right sided femoral to popliteal bypass graft.  Surgical cultures obtained yesterday are showing GPC in pairs and GNR's.  Culture is pending.  PLAN: 1. Continue vancomycin and piperacillin-tazobactam pending culture report  Jule Ser, DO   11/11/2015, 11:40 AM   Addendum: I have seen and examined Ms. Izola Price I discussed her care with Dr.  Juleen China. She has a polymicrobial right groin infection. We will continue vancomycin and piperacillin tazobactam pending culture results and further discussion about goals of care.  Michel Bickers, MD [facility] for Infectious [place] [facility] [phone] pager   [phone] cell 11/11/2015, 1:11 PM

## 2015-11-11 NOTE — Progress Notes (Signed)
Inpatient Diabetes Program Recommendations  AACE/ADA: New Consensus Statement on Inpatient Glycemic Control (2015)  Target Ranges:  Prepandial:   less than 140 mg/dL      Peak postprandial:   less than 180 mg/dL (1-2 hours)      Critically ill patients:  140 - 180 mg/dL   Review of Glycemic Control  Inpatient Diabetes Program Recommendations:  Insulin - Basal: add Lantus or Levemir 10 units at HS Thank you  Raoul Pitch BSN, RN,CDE Inpatient Diabetes Coordinator [phone] (team pager)

## 2015-11-11 NOTE — Clinical Social Work Note (Signed)
Clinical Social Work Assessment  Patient Details  Name: [name] MRN: [id number] Date of Birth: [birth year]  Date of referral:  11/11/15               Reason for consult:  Facility Placement                Permission sought to share information with:  Facility Sport and exercise psychologist, Family Supports Permission granted to share information::   (Patient asleep; completed assessment with daughter and son)  Name::     [name]  Agency::  [facility] [place] SNFs  Relationship::  son  Contact Information:  [phone]  Housing/Transportation Living arrangements for the past 2 months:  [place], [place] of Information:  Adult Children Patient Interpreter Needed:  None Criminal Activity/Legal Involvement Pertinent to Current Situation/Hospitalization:  No - Comment as needed Significant Relationships:  Adult Children Lives with:  Self Do you feel safe going back to the place where you live?  No Need for family participation in patient care:  Yes (Comment)  Care giving concerns:  CSW received referral for possible SNF placement at time of discharge. CSW met with patient's son and daughter at bedside while patient was asleep regarding discharge planning. Per patient's son, patient is currently considering medical options regarding whether or not to amputate. Patient was at [facility] prior to hospital admission. Patient's son reported that he chose not to do a bed hold at the facility but may decide to go back there once patient becomes stable. CSW to continue to follow and assist with discharge planning needs.   Social Worker assessment / plan:  CSW spoke with patient's son and daughter concerning possibility of rehab at [facility] before returning home.  Employment status:  Retired Nurse, adult PT Recommendations:  Not assessed at this time Information / Referral to community resources:  [place]  Patient/Family's Response to care:  Patient's son and daughter recognize need for rehab before returning home and may be agreeable to returning to SNF once the medical decisions are made. Patient's son reports being very concerned about the decision of whether or not to amputate the patient's leg.  Patient/Family's Understanding of and Emotional Response to Diagnosis, Current Treatment, and Prognosis:  Patient is realistic regarding therapy needs. No questions/concerns about plan or treatment.    Emotional Assessment Appearance:  Appears stated age Attitude/Demeanor/Rapport:  Unable to Assess Affect (typically observed):  Unable to Assess Orientation:  Oriented to Self, Oriented to Place, Oriented to  Time, Oriented to Situation Alcohol / Substance use:  Not Applicable Psych involvement (Current and /or in the community):  No (Comment)  Discharge Needs  Concerns to be addressed:  Care Coordination Readmission within the last 30 days:  Yes Current discharge risk:  None Barriers to Discharge:  Continued Medical Work up   [employer], [place] 11/11/2015, 5:14 PM

## 2015-11-11 NOTE — Progress Notes (Signed)
Assessment/Plan: 1. Sepsis/Debridement of R. Groin:   2. ESRD - TTS @ [facility]. K+ 4.5 2.0 K 2.25 Ca bath. EDW 873.30 3. Hypertension/volume - stable  Subjective: Interval History: c/o pain in surgical region  Objective: Vital signs in last 24 hours: Temp:  [97.7 F (36.5 C)-98.2 F (36.8 C)] 97.7 F (36.5 C) (03/15 0528) Pulse Rate:  [69-96] 89 (03/15 0528) Resp:  [14-20] 16 (03/15 0528) BP: (87-140)/(42-83) 137/49 mmHg (03/15 0528) SpO2:  [93 %-100 %] 98 % (03/15 0528) Weight:  [93 kg (205 lb 0.4 oz)-94.348 kg (208 lb)] 93 kg (205 lb 0.4 oz) (03/14 1630) Weight change:   Intake/Output from previous day: 03/14 0701 - 03/15 0700 In: 350 [I.V.:350] Out: 1844 [Urine:200; Drains:160; Blood:75] Intake/Output this shift: Total I/O In: 240 [P.O.:240] Out: -   General appearance: alert and cooperative Resp: clear to auscultation bilaterally Extremities: RLE acral cyanosis  Lab Results:  Recent Labs  11/11/2015 1830 11/11/15 0320  WBC 27.4* 21.8*  HGB 9.1* 8.7*  HCT 28.0* 28.4*  PLT 386 379   BMET:  Recent Labs  11/25/2015 1340 11/09/2015 1830 11/11/15 0320  NA 132*  --  132*  K 4.1  --  4.1  CL 94*  --  94*  CO2 24  --  26  GLUCOSE 271*  --  250*  BUN 52*  --  26*  CREATININE 5.14* 2.69* 3.40*  CALCIUM 8.8*  --  8.6*   No results for input(s): PTH in the last 72 hours. Iron Studies: No results for input(s): IRON, TIBC, TRANSFERRIN, FERRITIN in the last 72 hours. Studies/Results: Ct Pelvis W Contrast  11/06/2015  CLINICAL DATA:  Concern for postoperative wound infection. 10/27/2015 right inguinal surgery with bypass and common femoral artery endarterectomy. EXAM: CT PELVIS WITH CONTRAST TECHNIQUE: Multidetector CT imaging of the pelvis was performed using the standard protocol following the bolus administration of intravenous contrast. CONTRAST:  59mL OMNIPAQUE IOHEXOL 300 MG/ML  SOLN COMPARISON:  None available FINDINGS: There is soft tissue emphysema clustered  in the right groin and lower quadrant subcutaneous fat, extending to but not involving the abdominal wall musculature. There is no measurable fluid collection. No interfascial gas or stranding. Cellulitis surrounds the emphysematous area. No intra-abdominal inflammatory changes are noted. Extensive diverticulosis. Patient's right common femoral artery bypass graft is noted. These results were called by telephone at the time of interpretation on 10/29/2015 at 5:00 am to Dr. Thayer Jew , who verbally acknowledged these results. IMPRESSION: Soft tissue emphysema/necrotizing infection in the right groin and lower quadrant subcutaneous fat. Electronically Signed   By: Monte Fantasia M.D.   On: 10/31/2015 04:59   Dg Chest Portable 1 View  10/30/2015  CLINICAL DATA:  Fever and right leg pain EXAM: PORTABLE CHEST 1 VIEW COMPARISON:  05/23/2014 FINDINGS: Dialysis catheter from the right with tip at the upper right atrium. There is chronic cardiomegaly, accentuated by rotation and mediastinal fat pad. Calcified mediastinal lymph nodes. Artifact from EKG leads. Chronic interstitial coarsening which is bronchitic appearing. There is no edema, consolidation, effusion, or pneumothorax. IMPRESSION: Chronic bronchitic markings.  No focal pneumonia. Electronically Signed   By: Monte Fantasia M.D.   On: 10/29/2015 03:42    Scheduled: . aspirin  325 mg Oral Daily  . calcitRIOL  1 mcg Oral Q T,Th,Sa-HD  . calcium acetate  1,334 mg Oral TID WC  . cinacalcet  30 mg Oral Q supper  . docusate sodium  100 mg Oral BID  . feeding supplement (PRO-STAT  SUGAR FREE 64)  30 mL Oral BID  . fenofibrate  54 mg Oral Daily  . fluconazole  100 mg Oral Once per day on Sun Mon Wed Fri  . gabapentin  400 mg Oral TID  . heparin  5,000 Units Subcutaneous 3 times per day  . insulin aspart  0-22 Units Subcutaneous TID WC  . lanthanum  1,000 mg Oral TID WC  . levothyroxine  88 mcg Oral QAC breakfast  . multivitamin  1 tablet Oral QHS   . pantoprazole  40 mg Oral Daily  . piperacillin-tazobactam (ZOSYN)  IV  2.25 g Intravenous Q8H  . [START ON 11/12/2015] vancomycin  1,000 mg Intravenous Q T,Th,Sa-HD    LOS: 1 day   Taran Hable C 11/11/2015,10:29 AM

## 2015-11-12 LAB — BASIC METABOLIC PANEL
ANION GAP: 15 (ref 5–15)
BUN: 44 mg/dL — ABNORMAL HIGH (ref 6–20)
CHLORIDE: 90 mmol/L — AB (ref 101–111)
CO2: 23 mmol/L (ref 22–32)
Calcium: 8.7 mg/dL — ABNORMAL LOW (ref 8.9–10.3)
Creatinine, Ser: 4.52 mg/dL — ABNORMAL HIGH (ref 0.44–1.00)
GFR, EST AFRICAN AMERICAN: 10 mL/min — AB (ref >60)
GFR, EST NON AFRICAN AMERICAN: 8 mL/min — AB (ref >60)
Glucose, Bld: 160 mg/dL — ABNORMAL HIGH (ref 65–99)
POTASSIUM: 5.3 mmol/L — AB (ref 3.5–5.1)
SODIUM: 128 mmol/L — AB (ref 135–145)

## 2015-11-12 LAB — RENAL FUNCTION PANEL
ALBUMIN: 1.6 g/dL — AB (ref 3.5–5.0)
ANION GAP: 19 — AB (ref 5–15)
BUN: 52 mg/dL — ABNORMAL HIGH (ref 6–20)
CALCIUM: 8.4 mg/dL — AB (ref 8.9–10.3)
CO2: 19 mmol/L — ABNORMAL LOW (ref 22–32)
Chloride: 87 mmol/L — ABNORMAL LOW (ref 101–111)
Creatinine, Ser: 5.02 mg/dL — ABNORMAL HIGH (ref 0.44–1.00)
GFR calc non Af Amer: 7 mL/min — ABNORMAL LOW (ref >60)
GFR, EST AFRICAN AMERICAN: 9 mL/min — AB (ref >60)
GLUCOSE: 282 mg/dL — AB (ref 65–99)
PHOSPHORUS: 6.4 mg/dL — AB (ref 2.5–4.6)
Potassium: 5.9 mmol/L — ABNORMAL HIGH (ref 3.5–5.1)
SODIUM: 125 mmol/L — AB (ref 135–145)

## 2015-11-12 LAB — GLUCOSE, CAPILLARY
GLUCOSE-CAPILLARY: 154 mg/dL — AB (ref 65–99)
Glucose-Capillary: 255 mg/dL — ABNORMAL HIGH (ref 65–99)
Glucose-Capillary: 108 mg/dL — ABNORMAL HIGH (ref 65–99)

## 2015-11-12 LAB — CBC
HCT: 29.1 % — ABNORMAL LOW (ref 36.0–46.0)
HEMATOCRIT: 26.8 % — AB (ref 36.0–46.0)
HEMOGLOBIN: 8.9 g/dL — AB (ref 12.0–15.0)
Hemoglobin: 8.4 g/dL — ABNORMAL LOW (ref 12.0–15.0)
MCH: 27.7 pg (ref 26.0–34.0)
MCH: 28.4 pg (ref 26.0–34.0)
MCHC: 30.6 g/dL (ref 30.0–36.0)
MCHC: 31.3 g/dL (ref 30.0–36.0)
MCV: 90.7 fL (ref 78.0–100.0)
MCV: 90.5 fL (ref 78.0–100.0)
PLATELETS: 360 10*3/uL (ref 150–400)
Platelets: 371 10*3/uL (ref 150–400)
RBC: 3.21 MIL/uL — AB (ref 3.87–5.11)
RBC: 2.96 MIL/uL — ABNORMAL LOW (ref 3.87–5.11)
RDW: 14.5 % (ref 11.5–15.5)
RDW: 14.7 % (ref 11.5–15.5)
WBC: 20.5 10*3/uL — AB (ref 4.0–10.5)
WBC: 18 10*3/uL — ABNORMAL HIGH (ref 4.0–10.5)

## 2015-11-12 MED ORDER — INSULIN ASPART 100 UNIT/ML ~~LOC~~ SOLN: 0.0000 [IU] | Freq: Every evening | SUBCUTANEOUS | Status: DC | PRN

## 2015-11-12 MED ORDER — LIDOCAINE-PRILOCAINE 2.5-2.5 % EX CREA: 1.0000 "application " | TOPICAL_CREAM | CUTANEOUS | Status: DC | PRN

## 2015-11-12 MED ORDER — ALTEPLASE 2 MG IJ SOLR: 2.0000 mg | Freq: Once | INTRAMUSCULAR | Status: DC | PRN

## 2015-11-12 MED ORDER — VANCOMYCIN HCL IN DEXTROSE 1-5 GM/200ML-% IV SOLN
INTRAVENOUS | Status: AC
  Filled 2015-11-12: qty 200

## 2015-11-12 MED ORDER — PENTAFLUOROPROP-TETRAFLUOROETH EX AERO: 1.0000 "application " | INHALATION_SPRAY | CUTANEOUS | Status: DC | PRN

## 2015-11-12 MED ORDER — OXYCODONE-ACETAMINOPHEN 5-325 MG PO TABS
ORAL_TABLET | ORAL | Status: AC
  Filled 2015-11-12: qty 1

## 2015-11-12 MED ORDER — INSULIN ASPART 100 UNIT/ML ~~LOC~~ SOLN
0.0000 [IU] | Freq: Three times a day (TID) | SUBCUTANEOUS | Status: DC
  Administered 2015-11-12: 15 [IU] via SUBCUTANEOUS
  Administered 2015-11-13: 14 [IU] via SUBCUTANEOUS
  Administered 2015-11-14 – 2015-11-15 (×3): 17 [IU] via SUBCUTANEOUS
  Administered 2015-11-15: 14 [IU] via SUBCUTANEOUS

## 2015-11-12 MED ORDER — INSULIN DETEMIR 100 UNIT/ML ~~LOC~~ SOLN
9.0000 [IU] | Freq: Every day | SUBCUTANEOUS | Status: DC
  Administered 2015-11-12 – 2015-11-15 (×3): 9 [IU] via SUBCUTANEOUS
  Filled 2015-11-12 (×6): qty 0.09

## 2015-11-12 MED ORDER — HEPARIN SODIUM (PORCINE) 1000 UNIT/ML DIALYSIS: 1000.0000 [IU] | INTRAMUSCULAR | Status: DC | PRN

## 2015-11-12 MED ORDER — SODIUM CHLORIDE 0.9 % IV SOLN: 100.0000 mL | INTRAVENOUS | Status: DC | PRN

## 2015-11-12 MED ORDER — HEPARIN SODIUM (PORCINE) 1000 UNIT/ML DIALYSIS: 20.0000 [IU]/kg | INTRAMUSCULAR | Status: DC | PRN

## 2015-11-12 MED ORDER — LIDOCAINE HCL (PF) 1 % IJ SOLN: 5.0000 mL | INTRAMUSCULAR | Status: DC | PRN

## 2015-11-12 NOTE — Progress Notes (Signed)
   VASCULAR SURGERY ASSESSMENT & PLAN:  * 2 Days Post-Op s/p: debridement of left groin wound. VAC was placed also.  *  ID: appreciate ID's help. The patient is on Vancomycin and Zosyn.  * as per my note yesterday, the son and family are considering palliative care and do not want her to have any more surgery. They have discussed this with her and she seems to be considering this option. If the plan is for palliative care only, then I would favor leaving the [facility] in place as this would be the most comfortable option.  * if the plan was for palliative care only, then would also need to discuss stopping dialysis.  * Her only surgical option would be a high above-the-knee amputation and removal of the graft. My 2 primary concerns would be nonhealing of the AKA given the extent of the mottling. A second concern is obviously the risk of wound healing problems given her morbid obesity and diabetes. Given all these reasons I think the family's decision is perfectly reasonable.  SUBJECTIVE: Arousable. Pain seems to be adequately controlled.   PHYSICAL EXAM: Filed Vitals:   11/11/15 0528 11/11/15 1418 11/11/15 1948 11/12/15 0600  BP: 137/49 125/57 105/47 94/50  Pulse: 89 72    Temp: 97.7 F (36.5 C) 97.6 F (36.4 C) 96.9 F (36.1 C) 98.6 F (37 C)  TempSrc: Oral Oral Axillary Oral  Resp: 16  18 16   Weight:      SpO2: 98% 100% 99% 100%   Entire right LE is mottled.   LABS: Lab Results  Component Value Date   WBC 20.5* 11/12/2015   HGB 8.9* 11/12/2015   HCT 29.1* 11/12/2015   MCV 90.7 11/12/2015   PLT 371 11/12/2015   Lab Results  Component Value Date   CREATININE 4.52* 11/12/2015   Lab Results  Component Value Date   INR 1.08 10/21/2015   CBG (last 3)   Recent Labs  11/11/15 2204 11/11/15 2227 11/12/15 0558  GLUCAP 52* 110* 154*    Active Problems:   Abscess of right groin    Gae Gallop Beeper: [id number] 11/12/2015

## 2015-11-12 NOTE — Progress Notes (Signed)
Assessment/Plan: 1. Sepsis/Debridement of R. Groin:  2. ESRD - TTS @ [place]. K+ 4.5 2.0 K 2.25 Ca bath. EDW 873.30 3. Hypertension/volume - stable  Subjective: Interval History: Some better  Objective: Vital signs in last 24 hours: Temp:  [96.9 F (36.1 C)-98.6 F (37 C)] 98.6 F (37 C) (03/16 0600) Pulse Rate:  [72] 72 (03/15 1418) Resp:  [16-18] 16 (03/16 0600) BP: (94-125)/(47-57) 94/50 mmHg (03/16 0600) SpO2:  [99 %-100 %] 100 % (03/16 0600) Weight change:   Intake/Output from previous day: 03/15 0701 - 03/16 0700 In: 460 [P.O.:460] Out: -  Intake/Output this shift:    General appearance: alert and cooperative Chest wall: no tenderness Extremities: edema 1-2+ with mottling and post op chg/VAC  Lab Results:  Recent Labs  11/11/15 0320 11/12/15 0356  WBC 21.8* 20.5*  HGB 8.7* 8.9*  HCT 28.4* 29.1*  PLT 379 371   BMET:  Recent Labs  11/11/15 0320 11/12/15 0356  NA 132* 128*  K 4.1 5.3*  CL 94* 90*  CO2 26 23  GLUCOSE 250* 160*  BUN 26* 44*  CREATININE 3.40* 4.52*  CALCIUM 8.6* 8.7*   No results for input(s): PTH in the last 72 hours. Iron Studies: No results for input(s): IRON, TIBC, TRANSFERRIN, FERRITIN in the last 72 hours. Studies/Results: No results found.  Scheduled: . antiseptic oral rinse  7 mL Mouth Rinse BID  . aspirin  325 mg Oral Daily  . calcitRIOL  1 mcg Oral Q T,Th,Sa-HD  . calcium acetate  1,334 mg Oral TID WC  . cinacalcet  30 mg Oral Q supper  . docusate sodium  100 mg Oral BID  . feeding supplement (PRO-STAT SUGAR FREE 64)  30 mL Oral BID  . fenofibrate  54 mg Oral Daily  . fluconazole  100 mg Oral Once per day on Sun Mon Wed Fri  . gabapentin  400 mg Oral TID  . heparin  5,000 Units Subcutaneous 3 times per day  . insulin aspart  0-15 Units Subcutaneous TID WC  . insulin detemir  9 Units Subcutaneous QHS  . lanthanum  1,000 mg Oral TID WC  . levothyroxine  88 mcg Oral QAC breakfast  . multivitamin  1 tablet Oral  QHS  . pantoprazole  40 mg Oral Daily  . piperacillin-tazobactam (ZOSYN)  IV  2.25 g Intravenous Q8H  . vancomycin  1,000 mg Intravenous Q T,Th,Sa-HD     LOS: 2 days   Cheryn Lundquist C 11/12/2015,12:54 PM

## 2015-11-12 NOTE — Progress Notes (Signed)
Pt son at bedside, very anxious to speak with spiritual care regarding pt living will so that vascular can proceed with plan of care.  Spiritual consult in, called to let them know pt will likely go to dialysis at noon.  They are going to try to get someone up to speak with family/pt prior to dialysis.

## 2015-11-12 NOTE — Procedures (Signed)
Patient was seen on dialysis and the procedure was supervised.  BFR 400  Via PC BP is  111/51.   Patient appears to be tolerating treatment well  [name] A 11/12/2015

## 2015-11-12 NOTE — Progress Notes (Addendum)
Inpatient Diabetes Program Recommendations  AACE/ADA: New Consensus Statement on Inpatient Glycemic Control (2015)  Target Ranges:  Prepandial:   less than 140 mg/dL      Peak postprandial:   less than 180 mg/dL (1-2 hours)      Critically ill patients:  140 - 180 mg/dL  Results for CARMELL, DUPIN (MRN [id number]) as of 11/12/2015 08:31  Ref. Range 11/11/2015 06:19 11/11/2015 11:18 11/11/2015 16:41 11/11/2015 20:42 11/11/2015 21:02 11/11/2015 22:04 11/11/2015 22:27 11/12/2015 05:58  Glucose-Capillary Latest Ref Range: 65-99 mg/dL 216 (H) 111 (H) 112 (H) 54 (L) 53 (L) 52 (L) 110 (H) 154 (H)   Review of Glycemic Control  Current orders for Inpatient glycemic control: Novolog 0-22 units TID with meals  Inpatient Diabetes Program Recommendations: Insulin - Basal: May want to consider ordering low dose basal insulin; recommend Levemir 9 units Q24H (based on 93 kg x 0.1 units). Correction (SSI): Glucose ranged from 53-216 mg/dl on 11/11/15 and fasting glucose is 154 mg/dl today. Patient received a total of Novolog 42 units for correction on 11/11/15 which was likley cause of hypoglycemia yesterday. Please discontinue current Novolog correction scale and use regular Glycemic Control order set to order Novolog 0-15 units TID with meals and Novolog 0-5 units QHS.  Thanks, Barnie Alderman, RN, MSN, CDE Diabetes Coordinator Inpatient Diabetes Program [phone] (Team Pager from [place] to [place]) [phone] ([facility] office) [phone] [facility] office) [phone] [facility] office)

## 2015-11-12 NOTE — Progress Notes (Signed)
Hypoglycemic Event  CBG:54  Treatment: 8oz apple juice, peanut butter crackers X2 occurences, then 1/2 amp of D50  Symptoms: none Follow-up CBG: Time:2227 CBG Result:110  Possible Reasons for Event: unknown  Comments/MD notified:none    [place], Devon Kingdon [place]

## 2015-11-12 NOTE — Progress Notes (Signed)
Chaplain responded to the patient's room to provide spiritual care support, and to complete an Advance Directive.  Upon entering the patient's room [name] was met by the son who  states the urgency of the need to have the Directive completed prior to pending medical procedures.  The son's anxiety over having the Directive completed by the patient involves other family dynamics going on, which will directly relate her decision to complete at this time.  The Chaplain spoke with the patient and attempted to explain the importance of the Directive, and it being a voice for her family if she were unable to speak for herself they would know her desires.  It does not appear to the Chaplain at this time that the patient is alert enough at  to complete the Directive. Chaplain will follow up at a later time. Chaplain Yaakov Guthrie [phone]

## 2015-11-12 NOTE — NC FL2 (Signed)
[place] MEDICAID FL2 LEVEL OF CARE SCREENING TOOL     IDENTIFICATION  Patient Name: [name] Birthdate: 09/16/1934 Sex: female Admission Date (Current Location): 11/23/2015  [facility] and [place] Number:  Herbalist and Address:  [facility], [place] [address], [place], [place] [address]      Provider Number: [id number]  Attending Physician Name and Address:  Rosetta Posner, MD  Relative Name and Phone Number:  Landry Mellow [phone]    Current Level of Care: Hospital Recommended Level of Care: [place] Prior Approval Number:    Date Approved/Denied:   PASRR Number: [id number]  Discharge Plan: SNF    Current Diagnoses: Patient Active Problem List   Diagnosis Date Noted  . Abscess of right groin 11/18/2015  . PAD (peripheral artery disease) ([place]) 10/27/2015  . Renal failure (ARF), acute on chronic (HCC) 03/01/2014  . DM type 2 (diabetes mellitus, type 2) ([place]) 03/01/2014  . HTN (hypertension) 03/01/2014  . CAD in native artery 03/01/2014  . Unspecified hypothyroidism 03/01/2014  . Other complications due to renal dialysis device, implant, and graft 07/17/2013  . AVF (arteriovenous fistula) ([place]) 02/26/2013  . Renal insufficiency 02/26/2013  . End stage renal disease ([place]) 01/08/2013  . Pre-operative cardiovascular examination 01/08/2013    Orientation RESPIRATION BLADDER Height & Weight     Self, Time, Situation, Place  Normal Continent Weight:  (BEd scale not w orking) Height:     BEHAVIORAL SYMPTOMS/MOOD NEUROLOGICAL BOWEL NUTRITION STATUS      Continent  (Please see DC summary)  AMBULATORY STATUS COMMUNICATION OF NEEDS Skin   Extensive Assist Verbally Surgical wounds (Closed incision on groin, thigh, and leg)                       Personal Care Assistance Level of Assistance  Bathing, Feeding, Dressing Bathing Assistance: Maximum assistance Feeding assistance: Independent Dressing  Assistance: Maximum assistance     Functional Limitations Info             SPECIAL CARE FACTORS FREQUENCY  PT (By licensed PT)     PT Frequency: not yet assessed              Contractures      Additional Factors Info  Code Status, Allergies, Insulin Sliding Scale Code Status Info: Full Allergies Info: Macrobid, Ace Inhibitors, Angiotensin Receptor Blockers, Influenza Vaccines   Insulin Sliding Scale Info: insulin aspart (novoLOG) injection 0-15 Units;insulin detemir (LEVEMIR) injection 9 Units       Current Medications (11/12/2015):  This is the current hospital active medication list Current Facility-Administered Medications  Medication Dose Route Frequency Provider Last Rate Last Dose  . 0.9 %  sodium chloride infusion  500 mL Intravenous Once PRN Alvia Grove, PA-C      . 0.9 %  sodium chloride infusion  100 mL Intravenous PRN Estanislado Emms, MD      . 0.9 %  sodium chloride infusion  100 mL Intravenous PRN Estanislado Emms, MD      . acetaminophen (TYLENOL) tablet 1,000 mg  1,000 mg Oral Q8H PRN Alvia Grove, PA-C   1,000 mg at 11/12/15 1221  . albuterol (PROVENTIL) (2.5 MG/3ML) 0.083% nebulizer solution 2.5 mg  2.5 mg Nebulization Q6H PRN Alvia Grove, PA-C      . alteplase (CATHFLO ACTIVASE) injection 2 mg  2 mg Intracatheter Once PRN Estanislado Emms, MD      .  alum & mag hydroxide-simeth (MAALOX/MYLANTA) 200-200-20 MG/5ML suspension 15-30 mL  15-30 mL Oral Q2H PRN Alvia Grove, PA-C      . antiseptic oral rinse (CPC / CETYLPYRIDINIUM CHLORIDE 0.05%) solution 7 mL  7 mL Mouth Rinse BID Rosetta Posner, MD   7 mL at 11/11/15 2229  . aspirin tablet 325 mg  325 mg Oral Daily Alvia Grove, PA-C   325 mg at 11/12/15 1221  . calcitRIOL (ROCALTROL) capsule 1 mcg  1 mcg Oral Q T,Th,Sa-HD Valentina Gu, NP   1 mcg at 11/12/15 1222  . calcium acetate (PHOSLO) capsule 1,334 mg  1,334 mg Oral TID WC Valentina Gu, NP   1,334 mg at 11/12/15 1221  .  cinacalcet (SENSIPAR) tablet 30 mg  30 mg Oral Q supper Alvia Grove, PA-C   30 mg at 11/11/15 1743  . docusate sodium (COLACE) capsule 100 mg  100 mg Oral BID Alvia Grove, PA-C   100 mg at 11/12/15 1221  . feeding supplement (PRO-STAT SUGAR FREE 64) liquid 30 mL  30 mL Oral BID Valentina Gu, NP   30 mL at 11/12/15 1222  . fenofibrate tablet 54 mg  54 mg Oral Daily Alvia Grove, PA-C   54 mg at 11/12/15 1222  . fluconazole (DIFLUCAN) tablet 100 mg  100 mg Oral Once per day on Sun Mon Wed Fri Alvia Grove, PA-C   100 mg at 11/11/15 1313  . gabapentin (NEURONTIN) capsule 400 mg  400 mg Oral TID Alvia Grove, PA-C   400 mg at 11/12/15 1222  . guaiFENesin-dextromethorphan (ROBITUSSIN DM) 100-10 MG/5ML syrup 15 mL  15 mL Oral Q4H PRN Alvia Grove, PA-C      . heparin injection 1,000 Units  1,000 Units Dialysis PRN Estanislado Emms, MD      . heparin injection 1,900 Units  20 Units/kg Dialysis PRN Estanislado Emms, MD      . heparin injection 5,000 Units  5,000 Units Subcutaneous 3 times per day Alvia Grove, PA-C   5,000 Units at 11/12/15 [id number]  . hydrALAZINE (APRESOLINE) injection 5 mg  5 mg Intravenous Q20 Min PRN Alvia Grove, PA-C      . insulin aspart (novoLOG) injection 0-15 Units  0-15 Units Subcutaneous TID WC Ulyses Amor, PA-C   15 Units at 11/12/15 1235  . insulin aspart (novoLOG) injection 0-5 Units  0-5 Units Subcutaneous QHS PRN Ulyses Amor, PA-C      . insulin detemir (LEVEMIR) injection 9 Units  9 Units Subcutaneous QHS Ulyses Amor, PA-C      . labetalol (NORMODYNE,TRANDATE) injection 10 mg  10 mg Intravenous Q10 min PRN Alvia Grove, PA-C      . lanthanum (FOSRENOL) chewable tablet 1,000 mg  1,000 mg Oral TID WC Valentina Gu, NP   1,000 mg at 11/12/15 1221  . levothyroxine (SYNTHROID, LEVOTHROID) tablet 88 mcg  88 mcg Oral QAC breakfast Alvia Grove, PA-C   88 mcg at 11/12/15 [id number]  . lidocaine (PF) (XYLOCAINE) 1 % injection 5 mL   5 mL Intradermal PRN Estanislado Emms, MD      . lidocaine-prilocaine (EMLA) cream 1 application  1 application Topical PRN Estanislado Emms, MD      . magnesium sulfate IVPB 2 g 50 mL  2 g Intravenous Daily PRN Alvia Grove, PA-C      . metoprolol (LOPRESSOR) injection 2-5 mg  2-5 mg Intravenous Q2H PRN Alvia Grove, PA-C      . morphine 2 MG/ML injection 2-5 mg  2-5 mg Intravenous Q1H PRN Alvia Grove, PA-C   3 mg at 11/11/15 1307  . multivitamin (RENA-VIT) tablet 1 tablet  1 tablet Oral QHS Valentina Gu, NP   1 tablet at 11/11/15 2228  . nitroGLYCERIN (NITROSTAT) SL tablet 0.4 mg  0.4 mg Sublingual Q5 min PRN Alvia Grove, PA-C      . ondansetron (ZOFRAN) injection 4 mg  4 mg Intravenous Q6H PRN Alvia Grove, PA-C      . oxyCODONE-acetaminophen (PERCOCET/ROXICET) 5-325 MG per tablet 1-2 tablet  1-2 tablet Oral Q4H PRN Alvia Grove, PA-C   2 tablet at 11/12/15 0754  . pantoprazole (PROTONIX) EC tablet 40 mg  40 mg Oral Daily Alvia Grove, PA-C   40 mg at 11/12/15 1221  . pentafluoroprop-tetrafluoroeth (GEBAUERS) aerosol 1 application  1 application Topical PRN Estanislado Emms, MD      . phenol (CHLORASEPTIC) mouth spray 1 spray  1 spray Mouth/Throat PRN Alvia Grove, PA-C      . piperacillin-tazobactam (ZOSYN) IVPB 2.25 g  2.25 g Intravenous Q8H Rosetta Posner, MD   2.25 g at 11/12/15 1220  . vancomycin (VANCOCIN) 1 GM/200ML IVPB           . vancomycin (VANCOCIN) IVPB 1000 mg/200 mL premix  1,000 mg Intravenous Q T,Th,Sa-HD Rosetta Posner, MD   1,000 mg at 11/12/15 1719     Discharge Medications: Please see discharge summary for a list of discharge medications.  Relevant Imaging Results:  Relevant Lab Results:   Additional Information [phone].  Patient on dialysis TTS at [facility]. Chair time 10:50 am.  Benard Halsted, LCSWA

## 2015-11-12 NOTE — Consult Note (Addendum)
WOC follow-up:  Discussed plan of care with Vascular team PA.  No Vac dressing change is desired. Pt's family discussing possible comfort care goals. Please refer to VVS team for further plan of care. Please re-consult if further assistance is needed.  Thank-you,  Julien Girt MSN, [place], [place], [place], [place]

## 2015-11-12 NOTE — Addendum Note (Signed)
Addendum  created 11/12/15 0948 by Duane Boston, MD   Modules edited: Anesthesia Responsible Staff

## 2015-11-13 DIAGNOSIS — L0889 Other specified local infections of the skin and subcutaneous tissue: Secondary | ICD-10-CM

## 2015-11-13 LAB — CBC
HCT: 27.4 % — ABNORMAL LOW (ref 36.0–46.0)
Hemoglobin: 8.6 g/dL — ABNORMAL LOW (ref 12.0–15.0)
MCH: 28.4 pg (ref 26.0–34.0)
MCHC: 31.4 g/dL (ref 30.0–36.0)
MCV: 90.4 fL (ref 78.0–100.0)
PLATELETS: ADEQUATE 10*3/uL (ref 150–400)
RBC: 3.03 MIL/uL — AB (ref 3.87–5.11)
RDW: 14.8 % (ref 11.5–15.5)
WBC: 17.1 10*3/uL — AB (ref 4.0–10.5)

## 2015-11-13 LAB — GLUCOSE, CAPILLARY
GLUCOSE-CAPILLARY: 114 mg/dL — AB (ref 65–99)
Glucose-Capillary: 124 mg/dL — ABNORMAL HIGH (ref 65–99)
Glucose-Capillary: 62 mg/dL — ABNORMAL LOW (ref 65–99)
Glucose-Capillary: 95 mg/dL (ref 65–99)
Glucose-Capillary: 106 mg/dL — ABNORMAL HIGH (ref 65–99)
Glucose-Capillary: 183 mg/dL — ABNORMAL HIGH (ref 65–99)

## 2015-11-13 MED ORDER — DEXTROSE 10 % IV SOLN
INTRAVENOUS | Status: DC
  Administered 2015-11-13: 50 mL/h via INTRAVENOUS
  Administered 2015-11-15: 12:00:00 via INTRAVENOUS
  Filled 2015-11-13: qty 1000

## 2015-11-13 MED ORDER — DEXTROSE 50 % IV SOLN
INTRAVENOUS | Status: AC
  Administered 2015-11-13: 25 mL
  Filled 2015-11-13: qty 50

## 2015-11-13 NOTE — Progress Notes (Signed)
    [facility] for Infectious Disease   Reason for visit: Follow up on necrotizing soft tissue infection  Interval History: continued family discussions regarding goals of care.    Physical Exam: Constitutional:  Filed Vitals:   11/13/15 1245 11/13/15 1400  BP: 131/26 105/46  Pulse: 74   Temp: 97.9 F (36.6 C) 99.9 F (37.7 C)  Resp: 16      Review of Systems: Unable to be assessed due to mental status  Lab Results  Component Value Date   WBC 17.1* 11/13/2015   HGB 8.6* 11/13/2015   HCT 27.4* 11/13/2015   MCV 90.4 11/13/2015   PLT  11/13/2015    PLATELET CLUMPS NOTED ON SMEAR, COUNT APPEARS ADEQUATE    Lab Results  Component Value Date   CREATININE 5.02* 11/12/2015   BUN 52* 11/12/2015   NA 125* 11/12/2015   K 5.9* 11/12/2015   CL 87* 11/12/2015   CO2 19* 11/12/2015    Lab Results  Component Value Date   ALT 12* 11/15/2015   AST 17 11/05/2015   ALKPHOS 98 11/22/2015     Microbiology: Recent Results (from the past 240 hour(s))  Blood culture (routine x 2)     Status: None (Preliminary result)   Collection Time: 10/31/2015  3:20 AM  Result Value Ref Range Status   Specimen Description BLOOD RIGHT ARM  Final   Special Requests AEROBIC BOTTLE ONLY 10ML  Final   Culture NO GROWTH 3 DAYS  Final   Report Status PENDING  Incomplete  Blood culture (routine x 2)     Status: None (Preliminary result)   Collection Time: 11/09/2015  3:25 AM  Result Value Ref Range Status   Specimen Description BLOOD RIGHT HAND  Final   Special Requests IN PEDIATRIC BOTTLE 2ML  Final   Culture NO GROWTH 3 DAYS  Final   Report Status PENDING  Incomplete  Anaerobic culture     Status: None (Preliminary result)   Collection Time: 11/07/2015  7:29 AM  Result Value Ref Range Status   Specimen Description ABSCESS RIGHT GROIN  Final   Special Requests NONE  Final   Gram Stain   Final    MODERATE WBC PRESENT, PREDOMINANTLY PMN NO SQUAMOUS EPITHELIAL CELLS SEEN MODERATE GRAM POSITIVE  COCCI IN PAIRS RARE GRAM NEGATIVE RODS Performed at [facility]    Culture   Final    NO ANAEROBES ISOLATED; CULTURE IN PROGRESS FOR 5 DAYS Performed at [facility]    Report Status PENDING  Incomplete  Culture, routine-abscess     Status: None (Preliminary result)   Collection Time: 11/25/2015  7:29 AM  Result Value Ref Range Status   Specimen Description ABSCESS RIGHT GROIN  Final   Special Requests NONE  Final   Gram Stain   Final    MODERATE WBC PRESENT, PREDOMINANTLY PMN NO SQUAMOUS EPITHELIAL CELLS SEEN MODERATE GRAM POSITIVE COCCI IN PAIRS RARE GRAM NEGATIVE RODS Performed at [facility]    Culture   Final    Culture reincubated for better growth Performed at [facility]    Report Status PENDING  Incomplete    Impression/Plan:  1. Soft tissue infection - continue broad spectrum antibioitics pending continued family discussions.   Dr. Baxter Flattery available over the weekend if needed, otherwise I will follow up on Monday.

## 2015-11-13 NOTE — Progress Notes (Signed)
Patient is very sleepy this morning but arouses easily to voice. She refused all oral medications except one pain pill.  Will continue to monitor patient.

## 2015-11-13 NOTE — Progress Notes (Signed)
ANTIBIOTIC CONSULT NOTE - INITIAL  Pharmacy Consult for Vanco/Zosyn Indication: wound infection  Allergies  Allergen Reactions  . Macrobid [Nitrofurantoin Macrocrystal] Anaphylaxis and Rash  . Ace Inhibitors Other (See Comments)    hyperkalemia  . Angiotensin Receptor Blockers Other (See Comments)    hyperkalemia  . Influenza Vaccines Itching, Rash and Other (See Comments)    blisters    Patient Measurements: Weight:  (bed weight not working) Adjusted Body Weight:    Vital Signs: Temp: 99.6 F (37.6 C) (03/17 0548) Temp Source: Axillary (03/17 0548) BP: 125/44 mmHg (03/17 0548) Pulse Rate: 58 (03/17 0548) Intake/Output from previous day: 03/16 0701 - 03/17 0700 In: -  Out: 2803  Intake/Output from this shift:    Labs:  Recent Labs  11/11/15 0320 11/12/15 0356 11/12/15 1330 11/13/15 0330  WBC 21.8* 20.5* 18.0* 17.1*  HGB 8.7* 8.9* 8.4* 8.6*  PLT 379 371 360 PLATELET CLUMPS NOTED ON SMEAR, COUNT APPEARS ADEQUATE  CREATININE 3.40* 4.52* 5.02*  --    Estimated Creatinine Clearance: 9.4 mL/min (by C-G formula based on Cr of 5.02). No results for input(s): VANCOTROUGH, VANCOPEAK, VANCORANDOM, GENTTROUGH, GENTPEAK, GENTRANDOM, TOBRATROUGH, TOBRAPEAK, TOBRARND, AMIKACINPEAK, AMIKACINTROU, AMIKACIN in the last 72 hours.   Microbiology:   Medical History: Past Medical History  Diagnosis Date  . Hypertension   . Heart failure [facility]) December 07, 2012    Right side  . Hypercalcemia     2nd to hyperparathyroidism  . Thyroid disease     Hyperparathyroidism  . Thyroid disease     Hypothyroidism  . Dyslipidemia   . CHF (congestive heart failure) ([place])   . Myocardial infarction ([place]) 10/28/2010  . Peripheral vascular disease ([place])   . Short-term memory loss   . Stroke ([place]) 2010ish    no residual  . Shingles 2010 ish  . Anginal pain ([place])   . Coronary artery disease   . Diabetes mellitus without complication ([place])     TYPE 2  . Parathyroid cyst ([place])   .  Headache(784.0)     CALCIFICATION LT SKULL  . Arthritis     Gout  . Cancer ([place])     kidney left removed  . Sleep apnea     Recently test in [place](NEG) NO CPAP   . ESRD (end stage renal disease) ([place])     dialysis T-TH-SAT  . Hypothyroidism   . Shortness of breath     with exertion     Assessment: 81 yo female with wound infection will be continued on vancomycin and zosyn.  Patient has ESRD on HD TThSat.  - right femoral endarterectomy, vein patch and right femoral to below-knee popliteal bypass 2/28 >> right groin pain  Infectious Disease: Vanc/Zosyn D#4 for gas gangrene infxn of right groin, s/p I&D 3/14, more mottled 3/15 and may need AKA, VAC change 3/16 and may need further I&D, but family may pursue pallcare - afebrile, WBC improved to 17.1. Abx dosed for ESRD. - AKA or hip disarticulation at the hip will not likely heal, consulting palliative and discussing with family.  Vanc 3/14 >> Zosyn 3/14 >> Fluc PTA >>  3/14 right groin abscess - GPC / GNR on Gram stain 3/14 right groin abscess (anaerobic) - GPC / GNR on Gram stain 3/14 BCx x2 >> pending  Goal of Therapy:  Vancomycin level <15 prior to re-dosing post-HD.  Plan:  - Vanc 1gm IV q-HD TTS - Zosyn 2.25gm IV Q8H - Diflucan 100mg  PO MWFSun per MD - f/u on decision  for HD tomorrow.   Xee Hollman S. Alford Highland, PharmD, BCPS Clinical Staff Pharmacist Pager [phone]  Eilene Ghazi Stillinger 11/13/2015,11:53 AM

## 2015-11-13 NOTE — Care Management Important Message (Signed)
Important Message  Patient Details  Name: [name] MRN: [id number] Date of Birth: Feb 04, 1935   Medicare Important Message Given:  Yes    [name] 11/13/2015, 2:29 PM

## 2015-11-13 NOTE — Progress Notes (Signed)
   VASCULAR SURGERY ASSESSMENT & PLAN:  * Difficult situation. I had a long discussion yesterday with the son and we both agree that palliative care is really the best option. However apparently there are some family members were not yet agreeable with this. Given that she is mottled up to her groin, I do not think she will heal an above-the-knee amputation. Given her age, morbid obesity, and multiple medical comorbidities, I do not think that she is a candidate for a hip disarticulation and she may not heal that regardless. In addition, she has an extensive wound in the right groin with significant risk for bleeding from the vein patch on her artery if I remove her graft. For all these reasons I would favor comfort measures only and discontinuing hemodialysis. I will discuss this further with any family members that comment today. For the time being, I am leaving her VAC in place.  * sHE CONTINUES VANCOMYCIN AND zOSYN.  SUBJECTIVE: Pain appears adequately controlled. I have again discussed the situation in detail with the patient and expressed my concerns that she will not heal any further surgery that we might do.  PHYSICAL EXAM: Filed Vitals:   11/12/15 1757 11/12/15 1836 11/12/15 2037 11/13/15 0548  BP: 155/50 155/58 121/44 125/44  Pulse: 72 77 66 58  Temp:  98.7 F (37.1 C) 98.2 F (36.8 C) 99.6 F (37.6 C)  TempSrc:  Oral Oral Axillary  Resp:  17 12 15   Weight:      SpO2:  97% 96% 99%   Right lower extremity mottled up to the groin. VAC in place.  LABS: Lab Results  Component Value Date   WBC 17.1* 11/13/2015   HGB 8.6* 11/13/2015   HCT 27.4* 11/13/2015   MCV 90.4 11/13/2015   PLT  11/13/2015    PLATELET CLUMPS NOTED ON SMEAR, COUNT APPEARS ADEQUATE   Lab Results  Component Value Date   CREATININE 5.02* 11/12/2015   Lab Results  Component Value Date   INR 1.08 10/21/2015   CBG (last 3)   Recent Labs  11/12/15 1107 11/12/15 2146 11/13/15 0642  GLUCAP 255* 108*  124*    Active Problems:   Abscess of right groin    Gae Gallop Beeper: [id number] 11/13/2015

## 2015-11-13 NOTE — Progress Notes (Signed)
Assessment/Plan: 1. Sepsis/Debridement of R. Groin after prior FPBPG:  2. ESRD - TTS @ [facility]. K+ 4.5 2.0 K 2.25 Ca bath. EDW 873.30.  I will hold on ordering dialysis for Saturday pending family discussion.  If no decision, will proceed tomorrow. 3. Hypertension/volume - stable 4. Lethargy/altered mental status...stop high dose gabapentin  Subjective: Interval History:  Lethargic this AM.  Dr Nicole Cella not reviewed  Objective: Vital signs in last 24 hours: Temp:  [98.2 F (36.8 Benton)-99.6 F (37.6 Benton)] 99.6 F (37.6 Benton) (03/17 0548) Pulse Rate:  [58-77] 58 (03/17 0548) Resp:  [12-18] 15 (03/17 0548) BP: (93-155)/(35-60) 125/44 mmHg (03/17 0548) SpO2:  [96 %-100 %] 99 % (03/17 0548) Weight change:   Intake/Output from previous day: 03/16 0701 - 03/17 0700 In: -  Out: 2803  Intake/Output this shift:    General appearance: slowed mentation and lethargic Chest wall: no tenderness Cardio: regular rate and rhythm, S1, S2 normal, no murmur, click, rub or gallop GI: soft, non-tender; bowel sounds normal; no masses,  no organomegaly Extremities: less edema in both legs, mottling in RLE maybe less, wound VAC in right groin  Lab Results:  Recent Labs  11/12/15 1330 11/13/15 0330  WBC 18.0* 17.1*  HGB 8.4* 8.6*  HCT 26.8* 27.4*  PLT 360 PLATELET CLUMPS NOTED ON SMEAR, COUNT APPEARS ADEQUATE   BMET:  Recent Labs  11/12/15 0356 11/12/15 1330  NA 128* 125*  K 5.3* 5.9*  CL 90* 87*  CO2 23 19*  GLUCOSE 160* 282*  BUN 44* 52*  CREATININE 4.52* 5.02*  CALCIUM 8.7* 8.4*   No results for input(s): PTH in the last 72 hours. Iron Studies: No results for input(s): IRON, TIBC, TRANSFERRIN, FERRITIN in the last 72 hours. Studies/Results: No results found.  Scheduled: . antiseptic oral rinse  7 mL Mouth Rinse BID  . aspirin  325 mg Oral Daily  . calcitRIOL  1 mcg Oral Q T,Th,Sa-HD  . calcium acetate  1,334 mg Oral TID WC  . cinacalcet  30 mg Oral Q supper  . docusate  sodium  100 mg Oral BID  . feeding supplement (PRO-STAT SUGAR FREE 64)  30 mL Oral BID  . fenofibrate  54 mg Oral Daily  . fluconazole  100 mg Oral Once per day on Sun Mon Wed Fri  . gabapentin  400 mg Oral TID  . heparin  5,000 Units Subcutaneous 3 times per day  . insulin aspart  0-15 Units Subcutaneous TID WC  . insulin detemir  9 Units Subcutaneous QHS  . lanthanum  1,000 mg Oral TID WC  . levothyroxine  88 mcg Oral QAC breakfast  . multivitamin  1 tablet Oral QHS  . pantoprazole  40 mg Oral Daily  . piperacillin-tazobactam (ZOSYN)  IV  2.25 g Intravenous Q8H  . vancomycin  1,000 mg Intravenous Q T,Th,Sa-HD     LOS: 3 days   [name] 11/13/2015,11:42 AM

## 2015-11-14 LAB — CBC
HCT: 26.8 % — ABNORMAL LOW (ref 36.0–46.0)
HEMOGLOBIN: 8.2 g/dL — AB (ref 12.0–15.0)
MCH: 27.7 pg (ref 26.0–34.0)
MCHC: 30.6 g/dL (ref 30.0–36.0)
MCV: 90.5 fL (ref 78.0–100.0)
Platelets: 359 10*3/uL (ref 150–400)
RBC: 2.96 MIL/uL — AB (ref 3.87–5.11)
RDW: 14.9 % (ref 11.5–15.5)
WBC: 21 10*3/uL — AB (ref 4.0–10.5)

## 2015-11-14 LAB — GLUCOSE, CAPILLARY
GLUCOSE-CAPILLARY: 188 mg/dL — AB (ref 65–99)
GLUCOSE-CAPILLARY: 237 mg/dL — AB (ref 65–99)
Glucose-Capillary: 217 mg/dL — ABNORMAL HIGH (ref 65–99)
Glucose-Capillary: 208 mg/dL — ABNORMAL HIGH (ref 65–99)
Glucose-Capillary: 81 mg/dL (ref 65–99)
Glucose-Capillary: 84 mg/dL (ref 65–99)

## 2015-11-14 NOTE — Progress Notes (Signed)
   VASCULAR SURGERY ASSESSMENT & PLAN:  * I had a long discussion with the patient's other son Joe yesterday. We discussed DO NOT RESUSCITATE status. He wishes for her to be DO NOT RESUSCITATE. The family is trying to decide on palliative care, however at this point our primary concern is comfort, and she seems to be comfortable. I do not think that she would survive further surgery given the extensive wound in her groin and the fact that she would not heal even a high above-the-knee amputation.  * Renal plans on continuing hemodialysis unless the family decides otherwise. I would favor discontinuing hemodialysis and discontinuing antibiotics if the family is agreeable for comfort measures only.   SUBJECTIVE: Her mental status fluctuates some but she seems alert and appropriate at times.  PHYSICAL EXAM: Filed Vitals:   11/13/15 1245 11/13/15 1400 11/13/15 2000 11/14/15 0610  BP: 131/26 105/46 147/24 128/37  Pulse: 74  65 63  Temp: 97.9 F (36.6 C) 99.9 F (37.7 C) 99.4 F (37.4 C)   TempSrc: Oral Oral Oral   Resp: 16  17 14   Weight:      SpO2: 97%  98% 99%   The entire right lower extremity up to the hip is mottled.  We have left the VAC in place.  LABS: Lab Results  Component Value Date   WBC 21.0* 11/14/2015   HGB 8.2* 11/14/2015   HCT 26.8* 11/14/2015   MCV 90.5 11/14/2015   PLT 359 11/14/2015   Lab Results  Component Value Date   CREATININE 5.02* 11/12/2015   Lab Results  Component Value Date   INR 1.08 10/21/2015   CBG (last 3)   Recent Labs  11/13/15 2134 11/14/15 0211 11/14/15 0557  GLUCAP 183* 217* 188*    Active Problems:   Abscess of right groin    Gae Gallop Beeper: [id number] 11/14/2015

## 2015-11-14 NOTE — Progress Notes (Signed)
VASCULAR SURGERY:  Discussed situation with daughter and both sons. They are all not agreeable for comfort measures only. No more HD. Will d/c antibiotics. Referral for Hospice if available on weekend.  [name], MD, [place] [phone]

## 2015-11-14 NOTE — Progress Notes (Signed)
Assessment/Plan: 1. Sepsis/Debridement of R. Groin after prior FPBPG:  2. ESRD - TTS @ [facility]. K+ 4.5 2.0 K 2.25 Ca bath. EDW 873.30. Will need to have discussion with family about level of aggressiveness, but given potential encephalopathy related to medication will do HD today 3. Hypertension/volume - stable 4. Lethargy/altered mental status. ? Med(gabapentin v other)  Subjective: Interval History: remains lethargic but awakens  Objective: Vital signs in last 24 hours: Temp:  [97.9 F (36.6 C)-99.9 F (37.7 C)] 99.4 F (37.4 C) (03/17 2000) Pulse Rate:  [63-74] 63 (03/18 0610) Resp:  [14-17] 14 (03/18 0610) BP: (105-147)/(24-46) 128/37 mmHg (03/18 0610) SpO2:  [97 %-99 %] 99 % (03/18 0610) Weight change:   Intake/Output from previous day: 03/17 0701 - 03/18 0700 In: 120 [P.O.:120] Out: -  Intake/Output this shift:    General appearance: slowed mentation and lethargic Extremities: edema RLE blistering and bullus formation  Lab Results:  Recent Labs  11/13/15 0330 11/14/15 0522  WBC 17.1* 21.0*  HGB 8.6* 8.2*  HCT 27.4* 26.8*  PLT PLATELET CLUMPS NOTED ON SMEAR, COUNT APPEARS ADEQUATE 359   BMET:  Recent Labs  11/12/15 0356 11/12/15 1330  NA 128* 125*  K 5.3* 5.9*  CL 90* 87*  CO2 23 19*  GLUCOSE 160* 282*  BUN 44* 52*  CREATININE 4.52* 5.02*  CALCIUM 8.7* 8.4*   No results for input(s): PTH in the last 72 hours. Iron Studies: No results for input(s): IRON, TIBC, TRANSFERRIN, FERRITIN in the last 72 hours. Studies/Results: No results found.  Scheduled: . antiseptic oral rinse  7 mL Mouth Rinse BID  . aspirin  325 mg Oral Daily  . calcitRIOL  1 mcg Oral Q T,Th,Sa-HD  . calcium acetate  1,334 mg Oral TID WC  . cinacalcet  30 mg Oral Q supper  . docusate sodium  100 mg Oral BID  . feeding supplement (PRO-STAT SUGAR FREE 64)  30 mL Oral BID  . fenofibrate  54 mg Oral Daily  . fluconazole  100 mg Oral Once per day on Sun Mon Wed Fri  . heparin   5,000 Units Subcutaneous 3 times per day  . insulin aspart  0-15 Units Subcutaneous TID WC  . insulin detemir  9 Units Subcutaneous QHS  . lanthanum  1,000 mg Oral TID WC  . levothyroxine  88 mcg Oral QAC breakfast  . multivitamin  1 tablet Oral QHS  . pantoprazole  40 mg Oral Daily  . piperacillin-tazobactam (ZOSYN)  IV  2.25 g Intravenous Q8H  . vancomycin  1,000 mg Intravenous Q T,Th,Sa-HD     LOS: 4 days   [name] C 11/14/2015,10:13 AM

## 2015-11-15 DIAGNOSIS — Z515 Encounter for palliative care: Secondary | ICD-10-CM | POA: Insufficient documentation

## 2015-11-15 DIAGNOSIS — L8990 Pressure ulcer of unspecified site, unspecified stage: Secondary | ICD-10-CM | POA: Insufficient documentation

## 2015-11-15 LAB — CBC
HEMATOCRIT: 26 % — AB (ref 36.0–46.0)
Hemoglobin: 8 g/dL — ABNORMAL LOW (ref 12.0–15.0)
MCH: 27.7 pg (ref 26.0–34.0)
MCHC: 30.8 g/dL (ref 30.0–36.0)
MCV: 90 fL (ref 78.0–100.0)
PLATELETS: 350 10*3/uL (ref 150–400)
RBC: 2.89 MIL/uL — AB (ref 3.87–5.11)
RDW: 15.1 % (ref 11.5–15.5)
WBC: 23.5 10*3/uL — AB (ref 4.0–10.5)

## 2015-11-15 LAB — GLUCOSE, CAPILLARY
GLUCOSE-CAPILLARY: 140 mg/dL — AB (ref 65–99)
GLUCOSE-CAPILLARY: 113 mg/dL — AB (ref 65–99)
GLUCOSE-CAPILLARY: 212 mg/dL — AB (ref 65–99)
Glucose-Capillary: 162 mg/dL — ABNORMAL HIGH (ref 65–99)

## 2015-11-15 LAB — CULTURE, BLOOD (ROUTINE X 2)
CULTURE: NO GROWTH
Culture: NO GROWTH

## 2015-11-15 LAB — CULTURE, ROUTINE-ABSCESS

## 2015-11-15 MED ORDER — DEXTROSE 50 % IV SOLN
INTRAVENOUS | Status: AC
  Filled 2015-11-15: qty 50

## 2015-11-15 NOTE — Progress Notes (Signed)
Chart reviewed. Goals appear to be in place for full comfort care. Verified with Dr. Scot Dock that consult was strictly for hospice. Spoke to son [name] who stated the family desired in-patient hospice for which she should qualify as she is stopping HD. Order placed for Social work. Will sign off Romona Curls, ANP Palliative Medicine

## 2015-11-15 NOTE — Progress Notes (Signed)
It appears decision made for Hospice referral and dialysis was cancelled yesterday with no further dialysis planned. We will sign off.  Please re-consult if needed. Triton Heidrich C

## 2015-11-15 NOTE — Progress Notes (Signed)
   VASCULAR SURGERY ASSESSMENT & PLAN:  * Comfort measures only and DNR.  * No further HD.  * Currently no family available.    * No further labs.  SUBJECTIVE: Resting comfortably.  PHYSICAL EXAM: Filed Vitals:   11/14/15 1138 11/14/15 1821 11/14/15 2010 11/15/15 0344  BP: 137/41 129/36 125/46 133/49  Pulse: 67 59 53 62  Temp: 100.4 F (38 C) 99.4 F (37.4 C) 100.3 F (37.9 C) 98.8 F (37.1 C)  TempSrc: Oral Oral Axillary Oral  Resp: 14  12 16   Weight:      SpO2: 92% 97% 100% 97%   No change in Right lower extremity.   CBG (last 3)   Recent Labs  11/14/15 1558 11/14/15 2126 11/15/15 0611  GLUCAP 81 84 140*    Active Problems:   Abscess of right groin   Pressure ulcer  Gae Gallop Beeper: [id number] 11/15/2015

## 2015-11-16 LAB — ANAEROBIC CULTURE

## 2015-11-16 LAB — GLUCOSE, CAPILLARY
Glucose-Capillary: 135 mg/dL — ABNORMAL HIGH (ref 65–99)
Glucose-Capillary: 193 mg/dL — ABNORMAL HIGH (ref 65–99)
Glucose-Capillary: 232 mg/dL — ABNORMAL HIGH (ref 65–99)
Glucose-Capillary: 241 mg/dL — ABNORMAL HIGH (ref 65–99)
Glucose-Capillary: 292 mg/dL — ABNORMAL HIGH (ref 65–99)

## 2015-11-16 NOTE — Progress Notes (Signed)
Pt is resting comfortably, not responsive to voice, unable to tolerate PO medications.

## 2015-11-16 NOTE — Progress Notes (Signed)
Per previous notes, pt is to be comfort care only.  Sgt. [name] [facility] with vascular regarding some interventions currently ordered (CBG, sq heparin, tele), she was fine with discontinuing but asked that I get cleared by palliative first.  Called and left a voicemail with palliative care team, awaiting call back.

## 2015-11-16 NOTE — Progress Notes (Signed)
Received call back from Dr. Hilma Favors with palliative care. She said she will discontinue all the non-comfort measure orders as soon as she has access to a computer, but received VO to discontinue tele, CBG monitoring, sq heparin for now.

## 2015-11-16 NOTE — Progress Notes (Signed)
Nutrition Brief Note  Patient identified on the </= 12 braden score report. Plan of care is comfort measures. No nutrition interventions warranted at this time.  Please consult as needed.   Arthur Holms, RD, LDN Pager #: [phone] After-Hours Pager #: [phone]

## 2015-11-16 NOTE — Progress Notes (Signed)
   VASCULAR SURGERY ASSESSMENT & PLAN:  * Hospice consulted.   * Comfort measures only.   * No HD   SUBJECTIVE: Arousable. Appears comfortable.  PHYSICAL EXAM: Filed Vitals:   11/15/15 1542 11/15/15 1900 11/16/15 0000 11/16/15 0406  BP: 137/42 139/49  128/52  Pulse: 85 61  81  Temp:  99 F (37.2 C)  98.6 F (37 C)  TempSrc:  Oral  Oral  Resp: 18 18 16 18   Weight:      SpO2: 100% 100%  100%   Entire Right LE ischemic.  VAC in place.    Active Problems:   Abscess of right groin   Pressure ulcer   Palliative care encounter  Gae Gallop Beeper: [id number] 11/16/2015

## 2015-11-16 NOTE — Care Management Note (Signed)
Case Management Note Marvetta Gibbons RN, BSN Unit 2W-Case Manager [phone]  Patient Details  Name: [name] MRN: [id number] Date of Birth: 03/29/1935  Subjective/Objective:  Pt admitted with abscess of right groin s/p I&D with wound VAC placement                   Action/Plan: PTA pt was at [place] following- referral received for Hospice- plan now for full comfort care- stopping HD- and family would like [facility]- CSW to f/u with family regarding hospice choice for Res. Hospice facility.   Expected Discharge Date:                  Expected Discharge Plan:  [place]  In-House Referral:  Clinical Social Work  Discharge planning Services  CM Consult  Post Acute Care Choice:    Choice offered to:     DME Arranged:    DME Agency:     HH Arranged:    [place] Agency:     Status of Service:  Completed, signed off  Medicare Important Message Given:  Yes Date Medicare IM Given:    Medicare IM give by:    Date Additional Medicare IM Given:    Additional Medicare Important Message give by:     If discussed at [place] of Stay Meetings, dates discussed:    Discharge Disposition:   Additional Comments:  Dawayne Patricia, RN 11/16/2015, 10:34 AM

## 2015-11-16 NOTE — Progress Notes (Signed)
[facility] has evaluated patient for residential placement, however, patient's son [name] now states that he is not allowed to make health care decisions for the patient and that his brother [name] is HPOA and he must decide. [name] is out of town and will be back tomorrow per [name] (would not give Hospice [facility]'s phone number). CSW to continue to follow.  Percell Locus Abraham Entwistle LCSWA [phone]

## 2015-11-16 NOTE — Progress Notes (Signed)
Pt was seen by Lupita Shutter, RN for [facility]  Per Tiffany, the pt does not qualify to be admitted to hospice facility directly from [facility] due to the fact that she hasn't required any symptom management for several hours (ie: no meds for pain/agitation/anxiety).  She advised to keep close eye on pt for tonight and medicate PRN and she will be back to re-evaluate tomorrow.  Tiffany also spoke to pt's son, Mitzi Hansen, who it seems just found out that he is not the healthcare POA, although he has been in charge of pt's finances up until this point.  Pt's other son, Roderic Palau is the POA, he lives out of town and is expected to be here tomorrow to discuss plan of care with Tiffany.

## 2015-11-16 NOTE — Care Management Important Message (Signed)
Important Message  Patient Details  Name: [name] MRN: [id number] Date of Birth: 08/23/1935   Medicare Important Message Given:  Yes    Barb Merino [place] 11/16/2015, 3:29 PM

## 2015-11-16 NOTE — Progress Notes (Signed)
CSW received consult regarding Residential Hospice Placement for patient. CSW will follow up with patient's family and make a referral if appropriate.  Percell Locus Joellen Tullos LCSWA [phone]

## 2015-11-17 DIAGNOSIS — Z515 Encounter for palliative care: Secondary | ICD-10-CM

## 2015-11-17 MED ORDER — ACETAMINOPHEN 325 MG PO TABS: 650.0000 mg | ORAL_TABLET | Freq: Four times a day (QID) | ORAL | Status: DC | PRN

## 2015-11-17 MED ORDER — HALOPERIDOL LACTATE 2 MG/ML PO CONC
0.5000 mg | ORAL | Status: DC | PRN
  Filled 2015-11-17: qty 0.3

## 2015-11-17 MED ORDER — ACETAMINOPHEN 650 MG RE SUPP: 650.0000 mg | Freq: Four times a day (QID) | RECTAL | Status: DC | PRN

## 2015-11-17 MED ORDER — HALOPERIDOL LACTATE 5 MG/ML IJ SOLN: 0.5000 mg | INTRAMUSCULAR | Status: DC | PRN

## 2015-11-17 MED ORDER — SCOPOLAMINE 1 MG/3DAYS TD PT72
1.0000 | MEDICATED_PATCH | TRANSDERMAL | Status: DC
  Administered 2015-11-17: 1.5 mg via TRANSDERMAL
  Filled 2015-11-17 (×2): qty 1

## 2015-11-17 MED ORDER — BIOTENE DRY MOUTH MT LIQD: 15.0000 mL | OROMUCOSAL | Status: DC | PRN

## 2015-11-17 MED ORDER — GLYCOPYRROLATE 0.2 MG/ML IJ SOLN: 0.4000 mg | INTRAMUSCULAR | Status: AC | PRN

## 2015-11-17 MED ORDER — POLYVINYL ALCOHOL 1.4 % OP SOLN: 1.0000 [drp] | Freq: Four times a day (QID) | OPHTHALMIC | Status: DC | PRN

## 2015-11-17 MED ORDER — MORPHINE SULFATE (PF) 2 MG/ML IV SOLN: 2.0000 mg | INTRAVENOUS | Status: AC | PRN

## 2015-11-17 MED ORDER — GLYCOPYRROLATE 0.2 MG/ML IJ SOLN
0.4000 mg | INTRAMUSCULAR | Status: DC | PRN
  Administered 2015-11-17: 0.4 mg via INTRAVENOUS
  Filled 2015-11-17 (×3): qty 2

## 2015-11-17 MED ORDER — HALOPERIDOL 0.5 MG PO TABS
0.5000 mg | ORAL_TABLET | ORAL | Status: DC | PRN
  Filled 2015-11-17: qty 1

## 2015-11-18 ENCOUNTER — Encounter: Payer: Medicare Other | Admitting: Vascular Surgery

## 2015-11-28 NOTE — Progress Notes (Signed)
CSW spoke with patient's son, Mitzi Hansen, over the phone regarding his mother's plan of care. Patient's son stated that he does not have the power to make decisions and that he provided the nurse's station with his brother, Joseph's, phone number. He stated that he had texted [facility]'s information and Broadus John was going to try to see if he could get off work to come to the hospital to make the decisions. Mitzi Hansen was unable to provide CSW with Joseph's number. Mitzi Hansen reported that his mother's comfort is his only concern and he will arrive to the hospital after lunch. CSW to continue to follow.  Percell Locus Lerry Cordrey LCSWA [phone]

## 2015-11-28 NOTE — Progress Notes (Signed)
Patient will DC to: [facility] Anticipated DC date: 11/19/2015 Family notified: Son Transport by: PTAR  CSW signing off.  Cedric Fishman, [place] Social Worker [phone]

## 2015-11-28 NOTE — Care Management Note (Signed)
Case Management Note Marvetta Gibbons RN, BSN Unit 2W-Case Manager [phone]  Patient Details  Name: HADE DIMITRIOU MRN: [id number] Date of Birth: October 23, 1934  Subjective/Objective:  Pt admitted with abscess of right groin s/p I&D with wound VAC placement                   Action/Plan: PTA pt was at [place] following- referral received for Hospice- plan now for full comfort care- stopping HD- and family would like [facility]- CSW to f/u with family regarding hospice choice for Res. Hospice facility.   Expected Discharge Date:    November 30, 2015              Expected Discharge Plan:  [place]  In-House Referral:  Clinical Social Work  Discharge planning Services  CM Consult  Post Acute Care Choice:    Choice offered to:     DME Arranged:    DME Agency:     HH Arranged:    [place] Agency:     Status of Service:  Completed, signed off  Medicare Important Message Given:  Yes Date Medicare IM Given:    Medicare IM give by:    Date Additional Medicare IM Given:    Additional Medicare Important Message give by:     If discussed at [place] of Stay Meetings, dates discussed:    Discharge Disposition: Hospice Home-Residential    Additional Comments:  30-Nov-2015- pt was for d/c to [facility]- pt expired prior to tx to [place].   Dawayne Patricia, RN 11/18/2015, 11:28 AM

## 2015-11-28 NOTE — Progress Notes (Signed)
Nurse went to check on pt prior to pt transferring to Hospice. Upon entering the room RN noticed pt was not breathing, no palpable pulses. 2nd nurse verified. Pt pronounced dead ar 1740.  Rufina Falco, RN

## 2015-11-28 NOTE — Progress Notes (Signed)
Pt is resting comfortably, rattled breathing is notably increased from yesterday.  Oral care performed.  She reacted to oral care but is otherwise non-responsive.  Orders have not been updated by palliative care yet, but per notes, pt is comfort care only.  Palliative has been paged again to receive updated orders in system.

## 2015-11-28 NOTE — Consult Note (Signed)
WOC consulted for palliative transition wound care orders.  VAC in place currently. DC VAC dressing use moist to moist saline gauze dressings daily for now.  Will reeval in a day or so to make sure the wound is not draining too much for this and patient is comfortable with dressing changes. If needed at time of reeval may be able to use more absorbant dressings and change less frequently.  WOC will reeval in am after VAC removed.  Chieko Neises [place] RN,CWOCN [id number]

## 2015-11-28 NOTE — Discharge Summary (Signed)
Discharge Summary    [name] 09-22-1934 81 y.o. female  [id number]  Admission Date: 11/16/2015  Discharge Date: 2015/11/19  Physician: Angelia Mould, MD  Admission Diagnosis: leg pain Right groin infection   HPI:   This is a 81 y.o. female who is a very complex morbidly obese 81 year old end-stage renal disease patient. She presented to the emergency room early this morning with increasing pain in her right groin. She had undergone a right femoral endarterectomy and vein patch and right femoral to below-knee popliteal bypass with Gore-Tex graft with Dr. Scot Dock on 10/27/2015. She had a critical limb ischemia with dry gangrenous changes in her right toes. She had an uneventful initial postoperative course and was discharged to a skilled nursing facility. The patient is in severe pain. She reports this is been present for approximately 1 week. She reports that physician had seen her at the nursing facility regarding this and was Keeping the area dry. She is morbidly obese with a very large pannus overhanging this area. She reports no increase in pain in her right foot. She has no pain in her left foot.   She currently undergoes dialysis via a IJ catheter.  Hospital Course:  The patient was admitted to the hospital and taken to the operating room on 11/13/2015 and underwent: Extensive debridement of right groin with the skin and subcutaneous fat down to the fascia.    In the PACU, pt was evaluated and did not have doppler flow to right foot.  She was s/p right CFA vein patch angioplasty and right femoral to below knee popliteal bypass with PTFE on 10/27/15 and s/p extensive debridement right groin for gas gangrene on 11/06/2015.  A RLE graft duplex was ordered to evaluate patency.   No graft was encountered during surgery.  Absscess was contained to groin/pannus area.  Unable to visual flow in graft with duplex. Patient likely occluded graft sometime in last couple weeks. Will  not pursue any surgical intervention. Her most important concern is her infection, which may likely infect graft, resulting in graft removal and AKA.   An infectious disease consult was obtained for gas gangrene infection of right groin.  Ms. Giudice developed a necrotizing soft tissue wound infection of her right groin following recent right femoral to popliteal bypass graft. Fortunately there was no obvious exposed graft at the time of surgery this morning. Operative Gram stain and cultures are pending. I agree with empiric vancomycin and piperacillin tazobactam for now.  This patient had presented with critical limb ischemia of the right lower extremity with gangrene of the right foot. Her arteriogram showed no significant iliac artery occlusive disease on the right although she had an extensive bulky plaque in the right common femoral artery and occlusion of the superficial femoral artery and popliteal artery. She underwent extensive right common femoral artery endarterectomy with vein patch angioplasty and a right femoral to below knee popliteal artery bypass with a 6 mm PTFE graft. I did harvest her right great saphenous vein but it was not adequate in size to use as a bypass conduit. She has end-stage renal disease, diabetes, and morbid obesity and developed an extensive wound infection at the skilled nursing facility. Yesterday, Dr. Donnetta Hutching performed extensive debridement of the right groin and placement of a VAC. The infection was in the subcutaneous tissue and did not appear to involve the graft. She was noted to have a mottled right lower extremities today with an occluded graft.  On exam she has  no motor or sensory function in the right foot. Mottling extends up to the groin. Currently her pain appears to be adequately controlled. I had a long discussion with her son Mitzi Hansen this morning. The family has had extensive discussion and are considering Palliative Care given her age and multiple  comorbidities. At this point, they do not want any further surgery. I think that this is perfectly reasonable given the situation. Her only surgical option would be a high above-the-knee amputation and removal of the graft. My 2 primary concerns would be nonhealing of the AKA given the extent of the mottling, although the preoperative arteriogram did not show any significant inflow disease on the right. A second concern is obviously the risk of wound healing problems given her morbid obesity and diabetes. Given all these reasons I think the family's decision is perfectly reasonable. Her son Mitzi Hansen will be in to discuss this with her tonight and then hopefully we can get palliative care involved in the near future. This point our primary concern is pain control. She will continue dialysis with her tunneled dialysis catheter.  On 11/11/15, per ID, She has a polymicrobial right groin infection. We will continue vancomycin and piperacillin tazobactam pending culture results and further discussion about goals of care.  * 2 Days Post-Op s/p: debridement of left groin wound. VAC was placed also.  * ID: appreciate ID's help. The patient is on Vancomycin and Zosyn.  * as per my note yesterday, the son and family are considering palliative care and do not want her to have any more surgery. They have discussed this with her and she seems to be considering this option. If the plan is for palliative care only, then I would favor leaving the [facility] in place as this would be the most comfortable option.  * if the plan was for palliative care only, then would also need to discuss stopping dialysis.  * Her only surgical option would be a high above-the-knee amputation and removal of the graft. My 2 primary concerns would be nonhealing of the AKA given the extent of the mottling. A second concern is obviously the risk of wound healing problems given her morbid obesity and diabetes. Given all these reasons I think the  family's decision is perfectly reasonable.  On 11/13/15 * Difficult situation. I had a long discussion yesterday with the son and we both agree that palliative care is really the best option. However apparently there are some family members were not yet agreeable with this. Given that she is mottled up to her groin, I do not think she will heal an above-the-knee amputation. Given her age, morbid obesity, and multiple medical comorbidities, I do not think that she is a candidate for a hip disarticulation and she may not heal that regardless. In addition, she has an extensive wound in the right groin with significant risk for bleeding from the vein patch on her artery if I remove her graft. For all these reasons I would favor comfort measures only and discontinuing hemodialysis. I will discuss this further with any family members that comment today. For the time being, I am leaving her VAC in place.  * sHE CONTINUES VANCOMYCIN AND zOSYN.  On 11/14/15: * Dr. Scot Dock had a long discussion with the patient's other son Joe yesterday. We discussed DO NOT RESUSCITATE status. He wishes for her to be DO NOT RESUSCITATE. The family is trying to decide on palliative care, however at this point our primary concern is comfort, and  she seems to be comfortable. I do not think that she would survive further surgery given the extensive wound in her groin and the fact that she would not heal even a high above-the-knee amputation.  * Renal plans on continuing hemodialysis unless the family decides otherwise. I would favor discontinuing hemodialysis and discontinuing antibiotics if the family is agreeable for comfort measures only.   After discussion with the family, it was agreeable that comfort measures would be taken and pt become DNR and discontinue HD.    On 12-15-2015, per palliative recommendations are as follows: SUMMARY OF RECOMMENDATIONS  Code Status/Advance Care Planning: DNR  Other Directives:None  Symptom  Management:   Palliative EOL order set, she is only requiring PRN IV morphine  Secretion management has been challenging-started on Robinul.  REQUESTED [place], probably no reason to continue negative pressure wound therapy.  Palliative Prophylaxis:   Aspiration, Frequent Pain Assessment, Oral Care, Palliative Wound Care and Turn Reposition  Additional Recommendations (Limitations, Scope, Preferences):  Full Comfort Care   CBC    Component Value Date/Time   WBC 23.5* 11/15/2015 0408   RBC 2.89* 11/15/2015 0408   HGB 8.0* 11/15/2015 0408   HCT 26.0* 11/15/2015 0408   PLT 350 11/15/2015 0408   MCV 90.0 11/15/2015 0408   MCH 27.7 11/15/2015 0408   MCHC 30.8 11/15/2015 0408   RDW 15.1 11/15/2015 0408   LYMPHSABS 0.6* 11/09/2015 0345   MONOABS 1.6* 11/25/2015 0345   EOSABS 0.0 11/02/2015 0345   BASOSABS 0.0 10/31/2015 0345    BMET    Component Value Date/Time   NA 125* 11/12/2015 1330   K 5.9* 11/12/2015 1330   CL 87* 11/12/2015 1330   CO2 19* 11/12/2015 1330   GLUCOSE 282* 11/12/2015 1330   BUN 52* 11/12/2015 1330   CREATININE 5.02* 11/12/2015 1330   CALCIUM 8.4* 11/12/2015 1330   GFRNONAA 7* 11/12/2015 1330   GFRAA 9* 11/12/2015 1330      Discharge Instructions    Discharge instructions    Complete by:  As directed   Palliative Prophylaxis:      Aspiration, Frequent Pain Assessment, Oral Care, Palliative Wound Care and Turn Reposition Symptom Management:      Palliative EOL order set, she is only requiring PRN IV morphine     Secretion management has been challenging-started on Robinul. Full Comfort Care.     Discharge wound care:    Complete by:  As directed   Wet to dry dressing to wound bid.           Discharge Diagnosis:  leg pain Right groin infection  Secondary Diagnosis: Patient Active Problem List   Diagnosis Date Noted  . Pressure ulcer 11/15/2015  . Palliative care encounter   . Abscess of right groin  11/22/2015  . PAD (peripheral artery disease) ([place]) 10/27/2015  . Renal failure (ARF), acute on chronic (HCC) 03/01/2014  . DM type 2 (diabetes mellitus, type 2) ([place]) 03/01/2014  . HTN (hypertension) 03/01/2014  . CAD in native artery 03/01/2014  . Unspecified hypothyroidism 03/01/2014  . Other complications due to renal dialysis device, implant, and graft 07/17/2013  . AVF (arteriovenous fistula) ([place]) 02/26/2013  . Renal insufficiency 02/26/2013  . End stage renal disease ([place]) 01/08/2013  . Pre-operative cardiovascular examination 01/08/2013   Past Medical History  Diagnosis Date  . Hypertension   . Heart failure [facility]) December 07, 2012    Right side  . Hypercalcemia     2nd to  hyperparathyroidism  . Thyroid disease     Hyperparathyroidism  . Thyroid disease     Hypothyroidism  . Dyslipidemia   . CHF (congestive heart failure) ([place])   . Myocardial infarction ([place]) 10/28/2010  . Peripheral vascular disease ([place])   . Short-term memory loss   . Stroke ([place]) 2010ish    no residual  . Shingles 2010 ish  . Anginal pain ([place])   . Coronary artery disease   . Diabetes mellitus without complication ([place])     TYPE 2  . Parathyroid cyst ([place])   . Headache(784.0)     CALCIFICATION LT SKULL  . Arthritis     Gout  . Cancer ([place])     kidney left removed  . Sleep apnea     Recently test in [place](NEG) NO CPAP   . ESRD (end stage renal disease) ([place])     dialysis T-TH-SAT  . Hypothyroidism   . Shortness of breath     with exertion        Medication List    STOP taking these medications        acetaminophen 500 MG tablet  Commonly known as:  TYLENOL     albuterol (2.5 MG/3ML) 0.083% nebulizer solution  Commonly known as:  PROVENTIL     albuterol 108 (90 Base) MCG/ACT inhaler  Commonly known as:  PROVENTIL HFA;VENTOLIN HFA     aspirin 325 MG tablet     calcium acetate 667 MG capsule  Commonly known as:  PHOSLO     cephALEXin 250 MG capsule  Commonly known as:   KEFLEX     cephALEXin 500 MG capsule  Commonly known as:  KEFLEX     cinacalcet 30 MG tablet  Commonly known as:  SENSIPAR     docusate sodium 100 MG capsule  Commonly known as:  COLACE     fenofibrate 54 MG tablet     Fish Oil 1000 MG Caps     fluconazole 100 MG tablet  Commonly known as:  DIFLUCAN     folic acid-vitamin b complex-vitamin c-selenium-zinc 3 MG Tabs tablet     FOSRENOL 1000 MG chewable tablet  Generic drug:  lanthanum     gabapentin 400 MG capsule  Commonly known as:  NEURONTIN     insulin aspart 100 UNIT/ML injection  Commonly known as:  novoLOG     levothyroxine 88 MCG tablet  Commonly known as:  SYNTHROID, LEVOTHROID     nitroGLYCERIN 0.4 MG SL tablet  Commonly known as:  NITROSTAT     oxyCODONE-acetaminophen 5-325 MG tablet  Commonly known as:  PERCOCET/ROXICET      TAKE these medications        glycopyrrolate 0.2 MG/ML injection  Commonly known as:  ROBINUL  Inject 2 mLs (0.4 mg total) into the vein every 4 (four) hours as needed (secretions).     morphine 2 MG/ML injection  Inject 1-2.5 mLs (2-5 mg total) into the vein every hour as needed.        Prescriptions given: none  Instructions: 1.  Code Status/Advance Care Planning: DNR  Other Directives:None  Symptom Management:   Palliative EOL order set, she is only requiring PRN IV morphine  Secretion management has been challenging-started on Robinul.  Palliative Prophylaxis:   Aspiration, Frequent Pain Assessment, Oral Care, Palliative Wound Care and Turn Reposition  Additional Recommendations (Limitations, Scope, Preferences): Full Comfort Care  Psycho-social/Spiritual:  Support System: Poor Desire for further Chaplaincy support:no Additional Recommendations: Education on Hospice  Prognosis: Hours - Days  Discharge Planning: Hospice facility  Wound Care: -wet to dry dressing changes bid.  Disposition: Hospice  Patient's condition: is Poor  Follow  up: none   [name], PA-C [facility] [phone] 22-Nov-2015  2:13 PM

## 2015-11-28 NOTE — Progress Notes (Signed)
Pt care taken over from Amanda RN. Bedside report completed. Patient expresses no needs at this time. Call light within reach. Will continue to monitor.  Rowin Bayron S [place], RN   

## 2015-11-28 NOTE — Progress Notes (Signed)
Report called to Marine scientist at [facility]. All questions answered. Peripheral IV removed per their request. Report given to [place] for continued bedside care.  Fritz Pickerel, RN

## 2015-11-28 NOTE — Progress Notes (Signed)
Wound vac discontinued. Moist dressing applied.  Fritz Pickerel, RN

## 2015-11-28 NOTE — Progress Notes (Addendum)
Pt sleeping comfortably and I did not wake her.  Per Palliative-comfort care only and no HD.  Awaiting son, Chauncey Reading to arrive today from out of town.   RHYNE, SAMANTHA 12/04/15 8:02 AM  Agree with above.  Comfort measures only.  [name], MD, [place] [phone] Office: [phone]

## 2015-11-28 NOTE — Consult Note (Signed)
Consultation Note Date: 12-08-2015   Patient Name: [name]  DOB: Oct 11, 1934  MRN: [id number]  Age / Sex: 81 y.o., female  PCP: Rochel Brome, MD Referring Physician: Angelia Mould, MD  Reason for Consultation: Hospice Evaluation  Clinical Assessment/Narrative:81 yo ESRD, Sepsis following debridement of right groin abscess, multiple end stage medical problems. Now transitioning to comfort care. She is actively dying. Palliative consulted to help with symptom management and hospice disposition.  Social Work has contacted the family and plans are in progress for hospice facility placement.  She currently appears comfortable-she is unresponsive to verbal and tactile stimulation.  SUMMARY OF RECOMMENDATIONS  Code Status/Advance Care Planning: DNR  Other Directives:None  Symptom Management:   Palliative EOL order set, she is only requiring PRN IV morphine  Secretion management has been challenging-started on [place].  REQUESTED [place], probably no reason to continue negative pressure wound therapy.  Palliative Prophylaxis:   Aspiration, Frequent Pain Assessment, Oral Care, Palliative Wound Care and Turn Reposition  Additional Recommendations (Limitations, Scope, Preferences):  Full Comfort Care   Psycho-social/Spiritual:  Support System: Poor Desire for further Chaplaincy support:no Additional Recommendations: Education on Hospice  Prognosis: Hours - Days  Discharge Planning: Hospice facility   Chief Complaint/ Primary Diagnoses: Present on Admission:  . Abscess of right groin  I have reviewed the medical record, interviewed the patient and family, and examined the patient. The following aspects are pertinent.  Past Medical History  Diagnosis Date  . Hypertension   . Heart failure [facility]) December 07, 2012    Right side  . Hypercalcemia     2nd to  hyperparathyroidism  . Thyroid disease     Hyperparathyroidism  . Thyroid disease     Hypothyroidism  . Dyslipidemia   . CHF (congestive heart failure) ([place])   . Myocardial infarction ([place]) 10/28/2010  . Peripheral vascular disease ([place])   . Short-term memory loss   . Stroke ([place]) 2010ish    no residual  . Shingles 2010 ish  . Anginal pain ([place])   . Coronary artery disease   . Diabetes mellitus without complication ([place])     TYPE 2  . Parathyroid cyst ([place])   . Headache(784.0)     CALCIFICATION LT SKULL  . Arthritis     Gout  . Cancer ([place])     kidney left removed  . Sleep apnea     Recently test in [place](NEG) NO CPAP   . ESRD (end stage renal disease) ([place])     dialysis T-TH-SAT  . Hypothyroidism   . Shortness of breath     with exertion    Social History   Social History  . Marital Status: Widowed    Spouse Name: N/A  . Number of Children: N/A  . Years of Education: N/A   Social History Main Topics  . Smoking status: Former Smoker -- 46 years    Types: Cigarettes    Quit date: 01/08/1998  . Smokeless tobacco: Never Used  . Alcohol Use: No  . Drug Use: No  . Sexual Activity: Not Asked   Other Topics Concern  . None   Social History Narrative   Family History  Problem Relation Age of Onset  . Diabetes Sister   . Heart disease Sister     before age 48  . Hyperlipidemia Sister   . Hypertension Sister   . Diabetes Sister   . Hyperlipidemia Sister   . Hypertension Sister    Scheduled Meds: .  antiseptic oral rinse  7 mL Mouth Rinse BID  . scopolamine  1 patch Transdermal Q72H   Continuous Infusions:  PRN Meds:.acetaminophen **OR** acetaminophen, albuterol, antiseptic oral rinse, glycopyrrolate, haloperidol **OR** haloperidol **OR** haloperidol lactate, morphine injection, ondansetron, polyvinyl alcohol Medications Prior to Admission:  Prior to Admission medications   Medication Sig Start Date End Date Taking? Authorizing Provider  acetaminophen  (TYLENOL) 500 MG tablet Take 1,000 mg by mouth every 8 (eight) hours as needed for mild pain.    Yes Historical Provider, MD  albuterol (PROVENTIL HFA;VENTOLIN HFA) 108 (90 BASE) MCG/ACT inhaler Inhale 2 puffs into the lungs every 6 (six) hours as needed for wheezing or shortness of breath.   Yes Historical Provider, MD  albuterol (PROVENTIL) (2.5 MG/3ML) 0.083% nebulizer solution Take 2.5 mg by nebulization every 6 (six) hours as needed for wheezing or shortness of breath.    Yes Historical Provider, MD  aspirin 325 MG tablet Take 325 mg by mouth daily.   Yes Historical Provider, MD  calcium acetate (PHOSLO) 667 MG capsule Take 1,334 mg by mouth 3 (three) times daily with meals.  09/28/15  Yes Historical Provider, MD  cephALEXin (KEFLEX) 250 MG capsule Take 1 capsule (250 mg total) by mouth 3 (three) times daily. 10/12/15  Yes Angelia Mould, MD  cinacalcet (SENSIPAR) 30 MG tablet Take 30 mg by mouth daily with supper.   Yes Historical Provider, MD  docusate sodium (COLACE) 100 MG capsule Take 100 mg by mouth 2 (two) times daily.   Yes Historical Provider, MD  fenofibrate 54 MG tablet Take 54 mg by mouth daily.  05/06/14  Yes Historical Provider, MD  fluconazole (DIFLUCAN) 100 MG tablet Take 100 mg by mouth See admin instructions. On Monday, Wednesday, Friday and Sunday until 11/13/15   Yes Historical Provider, MD  folic acid-vitamin b complex-vitamin c-selenium-zinc (DIALYVITE) 3 MG TABS tablet Take 1 tablet by mouth daily.   Yes Historical Provider, MD  FOSRENOL 1000 MG chewable tablet Chew 1,000 mg by mouth 3 (three) times daily before meals. 09/17/15  Yes Historical Provider, MD  gabapentin (NEURONTIN) 400 MG capsule Take 400 mg by mouth 3 (three) times daily.   Yes Historical Provider, MD  insulin aspart (NOVOLOG) 100 UNIT/ML injection Inject 0-22 Units into the skin 3 (three) times daily with meals. CBG: 0-70= 0 units 71-100=9 units after meal 101-150=14 before meal 151-200=16 units before  meal 201-250=17 units before meal 251-300=18 units before meal 301-350=19 units before meal 351-400=20 units before meal Over 400=22 units before meal 03/08/14  Yes Hosie Poisson, MD  levothyroxine (SYNTHROID, LEVOTHROID) 88 MCG tablet Take 88 mcg by mouth daily before breakfast.   Yes Historical Provider, MD  nitroGLYCERIN (NITROSTAT) 0.4 MG SL tablet Place 0.4 mg under the tongue every 5 (five) minutes as needed for chest pain.   Yes Historical Provider, MD  Omega-3 Fatty Acids (FISH OIL) 1000 MG CAPS Take 1,000 mg by mouth 2 (two) times daily.   Yes Historical Provider, MD  oxyCODONE-acetaminophen (PERCOCET/ROXICET) 5-325 MG tablet Take 1-2 tablets by mouth every 4 (four) hours as needed for moderate pain. 11/03/15  Yes Ulyses Amor, PA-C  cephALEXin (KEFLEX) 500 MG capsule Take 1 capsule (500 mg total) by mouth daily. Patient not taking: Reported on 11/20/2015 11/03/15   Ulyses Amor, PA-C   Allergies  Allergen Reactions  . Macrobid [Nitrofurantoin Macrocrystal] Anaphylaxis and Rash  . Ace Inhibitors Other (See Comments)    hyperkalemia  . Angiotensin Receptor Blockers Other (See  Comments)    hyperkalemia  . Influenza Vaccines Itching, Rash and Other (See Comments)    blisters    Review of Systems  Physical Exam  Vital Signs: BP 139/41 mmHg  Pulse 115  Temp(Src) 99.8 F (37.7 C) (Axillary)  Resp 18  Wt 93 kg (205 lb 0.4 oz)  SpO2 95%  SpO2: SpO2: 95 % O2 Device:SpO2: 95 % O2 Flow Rate: .O2 Flow Rate (L/min): 2 L/min  IO: Intake/output summary:  Intake/Output Summary (Last 24 hours) at 12/12/15 1343 Last data filed at 11/16/15 2208  Gross per 24 hour  Intake      0 ml  Output      0 ml  Net      0 ml    LBM: Last BM Date: 11/15/15 Baseline Weight: Weight: 94.348 kg (208 lb) Most recent weight: Weight:  (bed weight not working)      Palliative Assessment/Data:    Additional Data Reviewed:  CBC:    Component Value Date/Time   WBC 23.5* 11/15/2015 0408   HGB  8.0* 11/15/2015 0408   HCT 26.0* 11/15/2015 0408   PLT 350 11/15/2015 0408   MCV 90.0 11/15/2015 0408   NEUTROABS 29.3* 11/27/2015 0345   LYMPHSABS 0.6* 11/26/2015 0345   MONOABS 1.6* 11/26/2015 0345   EOSABS 0.0 11/25/2015 0345   BASOSABS 0.0 11/23/2015 0345   Comprehensive Metabolic Panel:    Component Value Date/Time   NA 125* 11/12/2015 1330   K 5.9* 11/12/2015 1330   CL 87* 11/12/2015 1330   CO2 19* 11/12/2015 1330   BUN 52* 11/12/2015 1330   CREATININE 5.02* 11/12/2015 1330   GLUCOSE 282* 11/12/2015 1330   CALCIUM 8.4* 11/12/2015 1330   AST 17 11/01/2015 0345   ALT 12* 11/25/2015 0345   ALKPHOS 98 11/11/2015 0345   BILITOT 0.8 11/18/2015 0345   PROT 6.4* 11/20/2015 0345   ALBUMIN 1.6* 11/12/2015 1330     Time In: 1PM Time Out: 1:35PM Time Total: 35 minutes Greater than 50%  of this time was spent counseling and coordinating care related to the above assessment and plan.  Signed by: Roma Schanz, DO  December 12, 2015, 1:43 PM  Please contact Palliative Medicine Team phone at [phone] for questions and concerns.

## 2015-11-28 DEATH — deceased

## 2016-09-22 IMAGING — CR DG CHEST 1V PORT
1 series · 1 of 1 positions shown · non-contrast
Comparison: 05/23/2014

CLINICAL DATA: Fever and right leg pain

EXAM:
PORTABLE CHEST 1 VIEW

[AP]
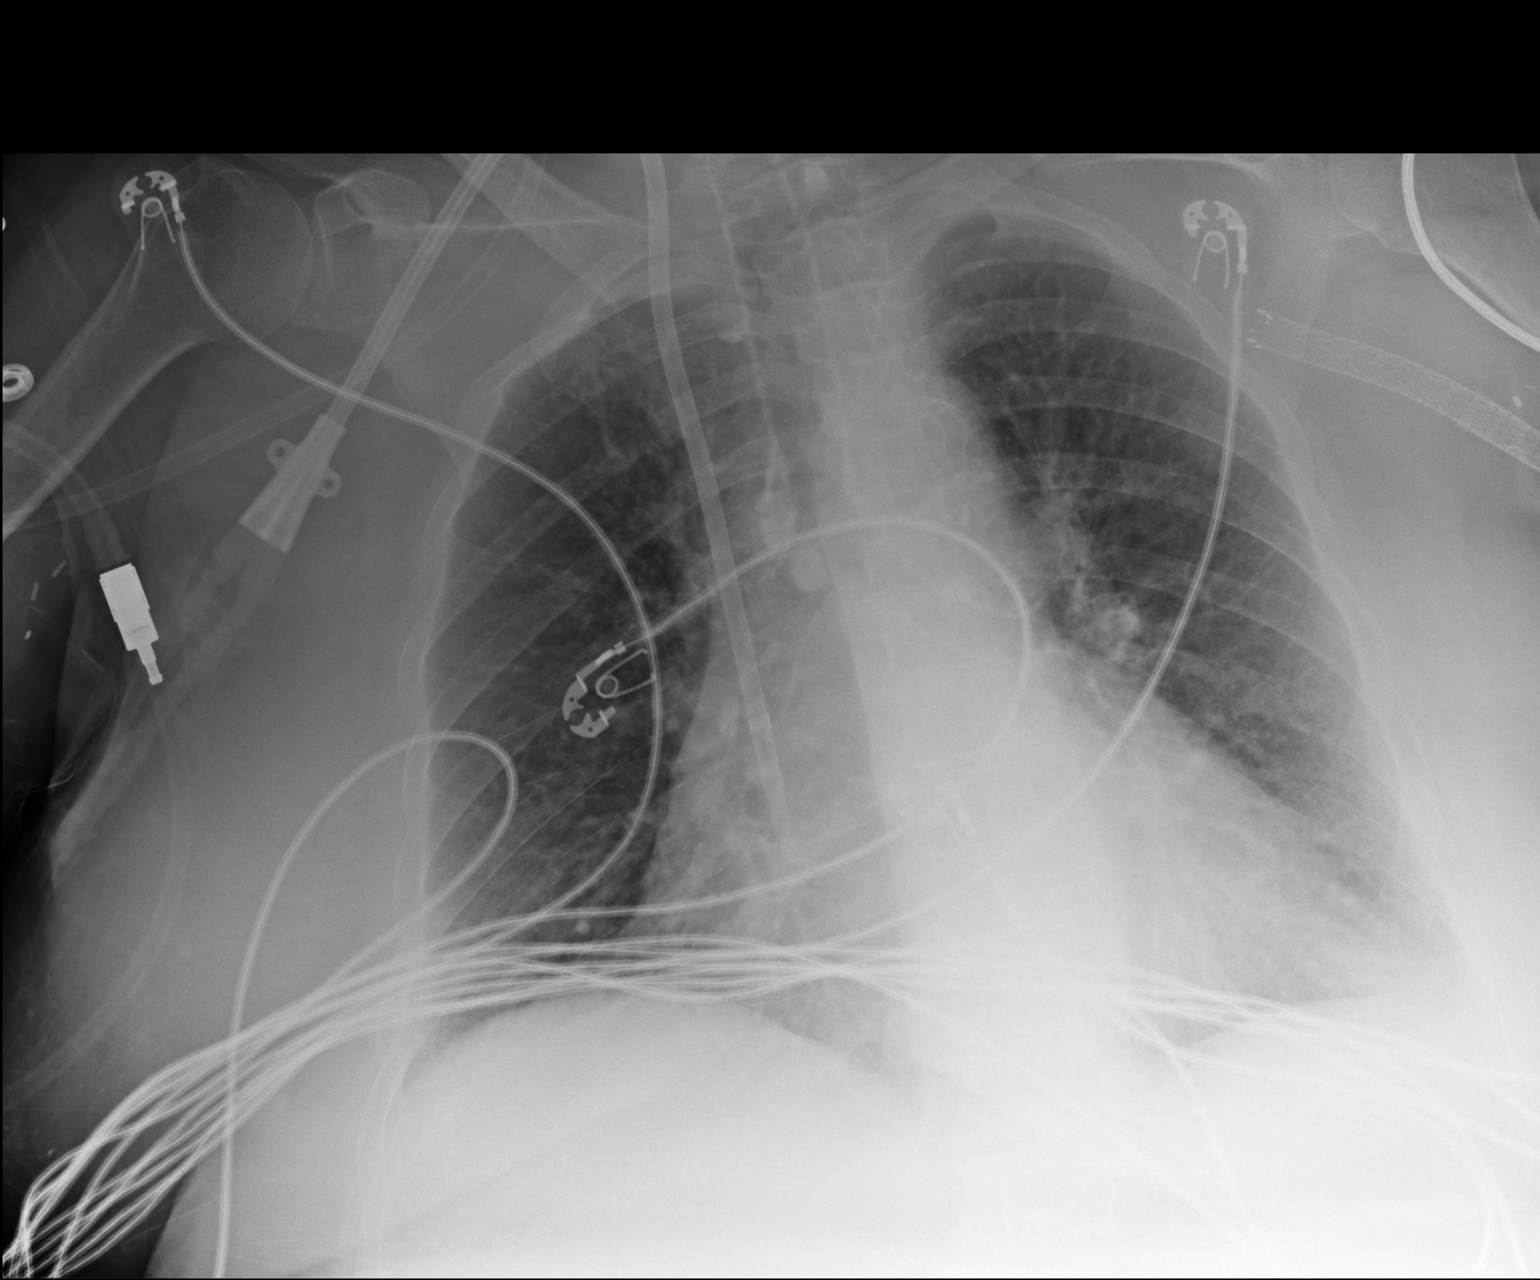

[1 of 1 positions shown; findings below may reference images not displayed]

FINDINGS: Dialysis catheter from the right with tip at the upper right atrium.
There is chronic cardiomegaly, accentuated by rotation and
mediastinal fat pad. Calcified mediastinal lymph nodes.

Artifact from EKG leads. Chronic interstitial coarsening which is
bronchitic appearing. There is no edema, consolidation, effusion, or
pneumothorax.
IMPRESSION: Chronic bronchitic markings.  No focal pneumonia.

## 2016-09-22 IMAGING — CT CT PELVIS W/ CM
2 of 3 series · 11 of 46 positions shown, 12 images · IV contrast (omnipaque)
Comparison: None available

CLINICAL DATA: Concern for postoperative wound infection.
10/27/2015 right inguinal surgery with bypass and common femoral
artery endarterectomy.

EXAM:
CT PELVIS WITH CONTRAST
TECHNIQUE: Multidetector CT imaging of the pelvis was performed using the
standard protocol following the bolus administration of intravenous
contrast.
CONTRAST:  80mL OMNIPAQUE IOHEXOL 300 MG/ML  SOLN

[Series 201: routine, idose (2) · axial · 0.98mm/px · z∈[+10,+315]mm · 8 of 71 slices shown, 9 images]
[im 5/71  soft-tissue]
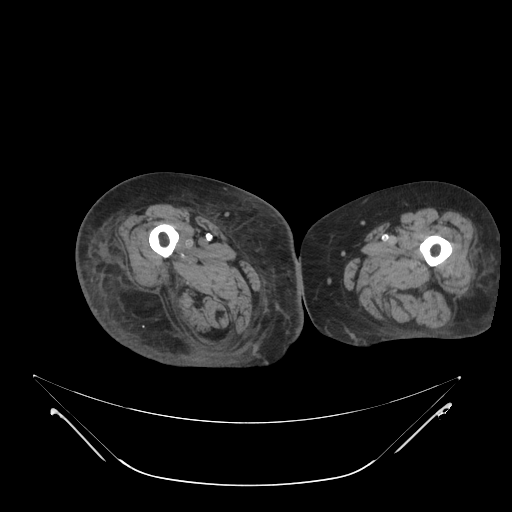
[im 5/71  bone]
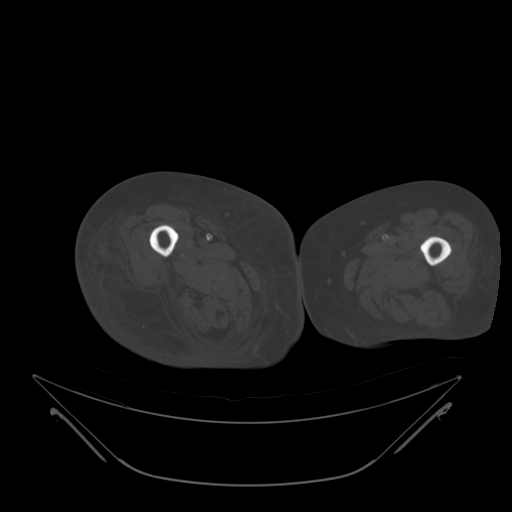
[im 14/71  soft-tissue]
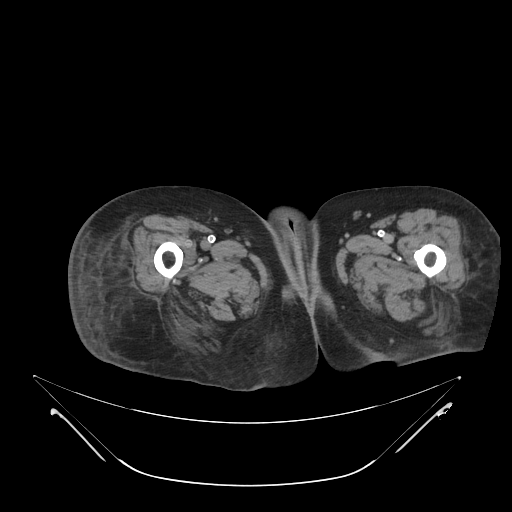
[im 23/71  soft-tissue]
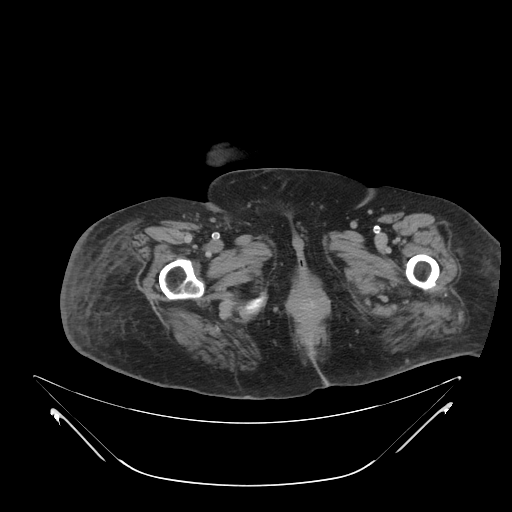
[im 32/71  soft-tissue]
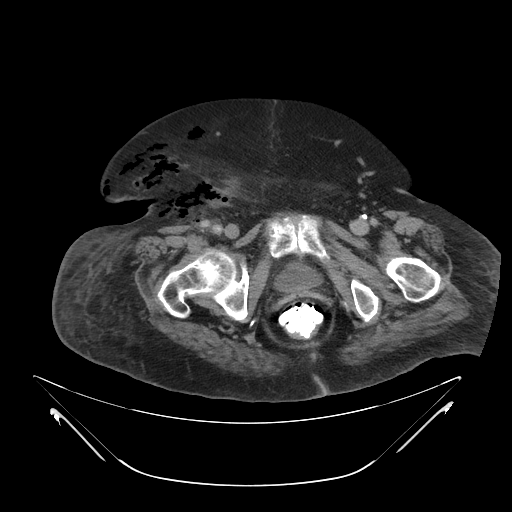
[im 39/71  soft-tissue]
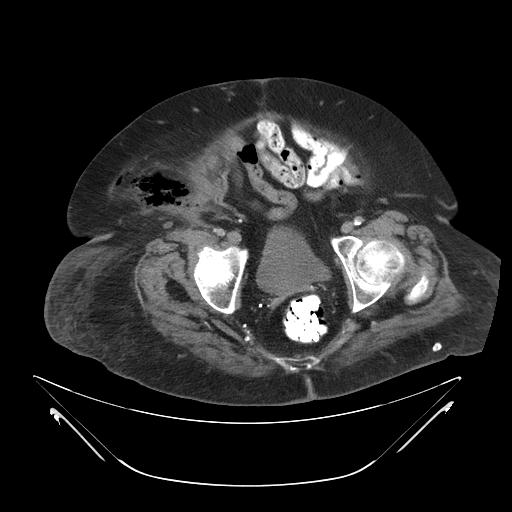
[im 48/71  soft-tissue]
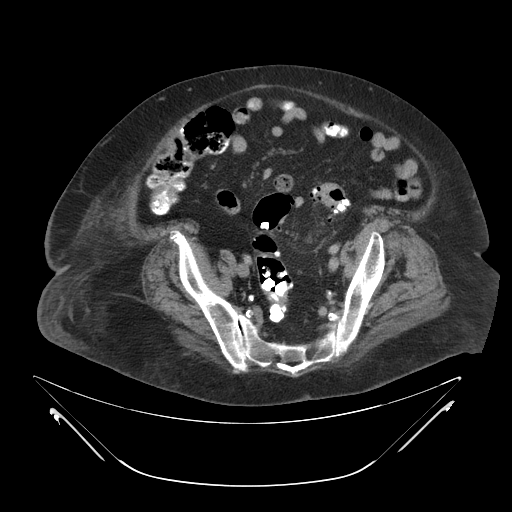
[im 57/71  soft-tissue]
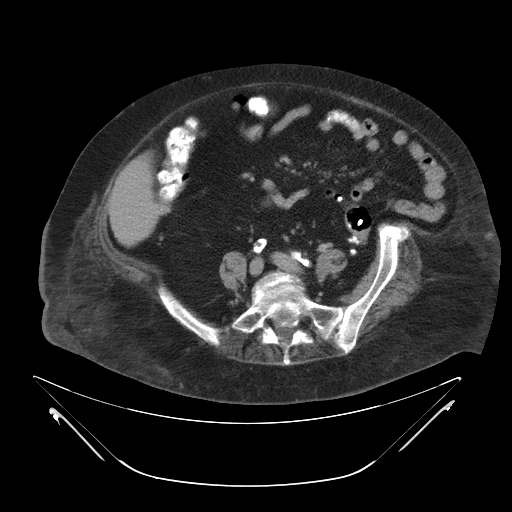
[im 66/71  soft-tissue]
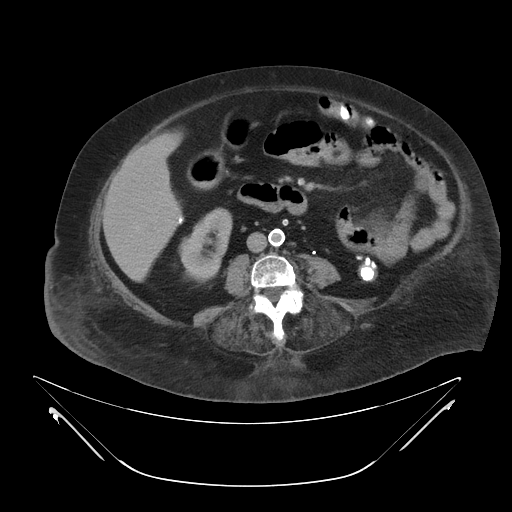

[Series 203: coronals, idose (2) · coronal · 0.45mm/px · 3 of 149 slices shown]
[im 50/149  soft-tissue]
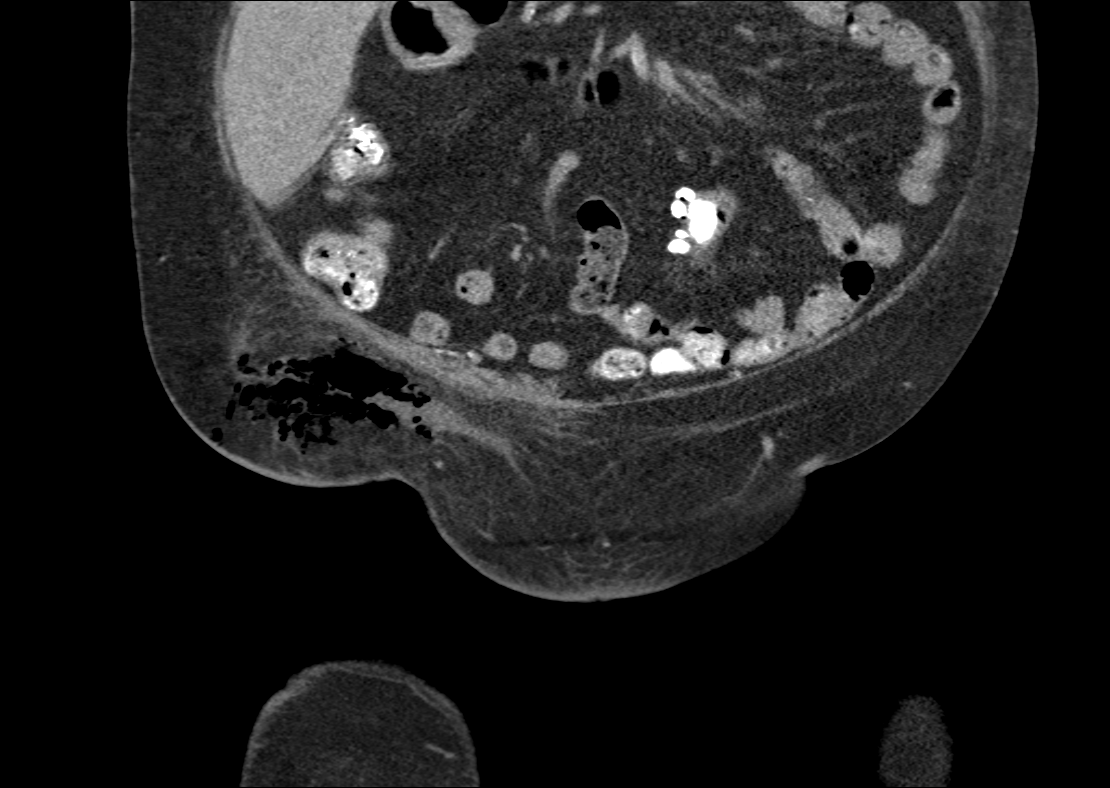
[im 66/149  soft-tissue]
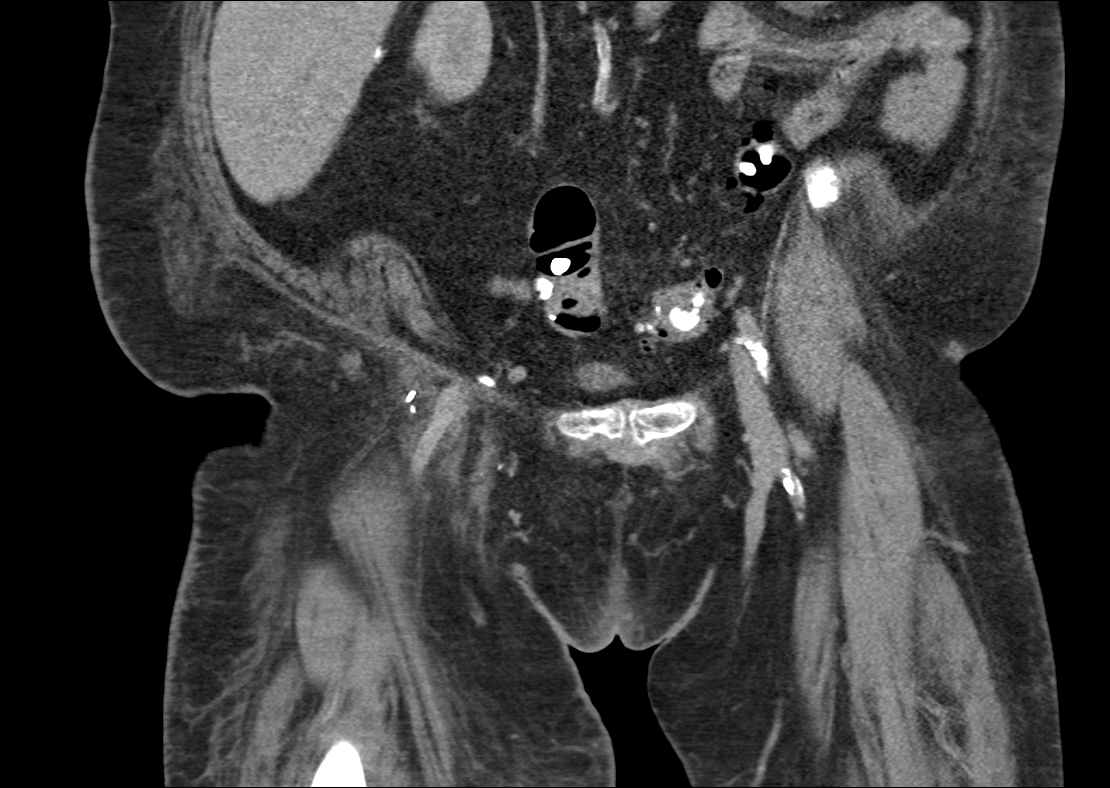
[im 83/149  soft-tissue]
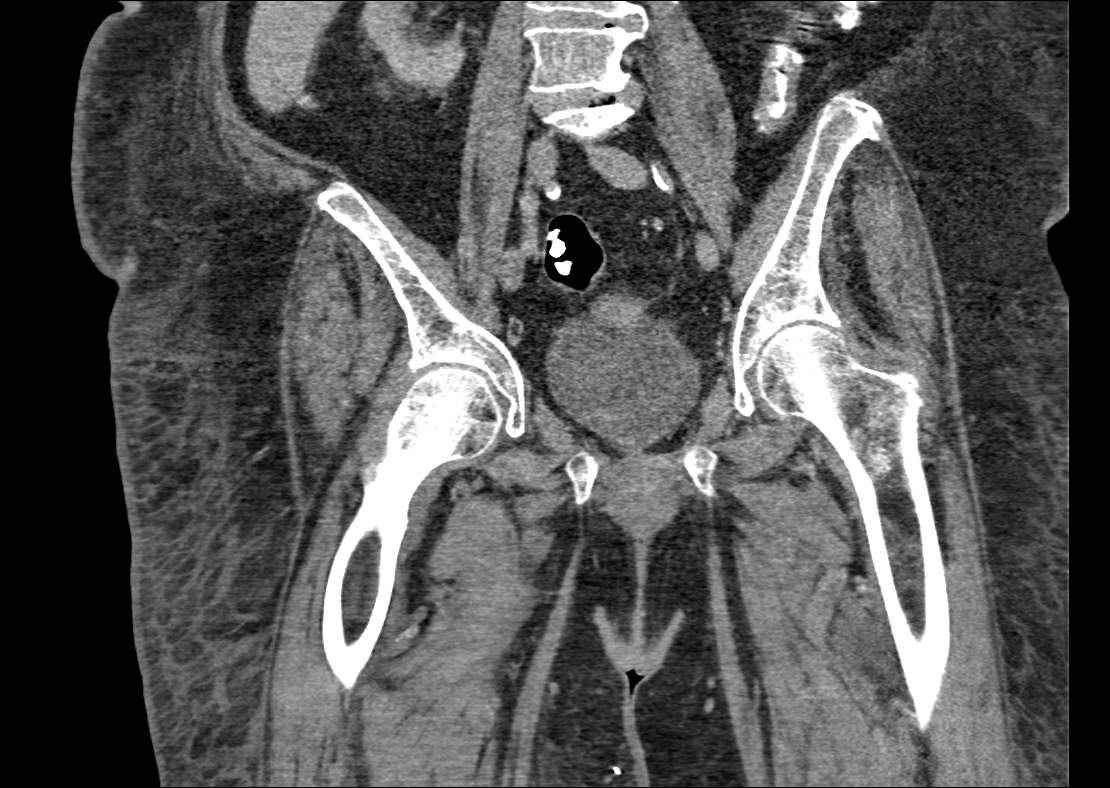

[11 of 46 positions shown; findings below may reference images not displayed]

FINDINGS: There is soft tissue emphysema clustered in the right groin and
lower quadrant subcutaneous fat, extending to but not involving the
abdominal wall musculature. There is no measurable fluid collection.
No interfascial gas or stranding. Cellulitis surrounds the
emphysematous area.

No intra-abdominal inflammatory changes are noted. Extensive
diverticulosis.

Patient's right common femoral artery bypass graft is noted.

These results were called by telephone at the time of interpretation
on 11/10/2015 at [DATE] to Dr. VANIA FOO , who verbally
acknowledged these results.
IMPRESSION: Soft tissue emphysema/necrotizing infection in the right groin and
lower quadrant subcutaneous fat.
